# Patient Record
Sex: Male | Born: 1960
Health system: Southern US, Community
[De-identification: ages and names within clinical notes are randomized; demographics above are authoritative.]

## PROBLEM LIST (undated history)

## (undated) DIAGNOSIS — I10 Essential (primary) hypertension: Secondary | ICD-10-CM

## (undated) DIAGNOSIS — C61 Malignant neoplasm of prostate: Secondary | ICD-10-CM

## (undated) DIAGNOSIS — K449 Diaphragmatic hernia without obstruction or gangrene: Secondary | ICD-10-CM

## (undated) DIAGNOSIS — Q393 Congenital stenosis and stricture of esophagus: Secondary | ICD-10-CM

## (undated) DIAGNOSIS — K222 Esophageal obstruction: Secondary | ICD-10-CM

## (undated) HISTORY — PX: LEG AMPUTATION ABOVE KNEE: SHX117

## (undated) HISTORY — PX: PROSTATECTOMY: SHX69

## (undated) HISTORY — PX: HERNIA REPAIR: SHX51

---

## 2006-06-24 ENCOUNTER — Encounter: Admission: RE | Admit: 2006-06-24 | Discharge: 2006-06-24 | Payer: Self-pay | Admitting: Orthopedic Surgery

## 2006-07-13 ENCOUNTER — Encounter: Admission: RE | Admit: 2006-07-13 | Discharge: 2006-07-13 | Payer: Self-pay | Admitting: Orthopedic Surgery

## 2006-08-04 ENCOUNTER — Ambulatory Visit (HOSPITAL_COMMUNITY): Admission: RE | Admit: 2006-08-04 | Discharge: 2006-08-04 | Payer: Self-pay | Admitting: Orthopedic Surgery

## 2006-08-23 ENCOUNTER — Encounter: Admission: RE | Admit: 2006-08-23 | Discharge: 2006-09-30 | Payer: Self-pay | Admitting: Orthopedic Surgery

## 2006-12-07 ENCOUNTER — Encounter: Admission: RE | Admit: 2006-12-07 | Discharge: 2006-12-07 | Payer: Self-pay | Admitting: Family Medicine

## 2006-12-09 ENCOUNTER — Ambulatory Visit (HOSPITAL_COMMUNITY): Admission: RE | Admit: 2006-12-09 | Discharge: 2006-12-09 | Payer: Self-pay | Admitting: Orthopedic Surgery

## 2007-01-17 ENCOUNTER — Encounter: Admission: RE | Admit: 2007-01-17 | Discharge: 2007-01-17 | Payer: Self-pay | Admitting: Orthopedic Surgery

## 2009-04-04 ENCOUNTER — Encounter (INDEPENDENT_AMBULATORY_CARE_PROVIDER_SITE_OTHER): Payer: Self-pay | Admitting: Urology

## 2009-04-04 ENCOUNTER — Inpatient Hospital Stay (HOSPITAL_COMMUNITY): Admission: RE | Admit: 2009-04-04 | Discharge: 2009-04-06 | Payer: Self-pay | Admitting: Urology

## 2011-01-11 LAB — CBC
HCT: 42.4 % (ref 39.0–52.0)
Hemoglobin: 14.1 g/dL (ref 13.0–17.0)
RDW: 14.9 % (ref 11.5–15.5)
WBC: 4.3 10*3/uL (ref 4.0–10.5)

## 2011-01-11 LAB — HEMOGLOBIN AND HEMATOCRIT, BLOOD
HCT: 31.2 % — ABNORMAL LOW (ref 39.0–52.0)
HCT: 33.7 % — ABNORMAL LOW (ref 39.0–52.0)
HCT: 36.5 % — ABNORMAL LOW (ref 39.0–52.0)
Hemoglobin: 10.4 g/dL — ABNORMAL LOW (ref 13.0–17.0)

## 2011-01-12 LAB — COMPREHENSIVE METABOLIC PANEL
ALT: 43 U/L (ref 0–53)
AST: 35 U/L (ref 0–37)
CO2: 26 mEq/L (ref 19–32)
Chloride: 106 mEq/L (ref 96–112)
GFR calc Af Amer: >60 mL/min (ref >60)
GFR calc non Af Amer: >60 mL/min (ref >60)
Glucose, Bld: 99 mg/dL (ref 70–99)
Sodium: 140 mEq/L (ref 135–145)
Total Bilirubin: 1 mg/dL (ref 0.3–1.2)

## 2011-01-12 LAB — TYPE AND SCREEN: ABO/RH(D): A POS

## 2011-01-12 LAB — HEMOGLOBIN AND HEMATOCRIT, BLOOD: Hemoglobin: 13.9 g/dL (ref 13.0–17.0)
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
## 2011-02-20 NOTE — Discharge Summary (Signed)
NAMETEAGUE, GOYNES               ACCOUNT NO.:  [id number]   MEDICAL RECORD NO.:  [id number]          PATIENT TYPE:  INP   LOCATION:  1438                         FACILITY:  [facility]   PHYSICIAN:  Valetta Fuller, M.D.  DATE OF BIRTH:  25-Jul-1961   DATE OF ADMISSION:  04/04/2009  DATE OF DISCHARGE:  04/06/2009                               DISCHARGE SUMMARY   ADMISSION DIAGNOSIS:  Clinical stage T1c adenocarcinoma of the prostate.   DISCHARGE DIAGNOSIS:  Clinical stage T1c adenocarcinoma of the prostate.   PROCEDURES:  Robotic assisted laparoscopic radical retropubic  prostatectomy.   HISTORY AND PHYSICAL:  For full details please see admission history and  physical.  Briefly, Mr Swatzell is a 50 year old gentleman who was found  to have clinically localized adenocarcinoma of the prostate.  After  careful consideration regarding management options for treatment he  elected to proceed with surgical therapy and a robotic assisted  laparoscopic radical prostatectomy.   HOSPITAL COURSE:  On April 04, 2009 he was taken to the operating room  where he underwent the above named procedure and which he tolerated well  without complications.  Postoperatively he was able to be transferred to  a regular hospital room following recovery from anesthesia.  He was able  to begin ambulation that evening.  He remained hemodynamically stable.  His postoperative hemoglobin was 12.1.  On the morning of postoperative  day #1 his hemoglobin had slightly fallen to 10.4.  Therefore his  hemoglobin was rechecked later that afternoon and found to be stable at  11.2, suggesting the variance was dilutional.  He maintained excellent  urine output with minimal output from his pelvic drain, therefore the  pelvic drain was removed.  He was placed on a clear liquid diet and  continued to ambulate.  He was re-evaluated on the afternoon of  postoperative day #1.  His urine output and blood pressure and pulse all  remained stable.  He was able to tolerate his clear liquid diet without  nausea or vomiting.  He was having somewhat moderate to severe abdominal  distention and pain, therefore it was decided that he would stay one  more night for observation and pain control.  He was re-evaluated on the  morning of postoperative day #2 and pain had somewhat relieved.  Therefore, he was felt to be stable for discharge as he had met all  discharge criteria.   DISPOSITION:  Home.   DISCHARGE MEDICATIONS:  He was instructed to resume his regular home  medications.  In addition he was provided a prescription for Vicodin to  use for pain, told to use Colace as a stool softener.  He was also  provided a prescription for Cipro to begin 2 days prior to followup  appointment for removal of Foley catheter.   DISCHARGE INSTRUCTIONS:  He was instructed to be ambulatory, but  specifically told to refrain from any heavy lifting, strenuous activity  or driving.  He was instructed on routine Foley catheter care and told  to gradually advance his diet over the course of the next few days.  FOLLOWUP:  He will follow up in 1 week for removal of Foley catheter and  skin staples.      Delia Chimes, NP      Valetta Fuller, M.D.  Electronically Signed    MA/MEDQ  D:  04/09/2009  T:  04/09/2009  Job:  [id number]

## 2011-02-20 NOTE — Op Note (Signed)
Evan Chambers, Evan Chambers               ACCOUNT NO.:  [id number]   MEDICAL RECORD NO.:  [id number]          PATIENT TYPE:  AMB   LOCATION:  SDS                          FACILITY:  [facility]   PHYSICIAN:  Myrtie Neither, MD      DATE OF BIRTH:  December 30, 1960   DATE OF PROCEDURE:  08/04/2006  DATE OF DISCHARGE:                                 OPERATIVE REPORT   PREOPERATIVE DIAGNOSIS:  Impingement syndrome, left shoulder.   POSTOPERATIVE DIAGNOSIS:  Impingement syndrome, left shoulder.   ANESTHESIA:  General.   PROCEDURE:  Arthroscopic acromioplasty and decompression and synovectomy,  left shoulder.   The patient taken to operating room.  After given adequate preop medications  and scalenus block, left shoulder prepped with DuraPrep and draped in  sterile manner.  A 1/2 inch puncture wound made posteriorly.  Swisher rod  was placed posterior to anterior, anterior inflow water, incision was then  made.  A separate lateral incision made for the shaver.  Inspection of the  joint revealed tremendous hypertrophic overgrowth of the subacromial bursal  sac and chondromalacia changes of the subacromial surface with complete loss  of subacromial space.  With synovial shaver, complete synovectomy was done  followed by acromioplasty with use of the arthroscopic shaver.  After  adequate decompression coracohumeral ligament was also released.  Rotator  cuff itself showed some __________ but was not torn.  After adequate  decompression and synovectomy, wound closure was then done with four nylon.  Compressive dressing was applied.  The patient tolerated procedure quite  well, went to recovery room in stable and satisfactory position.  The  patient being discharged home to return to office in one week.  The patient  being discharged on Percocet 5 mg one q. 4 p.r.n. for pain, ice packs, use  of immobilizing sling and to return to the office in one week.  The patient  discharged in stable and satisfactory  condition.      Myrtie Neither, MD  Electronically Signed     AC/MEDQ  D:  08/04/2006  T:  08/04/2006  Job:  [id number]

## 2012-01-08 ENCOUNTER — Emergency Department (HOSPITAL_BASED_OUTPATIENT_CLINIC_OR_DEPARTMENT_OTHER)
Admission: EM | Admit: 2012-01-08 | Discharge: 2012-01-08 | Disposition: A | Discharge status: Home / Self Care | Payer: Worker's Compensation | Attending: Emergency Medicine | Admitting: Emergency Medicine

## 2012-01-08 ENCOUNTER — Encounter (HOSPITAL_BASED_OUTPATIENT_CLINIC_OR_DEPARTMENT_OTHER): Payer: Self-pay | Admitting: *Deleted

## 2012-01-08 DIAGNOSIS — S88119A Complete traumatic amputation at level between knee and ankle, unspecified lower leg, initial encounter: Secondary | ICD-10-CM | POA: Insufficient documentation

## 2012-01-08 DIAGNOSIS — S0990XA Unspecified injury of head, initial encounter: Secondary | ICD-10-CM | POA: Insufficient documentation

## 2012-01-08 DIAGNOSIS — W208XXA Other cause of strike by thrown, projected or falling object, initial encounter: Secondary | ICD-10-CM | POA: Insufficient documentation

## 2012-01-08 DIAGNOSIS — Y990 Civilian activity done for income or pay: Secondary | ICD-10-CM | POA: Insufficient documentation

## 2012-01-08 DIAGNOSIS — I10 Essential (primary) hypertension: Secondary | ICD-10-CM | POA: Insufficient documentation

## 2012-01-08 HISTORY — DX: Essential (primary) hypertension: I10

## 2012-01-08 MED ORDER — MECLIZINE HCL 25 MG PO TABS
25.0000 mg | ORAL_TABLET | Freq: Once | ORAL | Status: AC
  Administered 2012-01-08: 25 mg via ORAL
  Filled 2012-01-08: qty 1
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
## 2012-01-08 NOTE — Discharge Instructions (Signed)
Head Injury, Adult  You have had a head injury that does not appear serious at this time. A concussion is a state of changed mental ability, usually from a blow to the head. You should take clear liquids for the rest of the day and then resume your regular diet. You should not take sedatives or alcoholic beverages for as long as directed by your caregiver after discharge. After injuries such as yours, most problems occur within the first 24 hours.  SYMPTOMS  These minor symptoms may be experienced after discharge:  · Memory difficulties.  · Dizziness.  · Headaches.  · Double vision.  · Hearing difficulties.  · Depression.  · Tiredness.  · Weakness.  · Difficulty with concentration.  If you experience any of these problems, you should not be alarmed. A concussion requires a few days for recovery. Many patients with head injuries frequently experience such symptoms. Usually, these problems disappear without medical care. If symptoms last for more than one day, notify your caregiver. See your caregiver sooner if symptoms are becoming worse rather than better.  HOME CARE INSTRUCTIONS   · During the next 24 hours you must stay with someone who can watch you for the warning signs listed below.  Although it is unlikely that serious side effects will occur, you should be aware of signs and symptoms which may necessitate your return to this location. Side effects may occur up to 7 - 10 days following the injury. It is important for you to carefully monitor your condition and contact your caregiver or seek immediate medical attention if there is a change in your condition.  SEEK IMMEDIATE MEDICAL CARE IF:   · There is confusion or drowsiness.  · You can not awaken the injured person.  · There is nausea (feeling sick to your stomach) or continued, forceful vomiting.  · You notice dizziness or unsteadiness which is getting worse, or inability to walk.  · You have convulsions or unconsciousness.  · You experience severe,  persistent headaches not relieved by over-the-counter or prescription medicines for pain. (Do not take aspirin as this impairs clotting abilities). Take other pain medications only as directed.  · You can not use arms or legs normally.  · There is clear or bloody discharge from the nose or ears.  MAKE SURE YOU:   · Understand these instructions.  · Will watch your condition.  · Will get help right away if you are not doing well or get worse.  Document Released: 09/21/2005 Document Revised: 09/10/2011 Document Reviewed: 08/09/2009  [employer]® Patient Information ©2012 [employer].

## 2012-01-08 NOTE — ED Notes (Signed)
UDS for workers comp completed

## 2014-05-03 ENCOUNTER — Ambulatory Visit
Admission: RE | Admit: 2014-05-03 | Discharge: 2014-05-03 | Disposition: A | Discharge status: Home / Self Care | Payer: BC Managed Care – PPO | Source: Ambulatory Visit | Attending: Orthopedic Surgery | Admitting: Orthopedic Surgery

## 2014-05-03 ENCOUNTER — Other Ambulatory Visit: Payer: Self-pay | Admitting: Orthopedic Surgery

## 2014-05-03 DIAGNOSIS — M25512 Pain in left shoulder: Secondary | ICD-10-CM

## 2014-05-03 DIAGNOSIS — R609 Edema, unspecified: Secondary | ICD-10-CM

## 2014-05-03 DIAGNOSIS — M25561 Pain in right knee: Secondary | ICD-10-CM

## 2014-05-03 DIAGNOSIS — M5412 Radiculopathy, cervical region: Secondary | ICD-10-CM

## 2014-05-17 ENCOUNTER — Other Ambulatory Visit: Payer: Self-pay | Admitting: Orthopedic Surgery

## 2014-05-17 DIAGNOSIS — M542 Cervicalgia: Secondary | ICD-10-CM

## 2014-05-24 ENCOUNTER — Ambulatory Visit
Admission: RE | Admit: 2014-05-24 | Discharge: 2014-05-24 | Disposition: A | Discharge status: Home / Self Care | Payer: BC Managed Care – PPO | Source: Ambulatory Visit | Attending: Orthopedic Surgery | Admitting: Orthopedic Surgery

## 2014-05-24 DIAGNOSIS — M542 Cervicalgia: Secondary | ICD-10-CM

## 2016-11-14 ENCOUNTER — Encounter (HOSPITAL_COMMUNITY): Payer: Self-pay | Admitting: *Deleted

## 2016-11-14 ENCOUNTER — Emergency Department (HOSPITAL_COMMUNITY): Payer: Medicare Other

## 2016-11-14 ENCOUNTER — Observation Stay (HOSPITAL_COMMUNITY)
Admission: EM | Admit: 2016-11-14 | Discharge: 2016-11-15 | Disposition: A | Discharge status: Home / Self Care | Payer: Medicare Other | Attending: Internal Medicine | Admitting: Internal Medicine

## 2016-11-14 DIAGNOSIS — I10 Essential (primary) hypertension: Secondary | ICD-10-CM | POA: Diagnosis not present

## 2016-11-14 DIAGNOSIS — R780 Finding of alcohol in blood: Secondary | ICD-10-CM

## 2016-11-14 DIAGNOSIS — Y906 Blood alcohol level of 120-199 mg/100 ml: Secondary | ICD-10-CM

## 2016-11-14 DIAGNOSIS — E876 Hypokalemia: Secondary | ICD-10-CM

## 2016-11-14 DIAGNOSIS — R079 Chest pain, unspecified: Secondary | ICD-10-CM | POA: Diagnosis not present

## 2016-11-14 DIAGNOSIS — R0602 Shortness of breath: Secondary | ICD-10-CM | POA: Diagnosis not present

## 2016-11-14 DIAGNOSIS — R739 Hyperglycemia, unspecified: Secondary | ICD-10-CM | POA: Diagnosis not present

## 2016-11-14 DIAGNOSIS — Z8546 Personal history of malignant neoplasm of prostate: Secondary | ICD-10-CM

## 2016-11-14 LAB — CBC
HEMATOCRIT: 37.9 % — AB (ref 39.0–52.0)
Hemoglobin: 13 g/dL (ref 13.0–17.0)
MCH: 30.1 pg (ref 26.0–34.0)
MCHC: 34.3 g/dL (ref 30.0–36.0)
MCV: 87.7 fL (ref 78.0–100.0)
PLATELETS: 154 10*3/uL (ref 150–400)
RBC: 4.32 MIL/uL (ref 4.22–5.81)
RDW: 15.4 % (ref 11.5–15.5)
WBC: 5.6 10*3/uL (ref 4.0–10.5)

## 2016-11-14 LAB — D-DIMER, QUANTITATIVE: D-Dimer, Quant: <0.27 ug/mL-FEU (ref 0.00–0.50)

## 2016-11-14 LAB — BASIC METABOLIC PANEL
Anion gap: 18 — ABNORMAL HIGH (ref 5–15)
BUN: 5 mg/dL — AB (ref 6–20)
CHLORIDE: 99 mmol/L — AB (ref 101–111)
CO2: 20 mmol/L — AB (ref 22–32)
CREATININE: 1.05 mg/dL (ref 0.61–1.24)
Calcium: 9.2 mg/dL (ref 8.9–10.3)
GFR calc Af Amer: >60 mL/min (ref >60)
GFR calc non Af Amer: >60 mL/min (ref >60)
Glucose, Bld: 160 mg/dL — ABNORMAL HIGH (ref 65–99)
POTASSIUM: 3 mmol/L — AB (ref 3.5–5.1)
SODIUM: 137 mmol/L (ref 135–145)

## 2016-11-14 LAB — URINALYSIS, ROUTINE W REFLEX MICROSCOPIC
BILIRUBIN URINE: NEGATIVE
Glucose, UA: NEGATIVE mg/dL
HGB URINE DIPSTICK: NEGATIVE
KETONES UR: NEGATIVE mg/dL
Leukocytes, UA: NEGATIVE
NITRITE: NEGATIVE
PROTEIN: NEGATIVE mg/dL
SPECIFIC GRAVITY, URINE: 1.01 (ref 1.005–1.030)
pH: 6 (ref 5.0–8.0)

## 2016-11-14 LAB — LIPID PANEL
CHOL/HDL RATIO: 3 ratio
Cholesterol: 236 mg/dL — ABNORMAL HIGH (ref 0–200)
HDL: 80 mg/dL (ref >40)
LDL Cholesterol: 141 mg/dL — ABNORMAL HIGH (ref 0–99)
Triglycerides: 74 mg/dL (ref <150)
VLDL: 15 mg/dL (ref 0–40)

## 2016-11-14 LAB — TROPONIN I
Troponin I: <0.03 ng/mL (ref <0.03)
Troponin I: <0.03 ng/mL (ref <0.03)
Troponin I: <0.03 ng/mL (ref <0.03)

## 2016-11-14 LAB — ETHANOL: ALCOHOL ETHYL (B): 138 mg/dL — AB (ref <5)

## 2016-11-14 MED ORDER — LORAZEPAM 1 MG PO TABS: 1.0000 mg | ORAL_TABLET | Freq: Four times a day (QID) | ORAL | Status: DC | PRN

## 2016-11-14 MED ORDER — MORPHINE SULFATE (PF) 4 MG/ML IV SOLN: 1.0000 mg | INTRAVENOUS | Status: DC | PRN

## 2016-11-14 MED ORDER — ASPIRIN 81 MG PO CHEW
324.0000 mg | CHEWABLE_TABLET | Freq: Once | ORAL | Status: AC
  Administered 2016-11-14: 324 mg via ORAL
  Filled 2016-11-14: qty 4

## 2016-11-14 MED ORDER — NITROGLYCERIN 0.4 MG SL SUBL
0.4000 mg | SUBLINGUAL_TABLET | SUBLINGUAL | Status: DC | PRN
  Administered 2016-11-14: 0.4 mg via SUBLINGUAL
  Filled 2016-11-14: qty 1

## 2016-11-14 MED ORDER — ADULT MULTIVITAMIN W/MINERALS CH
1.0000 | ORAL_TABLET | Freq: Every day | ORAL | Status: DC
  Administered 2016-11-14 – 2016-11-15 (×2): 1 via ORAL
  Filled 2016-11-14 (×2): qty 1

## 2016-11-14 MED ORDER — VITAMIN B-1 100 MG PO TABS
100.0000 mg | ORAL_TABLET | Freq: Every day | ORAL | Status: DC
  Administered 2016-11-14 – 2016-11-15 (×2): 100 mg via ORAL
  Filled 2016-11-14 (×2): qty 1

## 2016-11-14 MED ORDER — SODIUM CHLORIDE 0.9 % IV BOLUS (SEPSIS)
1000.0000 mL | Freq: Once | INTRAVENOUS | Status: AC
  Administered 2016-11-14: 1000 mL via INTRAVENOUS

## 2016-11-14 MED ORDER — ASPIRIN EC 325 MG PO TBEC: 325.0000 mg | DELAYED_RELEASE_TABLET | Freq: Every day | ORAL | Status: DC

## 2016-11-14 MED ORDER — INFLUENZA VAC SPLIT QUAD 0.5 ML IM SUSY
0.5000 mL | PREFILLED_SYRINGE | INTRAMUSCULAR | Status: AC
  Administered 2016-11-15: 0.5 mL via INTRAMUSCULAR
  Filled 2016-11-14: qty 0.5

## 2016-11-14 MED ORDER — POTASSIUM CHLORIDE CRYS ER 20 MEQ PO TBCR
40.0000 meq | EXTENDED_RELEASE_TABLET | Freq: Once | ORAL | Status: AC
  Administered 2016-11-14: 40 meq via ORAL
  Filled 2016-11-14: qty 2

## 2016-11-14 MED ORDER — THIAMINE HCL 100 MG/ML IJ SOLN: 100.0000 mg | Freq: Every day | INTRAMUSCULAR | Status: DC

## 2016-11-14 MED ORDER — ASPIRIN 81 MG PO CHEW
324.0000 mg | CHEWABLE_TABLET | Freq: Every day | ORAL | Status: DC
  Administered 2016-11-14 – 2016-11-15 (×2): 324 mg via ORAL
  Filled 2016-11-14 (×3): qty 4

## 2016-11-14 MED ORDER — ONDANSETRON HCL 4 MG/2ML IJ SOLN: 4.0000 mg | Freq: Four times a day (QID) | INTRAMUSCULAR | Status: DC | PRN

## 2016-11-14 MED ORDER — ENOXAPARIN SODIUM 40 MG/0.4ML ~~LOC~~ SOLN
40.0000 mg | SUBCUTANEOUS | Status: DC
  Administered 2016-11-14: 40 mg via SUBCUTANEOUS
  Filled 2016-11-14: qty 0.4

## 2016-11-14 MED ORDER — GI COCKTAIL ~~LOC~~: 30.0000 mL | Freq: Four times a day (QID) | ORAL | Status: DC | PRN

## 2016-11-14 MED ORDER — FAMOTIDINE 20 MG PO TABS
40.0000 mg | ORAL_TABLET | Freq: Once | ORAL | Status: AC
  Administered 2016-11-14: 40 mg via ORAL
  Filled 2016-11-14: qty 2

## 2016-11-14 MED ORDER — FOLIC ACID 1 MG PO TABS
1.0000 mg | ORAL_TABLET | Freq: Every day | ORAL | Status: DC
  Administered 2016-11-14 – 2016-11-15 (×2): 1 mg via ORAL
  Filled 2016-11-14 (×2): qty 1

## 2016-11-14 MED ORDER — ACETAMINOPHEN 325 MG PO TABS: 650.0000 mg | ORAL_TABLET | ORAL | Status: DC | PRN

## 2016-11-14 MED ORDER — LORAZEPAM 2 MG/ML IJ SOLN: 1.0000 mg | Freq: Four times a day (QID) | INTRAMUSCULAR | Status: DC | PRN

## 2016-11-14 MED ORDER — PROMETHAZINE HCL 25 MG/ML IJ SOLN: 12.5000 mg | Freq: Four times a day (QID) | INTRAMUSCULAR | Status: DC | PRN

## 2016-11-14 NOTE — H&P (Signed)
History and Physical    DAMASCUS BLOT [id number] DOB: [birth year] DOA: 11/14/2016   PCP: Elyn Peers, MD   Patient coming from:  Home    Chief Complaint: Chest pain   HPI: Evan Chambers is a 56 y.o. male with medical history significant for HTN presenting to the ED with intermittent, lower sternal chest pain, beginning its onset on 11/13/2016 in the afternoon. He describes this episodes as moderate severe, lasting about 2 minutes, nonradiating. This is unchanged with eating or with movement. He denies any nausea vomiting, or diaphoresis. He denies any left jaw or left arm pain. He denies any pain with deep inspiration. He denies any dizziness or falls. No syncope or presyncope. He has some acute dyspnea with this chest pain, but without cough. He denies any fever or chills or sick contacts. His appetite is normal, but he admits to eating salt rich foods. He denies any right leg swelling or calf pain ( LAKA) . He denies any headaches or vision changes. No seizures. No confusion is reported. He was never seen by a cardiologist, or had a cardiac catheterization. He denies any recent long distance trips, new stressors, or new medicines. He is not on hormonal therapy or herbal supplements. He does not smoke. He does admit to being a social drinker. His last episode was yesterday around 4:56 PM, drinking a few beers. He denies any recreational drug use.   ED Course:  BP 135/95   Pulse 99   Temp 99.1 F (37.3 C) (Oral)   Resp 17   Ht 5\' 6"  (1.676 m)   Wt 72.6 kg (160 lb)   SpO2 98%   BMI 25.82 kg/m     sodium 137 potassium 3.0 creatinine 1.05  troponin less than 0.03  white count 5.6 hemoglobin 13 platelets 150  D dimer less than 0.27  glucose 160  alcohol 138  chest x-ray NAD EKG sinus rhythm  QTc 589, likely artifact  Received 2 NTG and 1 ASA with pain relief. He is pain free at this time   Review of Systems: As per HPI otherwise 10 point review of systems negative.   Past  Medical History:  Diagnosis Date  . Hypertension     Past Surgical History:  Procedure Laterality Date  . HERNIA REPAIR    . LEG AMPUTATION ABOVE KNEE    . PROSTATECTOMY      Social History Social History   Social History  . Marital status: Single    Spouse name: N/A  . Number of children: N/A  . Years of education: N/A   Occupational History  . unemployed    Social History Main Topics  . Smoking status: Never Smoker  . Smokeless tobacco: Never Used  . Alcohol use Yes     Comment: drinks socially   . Drug use: No  . Sexual activity: Yes   Other Topics Concern  . Not on file   Social History Narrative  . No narrative on file     No Known Allergies  Family History  Problem Relation Age of Onset  . Hypertension Mother   . Hypertension Father       Prior to Admission medications   Not on File    Physical Exam:  Vitals:   11/14/16 0410 11/14/16 0430 11/14/16 0515 11/14/16 0545  BP:  145/99 148/87 135/95  Pulse:  110 95 99  Resp:  11 13 17   Temp: 99.1 F (37.3 C)     TempSrc:  Oral     SpO2:  97% 98% 98%  Weight:      Height:       Constitutional: NAD, calm, comfortable   Eyes: PERRL, lids and conjunctivae normal ENMT: Mucous membranes are moist, without exudate or lesions  Neck: normal, supple, no masses, no thyromegaly Respiratory: clear to auscultation bilaterally, no wheezing, no crackles. Normal respiratory effort  Cardiovascular: Regular rate and rhythm, no murmurs / rubs / gallops. No extremity edema on the R . 2+ pedal pulses. No carotid bruits.  Abdomen: Soft, non tender, No hepatosplenomegaly. Bowel sounds positive.  Musculoskeletal: no clubbing / cyanosis. Moves all extremities. L AKA  Skin: no jaundice, No lesions.  Neurologic: Sensation intact  Strength normal  Psychiatric:   Alert and oriented x 3. Normal mood.     Labs on Admission: I have personally reviewed following labs and imaging studies  CBC:  Recent Labs Lab  11/14/16 0326  WBC 5.6  HGB 13.0  HCT 37.9*  MCV 87.7  PLT 123456    Basic Metabolic Panel:  Recent Labs Lab 11/14/16 0326  NA 137  K 3.0*  CL 99*  CO2 20*  GLUCOSE 160*  BUN 5*  CREATININE 1.05  CALCIUM 9.2    GFR: Estimated Creatinine Clearance: 71.7 mL/min (by C-G formula based on SCr of 1.05 mg/dL).  Liver Function Tests: No results for input(s): AST, ALT, ALKPHOS, BILITOT, PROT, ALBUMIN in the last 168 hours. No results for input(s): LIPASE, AMYLASE in the last 168 hours. No results for input(s): AMMONIA in the last 168 hours.  Coagulation Profile: No results for input(s): INR, PROTIME in the last 168 hours.  Cardiac Enzymes:  Recent Labs Lab 11/14/16 0326  TROPONINI <0.03    BNP (last 3 results) No results for input(s): PROBNP in the last 8760 hours.  HbA1C: No results for input(s): HGBA1C in the last 72 hours.  CBG: No results for input(s): GLUCAP in the last 168 hours.  Lipid Profile: No results for input(s): CHOL, HDL, LDLCALC, TRIG, CHOLHDL, LDLDIRECT in the last 72 hours.  Thyroid Function Tests: No results for input(s): TSH, T4TOTAL, FREET4, T3FREE, THYROIDAB in the last 72 hours.  Anemia Panel: No results for input(s): VITAMINB12, FOLATE, FERRITIN, TIBC, IRON, RETICCTPCT in the last 72 hours.  Urine analysis: No results found for: COLORURINE, APPEARANCEUR, LABSPEC, PHURINE, GLUCOSEU, HGBUR, BILIRUBINUR, KETONESUR, PROTEINUR, UROBILINOGEN, NITRITE, LEUKOCYTESUR  Sepsis Labs: @LABRCNTIP (procalcitonin:4,lacticidven:4) )No results found for this or any previous visit (from the past 240 hour(s)).   Radiological Exams on Admission: Dg Chest 2 View  Result Date: 11/14/2016 CLINICAL DATA:  Shortness of breath since this afternoon. History of hypertension. Chest pain for 1 hour. Nonsmoker. EXAM: CHEST  2 VIEW COMPARISON:  04/02/2009 FINDINGS: The heart size and mediastinal contours are within normal limits. Both lungs are clear. The visualized  skeletal structures are unremarkable. IMPRESSION: No active cardiopulmonary disease. Electronically Signed   By: Lucienne Capers M.D.   On: 11/14/2016 04:06    EKG: Independently reviewed.  Assessment/Plan Active Problems:   Chest pain   Elevated ETOH level   Hypokalemia   Elevated blood sugar level   Personal history of prostate cancer    Chest pain syndrome  HEART score 3. Troponin neg , EKG SR  K being corrected, receiving 40 meq at the ED po. CP nrelieved by nitroglycerin, aspirin. CXR unrevealing.   Risk factors include HTN, age. No family history of cardiac disease. He is chest pain free at this time   Admit  to Telemetry/ Observation Chest pain order set Cycle troponins EKG in am continue ASA, O2 and NTG as needed GI cocktail Check Lipid panel  Hb A1C Patient may need further evaluation as outpatien with Cards.   Prolonged QTc: likely artifact  No arrhythmias no telemetry.  Serial EKG    Alcohol intoxication, last level at 138 after social drinking. Denies h/o ETOH abuse     -  CIWA with Ativan per protocol -  Thiamine, folate, and MVI   Hypokalemia, may be due to increased diuresis after ETOH consumption,  EKG SR,  Initial K 3, received replenishment at the ED with 40 meq   Oral replenishment as needed Repeat CMET in am   Elevated blood sugar level is 160, no h/o DM  No results found for: HGBA1C Hgb A1C.  History of prostate Ca  Gleason 3 (95%) + 4 (5%) = 7, pT2c, pNX, pMX , dx 2010  Not on therapy   DVT prophylaxis: Lovenox   Code Status:   Full     Family Communication:  Discussed with patient Disposition Plan: Expect patient to be discharged to home after condition improves Consults called:    None Admission status:Tele  Obs   Lailani Tool E, PA-C [facility]   11/14/2016, 7:54 AM

## 2016-11-14 NOTE — ED Provider Notes (Signed)
[facility] DEPT Provider Note   CSN: [id number] Arrival date & time: 11/14/16  [id number] By signing my name below, I, Evan Chambers, attest that this documentation has been prepared under the direction and in the presence of Evan Gambler, MD . Electronically Signed: Dyke Chambers, Scribe. 11/14/2016. 3:57 AM.  History   Chief Complaint Chief Complaint  Patient presents with  . Chest Pain    HPI Evan Chambers is a 56 y.o. male with a hx of HTN who presents to the Emergency Department complaining of gradually worsening, intermittent, central chest pain onset yesterday afternoon. Pt describes his pain as 7/10, pinching chest pain that does not radiate and is unchanged by eating or with movement. He notes associated mild SOB. He denies any tobacco or drug use, but endorses occasional alcohol use "on the weekends". Per pt, he had a few shots at 4-5 pm yesterday. No personal or family cardiac history. No PMHx of blood clots. No recent illness. Pt denies any leg swelling, nausea, vomiting, or any other associated symptoms.   The history is provided by the patient. No language interpreter was used.    Past Medical History:  Diagnosis Date  . Hypertension     Patient Active Problem List   Diagnosis Date Noted  . Chest pain 11/14/2016    Past Surgical History:  Procedure Laterality Date  . HERNIA REPAIR    . LEG AMPUTATION ABOVE KNEE    . PROSTATECTOMY      Home Medications    Prior to Admission medications   Not on File   Family History No family history on file.  Social History Social History  Substance Use Topics  . Smoking status: Never Smoker  . Smokeless tobacco: Never Used  . Alcohol use Yes     Allergies   Patient has no known allergies.   Review of Systems Review of Systems  Cardiovascular: Positive for chest pain. Negative for leg swelling.  Gastrointestinal: Negative for nausea and vomiting.  All other systems reviewed and are negative.  Physical  Exam Updated Vital Signs BP 135/95   Pulse 99   Temp 99.1 F (37.3 C) (Oral)   Resp 17   Ht 5\' 6"  (1.676 m)   Wt 160 lb (72.6 kg)   SpO2 98%   BMI 25.82 kg/m   Physical Exam  Constitutional: He is oriented to person, place, and time. He appears well-developed and well-nourished.  HENT:  Head: Normocephalic and atraumatic.  Right Ear: External ear normal.  Left Ear: External ear normal.  Nose: Nose normal.  Eyes: Right eye exhibits no discharge. Left eye exhibits no discharge.  Neck: Neck supple.  Cardiovascular: Regular rhythm and normal heart sounds.  Tachycardia present.   Pulses:      Radial pulses are 2+ on the right side, and 2+ on the left side.  Pulmonary/Chest: Effort normal and breath sounds normal.  No chest tenderness.  Abdominal: Soft. There is no tenderness.  Musculoskeletal: He exhibits no edema.  Neurological: He is alert and oriented to person, place, and time.  Skin: Skin is warm and dry.  Nursing note and vitals reviewed.  ED Treatments / Results  DIAGNOSTIC STUDIES:  Oxygen Saturation is 98% on RA, normal by my interpretation.    COORDINATION OF CARE:  3:54 AM Discussed treatment plan with pt at bedside and pt agreed to plan.   Labs (all labs ordered are listed, but only abnormal results are displayed) Labs Reviewed  BASIC METABOLIC PANEL - Abnormal; Notable  for the following:       Result Value   Potassium 3.0 (*)    Chloride 99 (*)    CO2 20 (*)    Glucose, Bld 160 (*)    BUN 5 (*)    Anion gap 18 (*)    All other components within normal limits  CBC - Abnormal; Notable for the following:    HCT 37.9 (*)    All other components within normal limits  ETHANOL - Abnormal; Notable for the following:    Alcohol, Ethyl (B) 138 (*)    All other components within normal limits  TROPONIN I  D-DIMER, QUANTITATIVE (NOT AT [facility])    EKG  EKG Interpretation  Date/Time:  Saturday November 14 2016 03:19:41 EST Ventricular Rate:  134 PR  Interval:  148 QRS Duration: 70 QT Interval:  292 QTC Calculation: 436 R Axis:   44 Text Interpretation:  Sinus tachycardia Septal infarct , age undetermined Abnormal ECG ST/T changes, probably rate related changes since 2007 Confirmed by Traci Plemons MD, Chioke Noxon [phone]) on 11/14/2016 3:30:44 AM       EKG Interpretation  Date/Time:  Saturday November 14 2016 05:54:40 EST Ventricular Rate:  99 PR Interval:  148 QRS Duration: 68 QT Interval:  442 QTC Calculation: 568 R Axis:   39 Text Interpretation:  Sinus rhythm Probable anteroseptal infarct, old Prolonged QT interval nonspecific T wave flattening tachycardia resolved from earlier in the night Confirmed by Evan Maciolek MD, Evan Chambers [phone]) on 11/14/2016 6:16:37 AM        Radiology Dg Chest 2 View  Result Date: 11/14/2016 CLINICAL DATA:  Shortness of breath since this afternoon. History of hypertension. Chest pain for 1 hour. Nonsmoker. EXAM: CHEST  2 VIEW COMPARISON:  04/02/2009 FINDINGS: The heart size and mediastinal contours are within normal limits. Both lungs are clear. The visualized skeletal structures are unremarkable. IMPRESSION: No active cardiopulmonary disease. Electronically Signed   By: Evan Chambers M.D.   On: 11/14/2016 04:06    Procedures Procedures (including critical care time)  Medications Ordered in ED Medications  nitroGLYCERIN (NITROSTAT) SL tablet 0.4 mg (0.4 mg Sublingual Given 11/14/16 0411)  sodium chloride 0.9 % bolus 1,000 mL (1,000 mLs Intravenous New Bag/Given 11/14/16 0430)  sodium chloride 0.9 % bolus 1,000 mL (0 mLs Intravenous Stopped 11/14/16 0545)  aspirin chewable tablet 324 mg (324 mg Oral Given 11/14/16 0411)  potassium chloride SA (K-DUR,KLOR-CON) CR tablet 40 mEq (40 mEq Oral Given 11/14/16 0559)     Initial Impression / Assessment and Plan / ED Course  I have reviewed the triage vital signs and the nursing notes.  Pertinent labs & imaging results that were available during my care of the patient  were reviewed by me and considered in my medical decision making (see chart for details).     Patient's chest pain is atypical. Resolved after aspirin and one nitroglycerin. After IV fluids his tachycardia has now improved and his heart rate is in the 90s. ECG repeated and shows nonspecific T-wave flattening that is new from prior. Given the tachycardia d-dimer was sent but is negative. I think PE is less likely with no known risk factors. However he does have hypertension, nonspecific T-wave changes, and his age and hypertension. His HEART score is a 4. Will admit to the hospitalist for ACS rule out. I think the most likely cause of his mild anion gap and bicarbonate of 20 is from alcohol use and some dehydration. Given oral potassium supplementation.  Final Clinical Impressions(s) /  ED Diagnoses   Final diagnoses:  Nonspecific chest pain    New Prescriptions New Prescriptions   No medications on file   I personally performed the services described in this documentation, which was scribed in my presence. The recorded information has been reviewed and is accurate.    Evan Gambler, MD 11/14/16 [phone]

## 2016-11-14 NOTE — ED Triage Notes (Signed)
The pt is c/o chest pain for one hour with sob  He has been drinkikng alcohol today very red in the face and hes nervous and juttery  He drove himself over here

## 2016-11-14 NOTE — ED Notes (Signed)
Patient transported to X-ray 

## 2016-11-15 ENCOUNTER — Other Ambulatory Visit: Payer: Self-pay | Admitting: Physician Assistant

## 2016-11-15 DIAGNOSIS — I10 Essential (primary) hypertension: Secondary | ICD-10-CM | POA: Diagnosis not present

## 2016-11-15 DIAGNOSIS — Y906 Blood alcohol level of 120-199 mg/100 ml: Secondary | ICD-10-CM | POA: Diagnosis not present

## 2016-11-15 DIAGNOSIS — R079 Chest pain, unspecified: Secondary | ICD-10-CM | POA: Diagnosis not present

## 2016-11-15 LAB — COMPREHENSIVE METABOLIC PANEL
ALBUMIN: 4.1 g/dL (ref 3.5–5.0)
ALT: 20 U/L (ref 17–63)
AST: 42 U/L — AB (ref 15–41)
Alkaline Phosphatase: 51 U/L (ref 38–126)
Anion gap: 9 (ref 5–15)
BUN: 5 mg/dL — AB (ref 6–20)
CHLORIDE: 102 mmol/L (ref 101–111)
CO2: 27 mmol/L (ref 22–32)
Calcium: 9.5 mg/dL (ref 8.9–10.3)
Creatinine, Ser: 1.05 mg/dL (ref 0.61–1.24)
GFR calc Af Amer: >60 mL/min (ref >60)
GFR calc non Af Amer: >60 mL/min (ref >60)
GLUCOSE: 100 mg/dL — AB (ref 65–99)
POTASSIUM: 3.7 mmol/L (ref 3.5–5.1)
Sodium: 138 mmol/L (ref 135–145)
Total Bilirubin: 2 mg/dL — ABNORMAL HIGH (ref 0.3–1.2)
Total Protein: 7.3 g/dL (ref 6.5–8.1)

## 2016-11-15 LAB — CBC
HCT: 36.7 % — ABNORMAL LOW (ref 39.0–52.0)
Hemoglobin: 12.2 g/dL — ABNORMAL LOW (ref 13.0–17.0)
MCH: 29.3 pg (ref 26.0–34.0)
MCHC: 33.2 g/dL (ref 30.0–36.0)
MCV: 88 fL (ref 78.0–100.0)
PLATELETS: 144 10*3/uL — AB (ref 150–400)
RBC: 4.17 MIL/uL — ABNORMAL LOW (ref 4.22–5.81)
RDW: 15.3 % (ref 11.5–15.5)
WBC: 5.2 10*3/uL (ref 4.0–10.5)

## 2016-11-15 MED ORDER — POTASSIUM CHLORIDE CRYS ER 20 MEQ PO TBCR
40.0000 meq | EXTENDED_RELEASE_TABLET | Freq: Once | ORAL | Status: AC
  Administered 2016-11-15: 40 meq via ORAL
  Filled 2016-11-15: qty 2

## 2016-11-15 MED ORDER — CARVEDILOL 3.125 MG PO TABS: 3.1250 mg | ORAL_TABLET | Freq: Two times a day (BID) | ORAL | 1 refills | Status: DC

## 2016-11-15 MED ORDER — HYDROCHLOROTHIAZIDE 25 MG PO TABS: 25.0000 mg | ORAL_TABLET | Freq: Every day | ORAL | 1 refills | Status: DC

## 2016-11-15 MED ORDER — POTASSIUM CHLORIDE CRYS ER 10 MEQ PO TBCR: 10.0000 meq | EXTENDED_RELEASE_TABLET | Freq: Every day | ORAL | Status: DC

## 2016-11-15 MED ORDER — POTASSIUM CHLORIDE CRYS ER 10 MEQ PO TBCR: 10.0000 meq | EXTENDED_RELEASE_TABLET | Freq: Every day | ORAL | 0 refills | Status: DC

## 2016-11-15 MED ORDER — CARVEDILOL 3.125 MG PO TABS
3.1250 mg | ORAL_TABLET | Freq: Two times a day (BID) | ORAL | Status: DC
  Administered 2016-11-15: 3.125 mg via ORAL
  Filled 2016-11-15: qty 1

## 2016-11-15 MED ORDER — HYDROCHLOROTHIAZIDE 25 MG PO TABS
25.0000 mg | ORAL_TABLET | Freq: Every day | ORAL | Status: DC
  Administered 2016-11-15: 25 mg via ORAL
  Filled 2016-11-15: qty 1

## 2016-11-15 MED ORDER — POTASSIUM CHLORIDE CRYS ER 20 MEQ PO TBCR: 40.0000 meq | EXTENDED_RELEASE_TABLET | Freq: Once | ORAL | Status: DC
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
## 2016-11-15 NOTE — Discharge Instructions (Addendum)
Chest Wall Pain Chest wall pain is pain in or around the bones and muscles of your chest. Sometimes, an injury causes this pain. Sometimes, the cause may not be known. This pain may take several weeks or longer to get better. Follow these instructions at home: Pay attention to any changes in your symptoms. Take these actions to help with your pain:  Rest as told by your health care provider.  Avoid activities that cause pain. These include any activities that use your chest muscles or your abdominal and side muscles to lift heavy items.  If directed, apply ice to the painful area:  Put ice in a plastic bag.  Place a towel between your skin and the bag.  Leave the ice on for 20 minutes, 2-3 times per day.  Take over-the-counter and prescription medicines only as told by your health care provider.  Do not use tobacco products, including cigarettes, chewing tobacco, and e-cigarettes. If you need help quitting, ask your health care provider.  Keep all follow-up visits as told by your health care provider. This is important. Contact a health care provider if:  You have a fever.  Your chest pain becomes worse.  You have new symptoms. Get help right away if:  You have nausea or vomiting.  You feel sweaty or light-headed.  You have a cough with phlegm (sputum) or you cough up blood.  You develop shortness of breath. This information is not intended to replace advice given to you by your health care provider. Make sure you discuss any questions you have with your health care provider. Document Released: 09/21/2005 Document Revised: 01/30/2016 Document Reviewed: 12/17/2014 Elsevier Interactive Patient Education  2017 [place]. Follow with Evan Peers, MD in 5-7 days  Please get a complete blood count and chemistry panel checked by your Primary MD at your next visit, and again as instructed by your Primary MD. Please get your medications reviewed and adjusted by your  Primary MD.  Please request your Primary MD to go over all Hospital Tests and Procedure/Radiological results at the follow up, please get all Hospital records sent to your Prim MD by signing hospital release before you go home.  If you had Pneumonia of Lung problems at the Hospital: Please get a 2 view Chest X ray done in 6-8 weeks after hospital discharge or sooner if instructed by your Primary MD.  If you have Congestive Heart Failure: Please call your Cardiologist or Primary MD anytime you have any of the following symptoms:  1) 3 pound weight gain in 24 hours or 5 pounds in 1 week  2) shortness of breath, with or without a dry hacking cough  3) swelling in the hands, feet or stomach  4) if you have to sleep on extra pillows at night in order to breathe  Follow cardiac low salt diet and 1.5 lit/day fluid restriction.  If you have diabetes Accuchecks 4 times/day, Once in AM empty stomach and then before each meal. Log in all results and show them to your primary doctor at your next visit. If any glucose reading is under 80 or above 300 call your primary MD immediately.  If you have Seizure/Convulsions/Epilepsy: Please do not drive, operate heavy machinery, participate in activities at heights or participate in high speed sports until you have seen by Primary MD or a Neurologist and advised to do so again.  If you had Gastrointestinal Bleeding: Please ask your Primary MD to check a complete blood count within one  week of discharge or at your next visit. Your endoscopic/colonoscopic biopsies that are pending at the time of discharge, will also need to followed by your Primary MD.  Get Medicines reviewed and adjusted. Please take all your medications with you for your next visit with your Primary MD  Please request your Primary MD to go over all hospital tests and procedure/radiological results at the follow up, please ask your Primary MD to get all Hospital records sent to his/her  office.  If you experience worsening of your admission symptoms, develop shortness of breath, life threatening emergency, suicidal or homicidal thoughts you must seek medical attention immediately by calling 911 or calling your MD immediately  if symptoms less severe.  You must read complete instructions/literature along with all the possible adverse reactions/side effects for all the Medicines you take and that have been prescribed to you. Take any new Medicines after you have completely understood and accpet all the possible adverse reactions/side effects.   Do not drive or operate heavy machinery when taking Pain medications.   Do not take more than prescribed Pain, Sleep and Anxiety Medications  Special Instructions: If you have smoked or chewed Tobacco  in the last 2 yrs please stop smoking, stop any regular Alcohol  and or any Recreational drug use.  Wear Seat belts while driving.  Please note You were cared for by a hospitalist during your hospital stay. If you have any questions about your discharge medications or the care you received while you were in the hospital after you are discharged, you can call the unit and asked to speak with the hospitalist on call if the hospitalist that took care of you is not available. Once you are discharged, your primary care physician will handle any further medical issues. Please note that NO REFILLS for any discharge medications will be authorized once you are discharged, as it is imperative that you return to your primary care physician (or establish a relationship with a primary care physician if you do not have one) for your aftercare needs so that they can reassess your need for medications and monitor your lab values.  You can reach the hospitalist office at phone [phone] or fax [phone]   If you do not have a primary care physician, you can call [phone] for a physician referral.  Activity: As tolerated with Full fall precautions use  walker/cane & assistance as needed  Diet: regular  Disposition Home

## 2016-11-15 NOTE — Discharge Summary (Signed)
Physician Discharge Summary  [name] [id number] DOB: 06/18/1961 DOA: 11/14/2016  PCP: Evan Peers, MD  Admit date: 11/14/2016 Discharge date: 11/15/2016  Admitted From: home Disposition:  home  Recommendations for Outpatient Follow-up:  1. Follow up with cardiology as an outpatient to be evaluated for stress testing  Home Health: none Equipment/Devices: none  Discharge Condition: stable CODE STATUS: Full  Diet recommendation: heart healthy  HPI: Evan Chambers is a 56 y.o. male with medical history significant for HTN presenting to the ED with intermittent, lower sternal chest pain, beginning its onset on 11/13/2016 in the afternoon. He describes this episodes as moderate severe, lasting about 2 minutes, nonradiating. This is unchanged with eating or with movement. He denies any nausea vomiting, or diaphoresis. He denies any left jaw or left arm pain. He denies any pain with deep inspiration. He denies any dizziness or falls. No syncope or presyncope. He has some acute dyspnea with this chest pain, but without cough. He denies any fever or chills or sick contacts. His appetite is normal, but he admits to eating salt rich foods. He denies any right leg swelling or calf pain ( [place]) . He denies any headaches or vision changes. No seizures. No confusion is reported. He was never seen by a cardiologist, or had a cardiac catheterization. He denies any recent long distance trips, new stressors, or new medicines. He is not on hormonal therapy or herbal supplements. He does not smoke. He does admit to being a social drinker. His last episode was yesterday around 4:56 PM, drinking a few beers. He denies any recreational drug use.  Hospital Course: Discharge Diagnoses:  Active Problems:   Chest pain   Elevated ETOH level   Hypokalemia   Elevated blood sugar level   Personal history of prostate cancer  Chest pain - patient with atypical chest pain, low risk, cardiology consulted  and recommended outpatient follow-up. Risk factors include high blood pressure and age, no family history of cardiac disease. He was chest pain-free shortly after admission and remained chest pain-free during this hospitalization. His cardiac markers have remained negative and EKG was nonischemic. He will be seen as an outpatient in cardiology clinic for a Myoview. Hypertension - he does not take medications regularly, he is supposed to take something but he does not exactly what. He was started on Coreg and hydrochlorothiazide, and was given prescriptions on discharge. Alcohol use - counseled regarding cessation  Discharge Instructions   Allergies as of 11/15/2016   No Known Allergies     Medication List    TAKE these medications   carvedilol 3.125 MG tablet Commonly known as:  COREG Take 1 tablet (3.125 mg total) by mouth 2 (two) times daily with a meal.   hydrochlorothiazide 25 MG tablet Commonly known as:  HYDRODIURIL Take 1 tablet (25 mg total) by mouth daily.   potassium chloride 10 MEQ tablet Commonly known as:  K-DUR,KLOR-CON Take 1 tablet (10 mEq total) by mouth daily. Start taking on:  11/16/2016      Follow-up Information    Evan Peers, MD. Schedule an appointment as soon as possible for a visit in 2 week(s).   Specialty:  Family Medicine Contact information: [address] [place] [address] [phone]        [facility] Follow up.   Why:  The office will call to arrange a stress test.  Contact information: [place]  [phone]         No Known Allergies  Consultations:  Cardiology  Procedures/Studies:  Dg Chest 2 View  Result Date: 11/14/2016 CLINICAL DATA:  Shortness of breath since this afternoon. History of hypertension. Chest pain for 1 hour. Nonsmoker. EXAM: CHEST  2 VIEW COMPARISON:  04/02/2009 FINDINGS: The heart size and  mediastinal contours are within normal limits. Both lungs are clear. The visualized skeletal structures are unremarkable. IMPRESSION: No active cardiopulmonary disease. Electronically Signed   By: Lucienne Capers M.D.   On: 11/14/2016 04:06     Subjective: - no chest pain, shortness of breath, no abdominal pain, nausea or vomiting.   Discharge Exam: Vitals:   11/15/16 0514 11/15/16 0841  BP: (!) 149/110 (!) 164/104  Pulse: 82 82  Resp: 18 15  Temp: 99.2 F (37.3 C)    Vitals:   11/14/16 2047 11/15/16 0030 11/15/16 0514 11/15/16 0841  BP:  (!) 150/92 (!) 149/110 (!) 164/104  Pulse: 83 84 82 82  Resp: 18 18 18 15   Temp: 98.7 F (37.1 C) 98.5 F (36.9 C) 99.2 F (37.3 C)   TempSrc: Oral Oral    SpO2: 100% 98% 100%   Weight:   66.2 kg (145 lb 14.4 oz)   Height:        General: Pt is alert, awake, not in acute distress Cardiovascular: RRR, S1/S2 +, no rubs, no gallops Respiratory: CTA bilaterally, no wheezing, no rhonchi Abdominal: Soft, NT, ND, bowel sounds + Extremities: no edema, no cyanosis    The results of significant diagnostics from this hospitalization (including imaging, microbiology, ancillary and laboratory) are listed below for reference.     Microbiology: No results found for this or any previous visit (from the past 240 hour(s)).   Labs: BNP (last 3 results) No results for input(s): BNP in the last 8760 hours. Basic Metabolic Panel:  Recent Labs Lab 11/14/16 0326 11/15/16 0816  NA 137 138  K 3.0* 3.7  CL 99* 102  CO2 20* 27  GLUCOSE 160* 100*  BUN 5* 5*  CREATININE 1.05 1.05  CALCIUM 9.2 9.5   Liver Function Tests:  Recent Labs Lab 11/15/16 0816  AST 42*  ALT 20  ALKPHOS 51  BILITOT 2.0*  PROT 7.3  ALBUMIN 4.1   No results for input(s): LIPASE, AMYLASE in the last 168 hours. No results for input(s): AMMONIA in the last 168 hours. CBC:  Recent Labs Lab 11/14/16 0326 11/15/16 0816  WBC 5.6 5.2  HGB 13.0 12.2*  HCT 37.9*  36.7*  MCV 87.7 88.0  PLT 154 144*   Cardiac Enzymes:  Recent Labs Lab 11/14/16 0326 11/14/16 0803 11/14/16 1001 11/14/16 1500  TROPONINI <0.03 <0.03 <0.03 <0.03   BNP: Invalid input(s): POCBNP CBG: No results for input(s): GLUCAP in the last 168 hours. D-Dimer  Recent Labs  11/14/16 0350  DDIMER <0.27   Hgb A1c No results for input(s): HGBA1C in the last 72 hours. Lipid Profile  Recent Labs  11/14/16 0803  CHOL 236*  HDL 80  LDLCALC 141*  TRIG 74  CHOLHDL 3.0   Thyroid function studies No results for input(s): TSH, T4TOTAL, T3FREE, THYROIDAB in the last 72 hours.  Invalid input(s): FREET3 Anemia work up No results for input(s): VITAMINB12, FOLATE, FERRITIN, TIBC, IRON, RETICCTPCT in the last 72 hours. Urinalysis    Component Value Date/Time   COLORURINE YELLOW 11/14/2016 0752   APPEARANCEUR CLEAR 11/14/2016 0752   LABSPEC 1.010 11/14/2016 0752   PHURINE 6.0  11/14/2016 [place] 11/14/2016 0752   HGBUR NEGATIVE 11/14/2016 0752   BILIRUBINUR NEGATIVE 11/14/2016 0752   KETONESUR NEGATIVE 11/14/2016 0752   PROTEINUR NEGATIVE 11/14/2016 0752   NITRITE NEGATIVE 11/14/2016 0752   LEUKOCYTESUR NEGATIVE 11/14/2016 0752   Sepsis Labs Invalid input(s): PROCALCITONIN,  WBC,  LACTICIDVEN Microbiology No results found for this or any previous visit (from the past 240 hour(s)).  Time coordinating discharge: 25 minutes  SIGNED:  Marzetta Board, MD  [facility] 11/15/2016, 11:17 AM Pager [phone]  If 7PM-7AM, please contact night-coverage [email or web address] Password [email or web address]

## 2016-11-17 LAB — HEMOGLOBIN A1C
Hgb A1c MFr Bld: 5.5 % (ref 4.8–5.6)
MEAN PLASMA GLUCOSE: 111 mg/dL

## 2016-11-23 ENCOUNTER — Telehealth (HOSPITAL_COMMUNITY): Payer: Self-pay | Admitting: *Deleted

## 2016-11-23 NOTE — Telephone Encounter (Signed)
Patient given detailed instructions per Myocardial Perfusion Study Information Sheet for the test on 11/24/16 at 0730. Patient notified to arrive 15 minutes early and that it is imperative to arrive on time for appointment to keep from having the test rescheduled.  If you need to cancel or reschedule your appointment, please call the office within 24 hours of your appointment. Failure to do so may result in a cancellation of your appointment, and a $50 no show fee. Patient verbalized understanding.Inis Borneman, Ranae Palms

## 2016-11-24 ENCOUNTER — Ambulatory Visit (HOSPITAL_COMMUNITY): Payer: Medicare Other | Attending: Cardiovascular Disease

## 2016-11-24 DIAGNOSIS — R079 Chest pain, unspecified: Secondary | ICD-10-CM

## 2016-11-24 LAB — MYOCARDIAL PERFUSION IMAGING
CHL CUP NUCLEAR SRS: 4
CHL CUP NUCLEAR SSS: 5
CSEPPHR: 107 {beats}/min
LV dias vol: 94 mL (ref 62–150)
LV sys vol: 41 mL
RATE: 0.28
Rest HR: 83 {beats}/min
SDS: 1
TID: 0.96

## 2016-11-24 MED ORDER — TECHNETIUM TC 99M TETROFOSMIN IV KIT
10.3000 | PACK | Freq: Once | INTRAVENOUS | Status: AC | PRN
  Administered 2016-11-24: 10.3 via INTRAVENOUS
  Filled 2016-11-24: qty 11

## 2016-11-24 MED ORDER — TECHNETIUM TC 99M TETROFOSMIN IV KIT
31.3000 | PACK | Freq: Once | INTRAVENOUS | Status: AC | PRN
  Administered 2016-11-24: 31.3 via INTRAVENOUS
  Filled 2016-11-24: qty 32

## 2016-11-24 MED ORDER — REGADENOSON 0.4 MG/5ML IV SOLN
0.4000 mg | Freq: Once | INTRAVENOUS | Status: AC
  Administered 2016-11-24: 0.4 mg via INTRAVENOUS

## 2017-05-25 DIAGNOSIS — I10 Essential (primary) hypertension: Secondary | ICD-10-CM | POA: Diagnosis not present

## 2017-05-25 DIAGNOSIS — R7309 Other abnormal glucose: Secondary | ICD-10-CM | POA: Diagnosis not present

## 2017-06-15 DIAGNOSIS — I10 Essential (primary) hypertension: Secondary | ICD-10-CM | POA: Diagnosis not present

## 2017-06-15 DIAGNOSIS — E782 Mixed hyperlipidemia: Secondary | ICD-10-CM | POA: Diagnosis not present

## 2017-06-15 DIAGNOSIS — R7309 Other abnormal glucose: Secondary | ICD-10-CM | POA: Diagnosis not present

## 2017-07-14 ENCOUNTER — Other Ambulatory Visit (HOSPITAL_COMMUNITY): Payer: Self-pay | Admitting: Family Medicine

## 2017-07-14 DIAGNOSIS — R1310 Dysphagia, unspecified: Secondary | ICD-10-CM

## 2017-07-14 DIAGNOSIS — K210 Gastro-esophageal reflux disease with esophagitis: Secondary | ICD-10-CM | POA: Diagnosis not present

## 2017-07-14 DIAGNOSIS — I9589 Other hypotension: Secondary | ICD-10-CM | POA: Diagnosis not present

## 2017-07-14 DIAGNOSIS — R634 Abnormal weight loss: Secondary | ICD-10-CM | POA: Diagnosis not present

## 2017-07-16 ENCOUNTER — Ambulatory Visit (HOSPITAL_COMMUNITY)
Admission: RE | Admit: 2017-07-16 | Discharge: 2017-07-16 | Disposition: A | Discharge status: Home / Self Care | Payer: Medicare Other | Source: Ambulatory Visit | Attending: Family Medicine | Admitting: Family Medicine

## 2017-07-16 DIAGNOSIS — E871 Hypo-osmolality and hyponatremia: Secondary | ICD-10-CM | POA: Diagnosis not present

## 2017-07-16 DIAGNOSIS — K449 Diaphragmatic hernia without obstruction or gangrene: Secondary | ICD-10-CM

## 2017-07-16 DIAGNOSIS — R9431 Abnormal electrocardiogram [ECG] [EKG]: Secondary | ICD-10-CM | POA: Diagnosis not present

## 2017-07-16 DIAGNOSIS — R748 Abnormal levels of other serum enzymes: Secondary | ICD-10-CM | POA: Diagnosis not present

## 2017-07-16 DIAGNOSIS — E860 Dehydration: Secondary | ICD-10-CM | POA: Diagnosis not present

## 2017-07-16 DIAGNOSIS — R1310 Dysphagia, unspecified: Secondary | ICD-10-CM

## 2017-07-16 DIAGNOSIS — E876 Hypokalemia: Secondary | ICD-10-CM | POA: Diagnosis not present

## 2017-07-16 DIAGNOSIS — K222 Esophageal obstruction: Secondary | ICD-10-CM | POA: Diagnosis not present

## 2017-07-16 DIAGNOSIS — N179 Acute kidney failure, unspecified: Secondary | ICD-10-CM | POA: Diagnosis not present

## 2017-07-17 ENCOUNTER — Encounter (HOSPITAL_COMMUNITY): Payer: Self-pay | Admitting: *Deleted

## 2017-07-17 ENCOUNTER — Inpatient Hospital Stay (HOSPITAL_COMMUNITY)
Admission: EM | Admit: 2017-07-17 | Discharge: 2017-07-19 | DRG: 683 | Disposition: A | Discharge status: Home / Self Care | Payer: Medicare Other | Attending: Nephrology | Admitting: Nephrology

## 2017-07-17 ENCOUNTER — Observation Stay (HOSPITAL_COMMUNITY): Payer: Medicare Other

## 2017-07-17 DIAGNOSIS — R9431 Abnormal electrocardiogram [ECG] [EKG]: Secondary | ICD-10-CM | POA: Diagnosis not present

## 2017-07-17 DIAGNOSIS — Z560 Unemployment, unspecified: Secondary | ICD-10-CM

## 2017-07-17 DIAGNOSIS — Q393 Congenital stenosis and stricture of esophagus: Secondary | ICD-10-CM

## 2017-07-17 DIAGNOSIS — Z8546 Personal history of malignant neoplasm of prostate: Secondary | ICD-10-CM

## 2017-07-17 DIAGNOSIS — N3281 Overactive bladder: Secondary | ICD-10-CM | POA: Diagnosis not present

## 2017-07-17 DIAGNOSIS — E871 Hypo-osmolality and hyponatremia: Secondary | ICD-10-CM | POA: Diagnosis not present

## 2017-07-17 DIAGNOSIS — E860 Dehydration: Secondary | ICD-10-CM | POA: Diagnosis present

## 2017-07-17 DIAGNOSIS — E6609 Other obesity due to excess calories: Secondary | ICD-10-CM | POA: Diagnosis not present

## 2017-07-17 DIAGNOSIS — K222 Esophageal obstruction: Secondary | ICD-10-CM | POA: Diagnosis present

## 2017-07-17 DIAGNOSIS — R1310 Dysphagia, unspecified: Secondary | ICD-10-CM

## 2017-07-17 DIAGNOSIS — N281 Cyst of kidney, acquired: Secondary | ICD-10-CM | POA: Diagnosis not present

## 2017-07-17 DIAGNOSIS — Z8249 Family history of ischemic heart disease and other diseases of the circulatory system: Secondary | ICD-10-CM

## 2017-07-17 DIAGNOSIS — N179 Acute kidney failure, unspecified: Secondary | ICD-10-CM | POA: Diagnosis not present

## 2017-07-17 DIAGNOSIS — F064 Anxiety disorder due to known physiological condition: Secondary | ICD-10-CM | POA: Diagnosis not present

## 2017-07-17 DIAGNOSIS — K449 Diaphragmatic hernia without obstruction or gangrene: Secondary | ICD-10-CM

## 2017-07-17 DIAGNOSIS — R748 Abnormal levels of other serum enzymes: Secondary | ICD-10-CM | POA: Diagnosis not present

## 2017-07-17 DIAGNOSIS — I10 Essential (primary) hypertension: Secondary | ICD-10-CM | POA: Diagnosis not present

## 2017-07-17 DIAGNOSIS — E876 Hypokalemia: Secondary | ICD-10-CM | POA: Diagnosis present

## 2017-07-17 DIAGNOSIS — Z79899 Other long term (current) drug therapy: Secondary | ICD-10-CM

## 2017-07-17 DIAGNOSIS — Z7289 Other problems related to lifestyle: Secondary | ICD-10-CM

## 2017-07-17 DIAGNOSIS — Z89619 Acquired absence of unspecified leg above knee: Secondary | ICD-10-CM

## 2017-07-17 HISTORY — DX: Diaphragmatic hernia without obstruction or gangrene: K44.9

## 2017-07-17 HISTORY — DX: Congenital stenosis and stricture of esophagus: Q39.3

## 2017-07-17 HISTORY — DX: Malignant neoplasm of prostate: C61

## 2017-07-17 HISTORY — DX: Esophageal obstruction: K22.2

## 2017-07-17 LAB — CBC
HEMATOCRIT: 41.2 % (ref 39.0–52.0)
HEMOGLOBIN: 14.7 g/dL (ref 13.0–17.0)
MCH: 29.9 pg (ref 26.0–34.0)
MCHC: 35.7 g/dL (ref 30.0–36.0)
MCV: 83.7 fL (ref 78.0–100.0)
Platelets: 263 10*3/uL (ref 150–400)
RBC: 4.92 MIL/uL (ref 4.22–5.81)
RDW: 13.4 % (ref 11.5–15.5)
WBC: 6.5 10*3/uL (ref 4.0–10.5)

## 2017-07-17 LAB — URINALYSIS, ROUTINE W REFLEX MICROSCOPIC
BILIRUBIN URINE: NEGATIVE
GLUCOSE, UA: NEGATIVE mg/dL
Hgb urine dipstick: NEGATIVE
Ketones, ur: NEGATIVE mg/dL
Leukocytes, UA: NEGATIVE
NITRITE: NEGATIVE
PH: 5 (ref 5.0–8.0)
Protein, ur: NEGATIVE mg/dL
SPECIFIC GRAVITY, URINE: 1.01 (ref 1.005–1.030)

## 2017-07-17 LAB — COMPREHENSIVE METABOLIC PANEL
ALBUMIN: 4.9 g/dL (ref 3.5–5.0)
ALT: 15 U/L — ABNORMAL LOW (ref 17–63)
AST: 23 U/L (ref 15–41)
Alkaline Phosphatase: 66 U/L (ref 38–126)
Anion gap: 15 (ref 5–15)
BUN: 89 mg/dL — ABNORMAL HIGH (ref 6–20)
CHLORIDE: 88 mmol/L — AB (ref 101–111)
CO2: 25 mmol/L (ref 22–32)
Calcium: 10.1 mg/dL (ref 8.9–10.3)
Creatinine, Ser: 3.2 mg/dL — ABNORMAL HIGH (ref 0.61–1.24)
GFR calc Af Amer: 24 mL/min — ABNORMAL LOW (ref >60)
GFR calc non Af Amer: 20 mL/min — ABNORMAL LOW (ref >60)
GLUCOSE: 127 mg/dL — AB (ref 65–99)
POTASSIUM: 3.5 mmol/L (ref 3.5–5.1)
SODIUM: 128 mmol/L — AB (ref 135–145)
TOTAL PROTEIN: 9 g/dL — AB (ref 6.5–8.1)
Total Bilirubin: 2.3 mg/dL — ABNORMAL HIGH (ref 0.3–1.2)

## 2017-07-17 LAB — LIPASE, BLOOD: Lipase: 81 U/L — ABNORMAL HIGH (ref 11–51)

## 2017-07-17 MED ORDER — ENOXAPARIN SODIUM 30 MG/0.3ML ~~LOC~~ SOLN
30.0000 mg | SUBCUTANEOUS | Status: DC
  Administered 2017-07-17: 30 mg via SUBCUTANEOUS
  Filled 2017-07-17 (×2): qty 0.3

## 2017-07-17 MED ORDER — ONDANSETRON HCL 4 MG/2ML IJ SOLN
4.0000 mg | Freq: Four times a day (QID) | INTRAMUSCULAR | Status: DC | PRN
  Administered 2017-07-17: 4 mg via INTRAVENOUS
  Filled 2017-07-17: qty 2

## 2017-07-17 MED ORDER — ACETAMINOPHEN 650 MG RE SUPP: 650.0000 mg | Freq: Four times a day (QID) | RECTAL | Status: DC | PRN

## 2017-07-17 MED ORDER — SODIUM CHLORIDE 0.9 % IV BOLUS (SEPSIS)
1000.0000 mL | Freq: Once | INTRAVENOUS | Status: AC
Start: 2017-07-17 — End: 2017-07-17
  Administered 2017-07-17: 1000 mL via INTRAVENOUS

## 2017-07-17 MED ORDER — SODIUM CHLORIDE 0.9 % IV BOLUS (SEPSIS)
1000.0000 mL | Freq: Once | INTRAVENOUS | Status: AC
  Administered 2017-07-17: 1000 mL via INTRAVENOUS

## 2017-07-17 MED ORDER — ACETAMINOPHEN 325 MG PO TABS
650.0000 mg | ORAL_TABLET | Freq: Four times a day (QID) | ORAL | Status: DC | PRN
  Filled 2017-07-17: qty 2

## 2017-07-17 MED ORDER — GI COCKTAIL ~~LOC~~
30.0000 mL | Freq: Two times a day (BID) | ORAL | Status: DC | PRN
  Filled 2017-07-17: qty 30

## 2017-07-17 MED ORDER — ONDANSETRON HCL 4 MG PO TABS: 4.0000 mg | ORAL_TABLET | Freq: Four times a day (QID) | ORAL | Status: DC | PRN

## 2017-07-17 MED ORDER — SODIUM CHLORIDE 0.9 % IV SOLN
INTRAVENOUS | Status: AC
  Administered 2017-07-17 – 2017-07-18 (×3): via INTRAVENOUS

## 2017-07-17 MED ORDER — PANTOPRAZOLE SODIUM 40 MG IV SOLR
40.0000 mg | INTRAVENOUS | Status: DC
  Administered 2017-07-17 – 2017-07-18 (×2): 40 mg via INTRAVENOUS
  Filled 2017-07-17 (×2): qty 40

## 2017-07-17 NOTE — H&P (Signed)
History and Physical    [name]:[id number] DOB: [birth year] DOA: 07/17/2017  PCP: Lucianne Lei, MD Patient coming from: home  Chief Complaint: difficulty swallowing/ abnormal lab  HPI: Evan Chambers is a 56 y.o. male with medical history significant for hypertension, prostate cancer status post surgery 5 years ago presents to the emergency department from his primary care provider's office due to elevated BUN and creatinine. Initial evaluation reveals hyponatremia acute kidney injury. [facility] are asked to admit  Information is obtained from the patient and the chart. He states for about 1 week he's had difficulty swallowing. He states that food "feels like a big chunk of unchewed meat stuck behind my chest". Associated symptoms include intermittent nausea without vomiting. However he has regurgitated solid foods. As a result he has not taken in food or liquid over the last several days to his normal level. He denies headache dizziness syncope or near-syncope. He denies chest pain palpitation shortness of breath diaphoresis lower extremity edema. He denies abdominal pain dysuria hematuria frequency or urgency. He denies constipation diarrhea melena bright red blood per rectum. He states he had a barium swallow performed yesterday she says was "negative".    ED Course: In the emergency department is afebrile hemodynamically stable and not hypoxic  Review of Systems: As per HPI otherwise all other systems reviewed and are negative.   Ambulatory Status: He lives at home alone he ambulates independently with a steady gait. He is independent with ADLs currently on disability  Past Medical History:  Diagnosis Date  . Hiatal hernia   . Hypertension   . Lower esophageal ring   . Prostate CA [facility])     Past Surgical History:  Procedure Laterality Date  . HERNIA REPAIR    . LEG AMPUTATION ABOVE KNEE    . PROSTATECTOMY      Social History   Social History  . Marital  status: Single    Spouse name: N/A  . Number of children: N/A  . Years of education: N/A   Occupational History  . unemployed    Social History Main Topics  . Smoking status: Never Smoker  . Smokeless tobacco: Never Used  . Alcohol use Yes     Comment: drinks socially   . Drug use: No  . Sexual activity: Yes   Other Topics Concern  . Not on file   Social History Narrative  . No narrative on file    No Known Allergies  Family History  Problem Relation Age of Onset  . Hypertension Mother   . Hypertension Father     Prior to Admission medications   Not on File    Physical Exam: Vitals:   07/17/17 1045 07/17/17 1215 07/17/17 1245  BP: 97/72 106/83 103/77  Pulse: 80 67 68  Resp: 18    Temp: 97.6 F (36.4 C)    TempSrc: Oral    SpO2: 100% 100% 100%     General:  Appears calm and comfortable Sitting up in bed in no acute distress Eyes:  PERRL, EOMI, normal lids, iris ENT:  grossly normal hearing, lips & tongue, his membranes of his mouth are pink slightly dry Neck:  no LAD, masses or thyromegaly Cardiovascular:  RRR, no m/r/g. No LE edema. Pedal pulses present and palpable Respiratory:  CTA bilaterally, no w/r/r. Normal respiratory effort. Abdomen:  soft, ntnd, positive bowel sounds no guarding or rebounding Skin:  no rash or induration seen on limited exam Musculoskeletal:  grossly normal tone  BUE/BLE, good ROM, no bony abnormality Psychiatric:  grossly normal mood and affect, speech fluent and appropriate, AOx3 Neurologic:  CN 2-12 grossly intact, moves all extremities in coordinated fashion, sensation intact speech clear facial symmetry  Labs on Admission: I have personally reviewed following labs and imaging studies  CBC:  Recent Labs Lab 07/17/17 1158  WBC 6.5  HGB 14.7  HCT 41.2  MCV 83.7  PLT 408   Basic Metabolic Panel:  Recent Labs Lab 07/17/17 1158  NA 128*  K 3.5  CL 88*  CO2 25  GLUCOSE 127*  BUN 89*  CREATININE 3.20*  CALCIUM  10.1   GFR: CrCl cannot be calculated (Unknown ideal weight.). Liver Function Tests:  Recent Labs Lab 07/17/17 1158  AST 23  ALT 15*  ALKPHOS 66  BILITOT 2.3*  PROT 9.0*  ALBUMIN 4.9    Recent Labs Lab 07/17/17 1158  LIPASE 81*   No results for input(s): AMMONIA in the last 168 hours. Coagulation Profile: No results for input(s): INR, PROTIME in the last 168 hours. Cardiac Enzymes: No results for input(s): CKTOTAL, CKMB, CKMBINDEX, TROPONINI in the last 168 hours. BNP (last 3 results) No results for input(s): PROBNP in the last 8760 hours. HbA1C: No results for input(s): HGBA1C in the last 72 hours. CBG: No results for input(s): GLUCAP in the last 168 hours. Lipid Profile: No results for input(s): CHOL, HDL, LDLCALC, TRIG, CHOLHDL, LDLDIRECT in the last 72 hours. Thyroid Function Tests: No results for input(s): TSH, T4TOTAL, FREET4, T3FREE, THYROIDAB in the last 72 hours. Anemia Panel: No results for input(s): VITAMINB12, FOLATE, FERRITIN, TIBC, IRON, RETICCTPCT in the last 72 hours. Urine analysis:    Component Value Date/Time   COLORURINE YELLOW 07/17/2017 1108   APPEARANCEUR CLEAR 07/17/2017 1108   LABSPEC 1.010 07/17/2017 1108   PHURINE 5.0 07/17/2017 1108   GLUCOSEU NEGATIVE 07/17/2017 1108   [place] 07/17/2017 1108   [place] 07/17/2017 1108   [place] 07/17/2017 1108   PROTEINUR NEGATIVE 07/17/2017 1108   NITRITE NEGATIVE 07/17/2017 1108   LEUKOCYTESUR NEGATIVE 07/17/2017 1108    Creatinine Clearance: CrCl cannot be calculated (Unknown ideal weight.).  Sepsis Labs: @LABRCNTIP (procalcitonin:4,lacticidven:4) )No results found for this or any previous visit (from the past 240 hour(s)).   Radiological Exams on Admission: Dg Esophagus  Result Date: 07/16/2017 CLINICAL DATA:  Episode of food getting stuck 1 week ago. Subsequent dysphagia. EXAM: ESOPHOGRAM / BARIUM SWALLOW / BARIUM TABLET STUDY TECHNIQUE: Combined double  contrast and single contrast examination performed using effervescent crystals, thick barium liquid, and thin barium liquid. The patient was observed with fluoroscopy swallowing a 13 mm barium sulphate tablet. FLUOROSCOPY TIME:  Fluoroscopy Time:  1 minutes 18 seconds Radiation Exposure Index (if provided by the fluoroscopic device): 5.7 mGy Number of Acquired Spot Images: 0 COMPARISON:  None. FINDINGS: Lateral pharyngeal imaging is normal. There is good epiglottis turnover and no aspiration or laryngeal penetration. No obstructive process or significant stasis. Transiently seen partially circumferential ring like structure at the lower esophagus, above a small hiatal hernia that is seen when recumbent. This was nonobstructive. Mucosa is mildly redundant at this level with no ulceration, fissure, or mass. No dysmotility. IMPRESSION: Small, transient hiatal hernia with lower esophageal muscular ring that is nonobstructive. A 13 mm barium tablet easily traversed the esophagus. Mucosal folds are redundant this level which may be sequela of recent food impaction. No ulceration or fissure. Electronically Signed   By: Monte Fantasia M.D.   On: 07/16/2017 10:42  EKG:   Assessment/Plan Principal Problem:   Acute kidney injury [facility]) Active Problems:   Dehydration   Dysphagia   Hyponatremia   Hiatal hernia   Lower esophageal ring   #1. Acute kidney injury. Secondary to dehydration secondary to decreased oral intake in setting of dysphagia. Creatinine 3.2 on admission. Chart review indicates creatinine within limits of normal 8 months ago. He received 2 L normal saline in the emergency department -Admit to medical floor -Continue vigorous IV fluids -Hold nephrotoxins -Monitor urine output -Follow abdominal ultrasound results -Recheck in the morning -if no improvement consider renal US  #2. Hyponatremia/dehydration. Likely related to decreased oral intake. Innumerable 128 on admission -IV fluids as  noted above -full liquid diet. -supportive therapy I.e anti-emetic -Recheck in the morning -if no improvement will check serum osmolality and urine osmolality  #3.dysphagia. Etiology uncertain. Patient with an outpatient barium swallow yesterday revealing hiatal hernia and nonobstructive lower esophageal ring.  -full liquid diet -PPI -gi cocktail -see #2.  -complete OP workup  4. Hypertension. Blood pressure is on the low end of normal. Chart review indicates in the past he's been on Coreg and hydrochlorothiazide. He reports he's recently been taken off his antihypertensive medications -Monitor closely -IV fluids as noted above  #5. Hiatal hernia. -PPI   DVT prophylaxis: lovenox Code Status: full  Family Communication: none present  Disposition Plan: home  Consults called: none  Admission status: obs    Dyanne Carrel M MD [facility]  If 7PM-7AM, please contact night-coverage [email or web address] Password [email or web address]  07/17/2017, 1:42 PM

## 2017-07-17 NOTE — ED Triage Notes (Signed)
Pt sent here by pcp. Had been to pcp due to difficulty swallowing x 2 weeks. Had barium study that was negative but labs were abnormal, had elevated bun/cr, pcp thinks possible dehydration. Denies denies any pain.

## 2017-07-17 NOTE — ED Notes (Signed)
Patient returned from ultrasound.

## 2017-07-17 NOTE — ED Provider Notes (Signed)
[place] DEPT Provider Note   CSN: [id number] Arrival date & time: 07/17/17  1017     History   Chief Complaint Chief Complaint  Patient presents with  . Abnormal Lab  . Dysphagia    HPI Evan Chambers is a 56 y.o. male.  Patient with history of prostate cancer, occasional EtOH use (weekends) presents from primary care physician's office due to elevated BUN/creatinine. Patient reports having nausea and difficulty swallowing, described as solid food getting stuck in his throat, for approximately a week and half. He has had regurgitation with swallowing solids, not with liquids. He admits to decreased oral intake due to this. He had a barium swallow performed yesterday which was negative. Patient denies any abdominal pain, vomiting, fever, chest pain or shortness of breath. No bowel changes or blood in the stool. The onset of this condition was acute. The course is constant. Aggravating factors: none. Alleviating factors: none.        Past Medical History:  Diagnosis Date  . Hypertension   . Prostate CA [facility])     Patient Active Problem List   Diagnosis Date Noted  . Chest pain 11/14/2016  . Elevated ETOH level 11/14/2016  . Hypokalemia 11/14/2016  . Elevated blood sugar level 11/14/2016  . Personal history of prostate cancer 11/14/2016    Past Surgical History:  Procedure Laterality Date  . HERNIA REPAIR    . LEG AMPUTATION ABOVE KNEE    . PROSTATECTOMY         Home Medications    Prior to Admission medications   Medication Sig Start Date End Date Taking? Authorizing Provider  carvedilol (COREG) 3.125 MG tablet Take 1 tablet (3.125 mg total) by mouth 2 (two) times daily with a meal. 11/15/16   Gherghe, Vella Redhead, MD  hydrochlorothiazide (HYDRODIURIL) 25 MG tablet Take 1 tablet (25 mg total) by mouth daily. 11/15/16   Caren Griffins, MD  potassium chloride (K-DUR,KLOR-CON) 10 MEQ tablet Take 1 tablet (10 mEq total) by mouth daily. 11/16/16   Caren Griffins, MD    Family History Family History  Problem Relation Age of Onset  . Hypertension Mother   . Hypertension Father     Social History Social History  Substance Use Topics  . Smoking status: Never Smoker  . Smokeless tobacco: Never Used  . Alcohol use Yes     Comment: drinks socially      Allergies   Patient has no known allergies.   Review of Systems Review of Systems  Constitutional: Negative for fever.  HENT: Negative for rhinorrhea and sore throat.   Eyes: Negative for redness.  Respiratory: Negative for cough.   Cardiovascular: Negative for chest pain.  Gastrointestinal: Positive for nausea. Negative for abdominal pain, diarrhea and vomiting.  Genitourinary: Negative for dysuria.  Musculoskeletal: Negative for myalgias.  Skin: Negative for rash.  Neurological: Negative for headaches.     Physical Exam Updated Vital Signs BP 97/72 (BP Location: Right Arm)   Pulse 80   Temp 97.6 F (36.4 C) (Oral)   Resp 18   SpO2 100%   Physical Exam  Constitutional: He appears well-developed and well-nourished.  HENT:  Head: Normocephalic and atraumatic.  Slightly dry mucous membranes.  Eyes: Conjunctivae are normal. Right eye exhibits no discharge. Left eye exhibits no discharge.  Neck: Normal range of motion. Neck supple.  Cardiovascular: Normal rate, regular rhythm and normal heart sounds.   Pulmonary/Chest: Effort normal and breath sounds normal. No respiratory distress. He has  no wheezes. He has no rales.  Abdominal: Soft. There is no tenderness. There is no rebound and no guarding.  Neurological: He is alert.  Skin: Skin is warm and dry.  Psychiatric: He has a normal mood and affect.  Nursing note and vitals reviewed.    ED Treatments / Results  Labs (all labs ordered are listed, but only abnormal results are displayed) Labs Reviewed  LIPASE, BLOOD - Abnormal; Notable for the following:       Result Value   Lipase 81 (*)    All other  components within normal limits  COMPREHENSIVE METABOLIC PANEL - Abnormal; Notable for the following:    Sodium 128 (*)    Chloride 88 (*)    Glucose, Bld 127 (*)    BUN 89 (*)    Creatinine, Ser 3.20 (*)    Total Protein 9.0 (*)    ALT 15 (*)    Total Bilirubin 2.3 (*)    GFR calc non Af Amer 20 (*)    GFR calc Af Amer 24 (*)    All other components within normal limits  CBC  URINALYSIS, ROUTINE W REFLEX MICROSCOPIC    EKG  EKG Interpretation None       Radiology Dg Esophagus  Result Date: 07/16/2017 CLINICAL DATA:  Episode of food getting stuck 1 week ago. Subsequent dysphagia. EXAM: ESOPHOGRAM / BARIUM SWALLOW / BARIUM TABLET STUDY TECHNIQUE: Combined double contrast and single contrast examination performed using effervescent crystals, thick barium liquid, and thin barium liquid. The patient was observed with fluoroscopy swallowing a 13 mm barium sulphate tablet. FLUOROSCOPY TIME:  Fluoroscopy Time:  1 minutes 18 seconds Radiation Exposure Index (if provided by the fluoroscopic device): 5.7 mGy Number of Acquired Spot Images: 0 COMPARISON:  None. FINDINGS: Lateral pharyngeal imaging is normal. There is good epiglottis turnover and no aspiration or laryngeal penetration. No obstructive process or significant stasis. Transiently seen partially circumferential ring like structure at the lower esophagus, above a small hiatal hernia that is seen when recumbent. This was nonobstructive. Mucosa is mildly redundant at this level with no ulceration, fissure, or mass. No dysmotility. IMPRESSION: Small, transient hiatal hernia with lower esophageal muscular ring that is nonobstructive. A 13 mm barium tablet easily traversed the esophagus. Mucosal folds are redundant this level which may be sequela of recent food impaction. No ulceration or fissure. Electronically Signed   By: Monte Fantasia M.D.   On: 07/16/2017 10:42    Procedures Procedures (including critical care time)  Medications  Ordered in ED Medications  sodium chloride 0.9 % bolus 1,000 mL (not administered)  sodium chloride 0.9 % bolus 1,000 mL (1,000 mLs Intravenous New Bag/Given 07/17/17 1317)     Initial Impression / Assessment and Plan / ED Course  I have reviewed the triage vital signs and the nursing notes.  Pertinent labs & imaging results that were available during my care of the patient were reviewed by me and considered in my medical decision making (see chart for details).     Patient seen and examined. Work-up initiated.   Vital signs reviewed and are as follows: BP 97/72 (BP Location: Right Arm)   Pulse 80   Temp 97.6 F (36.4 C) (Oral)   Resp 18   SpO2 100%   1:11 PM Patient with AKI. D/w Dr. Rex Kras. [place] ordered, fluids ordered. Discussed with patient. He is agreeable to admission.   1:23 PM Spoke with hospitalist who will see.   ED ECG REPORT  Date: 07/17/2017  Rate: 62  Rhythm: normal sinus rhythm  QRS Axis: normal  Intervals: normal  ST/T Wave abnormalities: normal  Conduction Disutrbances:none  Narrative Interpretation:   Old EKG Reviewed: slower today  I have personally reviewed the EKG tracing and agree with the computerized printout as noted.   Final Clinical Impressions(s) / ED Diagnoses   Final diagnoses:  Acute kidney injury (HCC)  Elevated lipase  Dysphagia, unspecified type   Admit for elevated BUN/creatinine.   New Prescriptions New Prescriptions   No medications on file     Carlisle Cater, Hershal Coria 07/17/17 1323    Carlisle Cater, PA-C 07/17/17 [place], Wenda Overland, MD 07/18/17 1007

## 2017-07-17 NOTE — ED Notes (Signed)
Patient is stable and ready to be transport to the floor at this time.  Report was called to 2W RN.  Belongings taken with the patient to the floor.   

## 2017-07-17 NOTE — ED Notes (Signed)
Patient states not in any pain, just feels nauseated

## 2017-07-18 DIAGNOSIS — K449 Diaphragmatic hernia without obstruction or gangrene: Secondary | ICD-10-CM | POA: Diagnosis present

## 2017-07-18 DIAGNOSIS — Z79899 Other long term (current) drug therapy: Secondary | ICD-10-CM | POA: Diagnosis not present

## 2017-07-18 DIAGNOSIS — Z89619 Acquired absence of unspecified leg above knee: Secondary | ICD-10-CM | POA: Diagnosis not present

## 2017-07-18 DIAGNOSIS — Z7289 Other problems related to lifestyle: Secondary | ICD-10-CM | POA: Diagnosis not present

## 2017-07-18 DIAGNOSIS — R748 Abnormal levels of other serum enzymes: Secondary | ICD-10-CM | POA: Diagnosis present

## 2017-07-18 DIAGNOSIS — R1310 Dysphagia, unspecified: Secondary | ICD-10-CM

## 2017-07-18 DIAGNOSIS — Z8249 Family history of ischemic heart disease and other diseases of the circulatory system: Secondary | ICD-10-CM | POA: Diagnosis not present

## 2017-07-18 DIAGNOSIS — E876 Hypokalemia: Secondary | ICD-10-CM | POA: Diagnosis present

## 2017-07-18 DIAGNOSIS — E860 Dehydration: Secondary | ICD-10-CM

## 2017-07-18 DIAGNOSIS — K222 Esophageal obstruction: Secondary | ICD-10-CM | POA: Diagnosis present

## 2017-07-18 DIAGNOSIS — E871 Hypo-osmolality and hyponatremia: Secondary | ICD-10-CM

## 2017-07-18 DIAGNOSIS — N179 Acute kidney failure, unspecified: Secondary | ICD-10-CM | POA: Diagnosis not present

## 2017-07-18 DIAGNOSIS — I10 Essential (primary) hypertension: Secondary | ICD-10-CM | POA: Diagnosis present

## 2017-07-18 DIAGNOSIS — Z560 Unemployment, unspecified: Secondary | ICD-10-CM | POA: Diagnosis not present

## 2017-07-18 DIAGNOSIS — Z8546 Personal history of malignant neoplasm of prostate: Secondary | ICD-10-CM | POA: Diagnosis not present

## 2017-07-18 LAB — BASIC METABOLIC PANEL
ANION GAP: 9 (ref 5–15)
BUN: 52 mg/dL — ABNORMAL HIGH (ref 6–20)
CHLORIDE: 98 mmol/L — AB (ref 101–111)
CO2: 27 mmol/L (ref 22–32)
Calcium: 8.8 mg/dL — ABNORMAL LOW (ref 8.9–10.3)
Creatinine, Ser: 2.04 mg/dL — ABNORMAL HIGH (ref 0.61–1.24)
GFR calc Af Amer: 41 mL/min — ABNORMAL LOW (ref >60)
GFR calc non Af Amer: 35 mL/min — ABNORMAL LOW (ref >60)
GLUCOSE: 119 mg/dL — AB (ref 65–99)
POTASSIUM: 3.3 mmol/L — AB (ref 3.5–5.1)
Sodium: 134 mmol/L — ABNORMAL LOW (ref 135–145)

## 2017-07-18 LAB — HIV ANTIBODY (ROUTINE TESTING W REFLEX): HIV Screen 4th Generation wRfx: NONREACTIVE

## 2017-07-18 MED ORDER — ENOXAPARIN SODIUM 40 MG/0.4ML ~~LOC~~ SOLN
40.0000 mg | SUBCUTANEOUS | Status: DC
  Administered 2017-07-18: 40 mg via SUBCUTANEOUS
  Filled 2017-07-18: qty 0.4

## 2017-07-18 MED ORDER — POTASSIUM CHLORIDE 10 MEQ/100ML IV SOLN
10.0000 meq | INTRAVENOUS | Status: AC
  Administered 2017-07-18 (×3): 10 meq via INTRAVENOUS
  Filled 2017-07-18 (×2): qty 100

## 2017-07-18 MED ORDER — POTASSIUM CHLORIDE CRYS ER 20 MEQ PO TBCR
40.0000 meq | EXTENDED_RELEASE_TABLET | Freq: Two times a day (BID) | ORAL | Status: DC
  Administered 2017-07-18 (×2): 40 meq via ORAL
  Filled 2017-07-18 (×2): qty 2

## 2017-07-18 NOTE — Progress Notes (Signed)
PROGRESS NOTE    Evan Chambers  QIO:[id number] DOB: 1961-03-10 DOA: 07/17/2017 PCP: Lucianne Lei, MD   Brief Narrative: 56 y.o. male with medical history significant for hypertension, prostate cancer status post surgery 5 years ago presents to the emergency department from his primary care provider's office due to elevated BUN and creatinine. Initial evaluation reveals hyponatremia, acute kidney injury. [facility] are asked to admit.  Patient had barium swallow done on October 12 which was consistent with hiatal hernia with lower esophageal muscular ring that is nonobstructive.  Assessment & Plan:   # Acute kidney injury ([place]): Likely due to dehydration in the setting of decreased oral intake. UA unremarkable. Ultrasound of abdomen not significant for abnormalities. Treated with IV fluid with improvement in serum creatinine level. Plan to continue IV fluid today. Monitor BMP. Avoid nephrotoxins. I discussed this finding with the patient at bedside.  #Hyponatremia due to hypovolemic hyponatremia/dehydration: Serum sodium level improving with IV fluid. Continue to monitor.  #Hypokalemia: Replete potassium chloride. Check magnesium level. Monitor electrolytes. Encourage oral intake.  #History of dysphagia: Recently had barium swallow with small hiatal hernia. Continue Protonix. Recommended to follow-up with GI outpatient.  DVT prophylaxis: Lovenox subcutaneous Code Status: Full code Family Communication: No family at bedside Disposition Plan: Likely discharge home in 1-2 days    Consultants:   None  Procedures: None Antimicrobials: None  Subjective: Seen and examined at bedside. Feeling good. Denied nausea vomiting. Feels hungry. No urinary symptoms. No diarrhea constipation.  Objective: Vitals:   07/17/17 2217 07/17/17 2255 07/18/17 0454 07/18/17 0824  BP: (!) 89/57 (!) 94/59 100/62 96/65  Pulse: 60 70 (!) 56 (!) 57  Resp: 16  16 18   Temp: 98.6 F (37 C)  98.4 F  (36.9 C) 98.4 F (36.9 C)  TempSrc: Oral  Oral Oral  SpO2: 100%  100% 100%  Weight: 68 kg (150 lb)     Height: 5\' 7"  (1.702 m)       Intake/Output Summary (Last 24 hours) at 07/18/17 1425 Last data filed at 07/18/17 0900  Gross per 24 hour  Intake            10822 ml  Output                0 ml  Net            10822 ml   Filed Weights   07/17/17 2100 07/17/17 2217  Weight: 68 kg (150 lb) 68 kg (150 lb)    Examination:  General exam: Appears calm and comfortable  Respiratory system: Clear to auscultation. Respiratory effort normal. No wheezing or crackle Cardiovascular system: S1 & S2 heard, RRR.  No pedal edema. Gastrointestinal system: Abdomen is nondistended, soft and nontender. Normal bowel sounds heard. Central nervous system: Alert and oriented. No focal neurological deficits. Skin: No rashes, lesions or ulcers Psychiatry: Judgement and insight appear normal. Mood & affect appropriate.     Data Reviewed: I have personally reviewed following labs and imaging studies  CBC:  Recent Labs Lab 07/17/17 1158  WBC 6.5  HGB 14.7  HCT 41.2  MCV 83.7  PLT 324   Basic Metabolic Panel:  Recent Labs Lab 07/17/17 1158 07/18/17 0342  NA 128* 134*  K 3.5 3.3*  CL 88* 98*  CO2 25 27  GLUCOSE 127* 119*  BUN 89* 52*  CREATININE 3.20* 2.04*  CALCIUM 10.1 8.8*   GFR: Estimated Creatinine Clearance: 38.3 mL/min (A) (by C-G formula based on SCr of  2.04 mg/dL (H)). Liver Function Tests:  Recent Labs Lab 07/17/17 1158  AST 23  ALT 15*  ALKPHOS 66  BILITOT 2.3*  PROT 9.0*  ALBUMIN 4.9    Recent Labs Lab 07/17/17 1158  LIPASE 81*   No results for input(s): AMMONIA in the last 168 hours. Coagulation Profile: No results for input(s): INR, PROTIME in the last 168 hours. Cardiac Enzymes: No results for input(s): CKTOTAL, CKMB, CKMBINDEX, TROPONINI in the last 168 hours. BNP (last 3 results) No results for input(s): PROBNP in the last 8760 hours. HbA1C: No  results for input(s): HGBA1C in the last 72 hours. CBG: No results for input(s): GLUCAP in the last 168 hours. Lipid Profile: No results for input(s): CHOL, HDL, LDLCALC, TRIG, CHOLHDL, LDLDIRECT in the last 72 hours. Thyroid Function Tests: No results for input(s): TSH, T4TOTAL, FREET4, T3FREE, THYROIDAB in the last 72 hours. Anemia Panel: No results for input(s): VITAMINB12, FOLATE, FERRITIN, TIBC, IRON, RETICCTPCT in the last 72 hours. Sepsis Labs: No results for input(s): PROCALCITON, LATICACIDVEN in the last 168 hours.  No results found for this or any previous visit (from the past 240 hour(s)).       Radiology Studies: US Abdomen Complete  Result Date: 07/17/2017 CLINICAL DATA:  Vomiting, elevated kidney function. EXAM: ABDOMEN ULTRASOUND COMPLETE COMPARISON:  None. FINDINGS: Gallbladder: No gallstones or wall thickening visualized. No sonographic Murphy sign noted by sonographer. Common bile duct: Diameter: 5 mm which is within normal limits. Liver: No focal lesion identified. Within normal limits in parenchymal echogenicity. Portal vein is patent on color Doppler imaging with normal direction of blood flow towards the liver. IVC: No abnormality visualized. Pancreas: Not visualized due to overlying bowel gas. Spleen: Size and appearance within normal limits. Right Kidney: Length: 10.3 cm. Echogenicity within normal limits. No mass or hydronephrosis visualized. Left Kidney: Length: 10.1 cm. 9 mm simple cyst is seen in lower pole. Echogenicity within normal limits. No mass or hydronephrosis visualized. Abdominal aorta: No aneurysm visualized. Other findings: None. IMPRESSION: Pancreas not visualized due to overlying bowel gas. No significant abnormality seen in the abdomen. Electronically Signed   By: Marijo Conception, M.D.   On: 07/17/2017 15:21        Scheduled Meds: . enoxaparin (LOVENOX) injection  40 mg Subcutaneous Q24H  . pantoprazole (PROTONIX) IV  40 mg Intravenous Q24H    . potassium chloride  40 mEq Oral BID   Continuous Infusions: . sodium chloride 100 mL/hr at 07/18/17 1358  . potassium chloride 10 mEq (07/18/17 1356)     LOS: 0 days    Finley Chevez Tanna Furry, MD [facility] Pager [phone]  If 7PM-7AM, please contact night-coverage [email or web address] Password [email or web address] 07/18/2017, 2:26 PM

## 2017-07-19 LAB — BASIC METABOLIC PANEL
ANION GAP: 6 (ref 5–15)
BUN: 24 mg/dL — ABNORMAL HIGH (ref 6–20)
CHLORIDE: 104 mmol/L (ref 101–111)
CO2: 23 mmol/L (ref 22–32)
CREATININE: 1.61 mg/dL — AB (ref 0.61–1.24)
Calcium: 9 mg/dL (ref 8.9–10.3)
GFR calc Af Amer: 54 mL/min — ABNORMAL LOW (ref >60)
GFR calc non Af Amer: 47 mL/min — ABNORMAL LOW (ref >60)
Glucose, Bld: 97 mg/dL (ref 65–99)
Potassium: 4.8 mmol/L (ref 3.5–5.1)
SODIUM: 133 mmol/L — AB (ref 135–145)

## 2017-07-19 LAB — MAGNESIUM: MAGNESIUM: 1.7 mg/dL (ref 1.7–2.4)

## 2017-07-19 MED ORDER — PANTOPRAZOLE SODIUM 40 MG PO TBEC: 40.0000 mg | DELAYED_RELEASE_TABLET | Freq: Every day | ORAL | 0 refills | Status: DC

## 2017-07-19 MED ORDER — PANTOPRAZOLE SODIUM 40 MG PO TBEC
40.0000 mg | DELAYED_RELEASE_TABLET | Freq: Every day | ORAL | Status: DC
  Administered 2017-07-19: 40 mg via ORAL
  Filled 2017-07-19: qty 1

## 2017-07-19 MED ORDER — ONDANSETRON HCL 4 MG PO TABS: 4.0000 mg | ORAL_TABLET | Freq: Four times a day (QID) | ORAL | 0 refills | Status: DC | PRN

## 2017-07-19 NOTE — Discharge Summary (Addendum)
Physician Discharge Summary  Evan Chambers WER:[id number] DOB: Mar 07, 1961 DOA: 07/17/2017  PCP: Lucianne Lei, MD  Admit date: 07/17/2017 Discharge date: 07/19/2017  Admitted From:home Disposition:home  Recommendations for Outpatient Follow-up:  1. Follow up with PCP in 1-2 weeks 2. Please obtain BMP in one week   Home Health:no Equipment/Devices:no Discharge Condition:stable CODE STATUS:full Diet recommendation:heart healthy  Brief/Interim Summary: 56 y.o.malewith medical history significant for hypertension, prostate cancer status post surgery 5 years ago presents to the emergency department from his primary care provider's office due to elevated BUN and creatinine. Initial evaluation reveals hyponatremia, acute kidney injury. [facility] are asked to admit. Patient had barium swallow done on October 12 which was consistent with hiatal hernia with lower esophageal muscular ring that is nonobstructive.  # Acute kidney injury ([place]): Likely due to dehydration in the setting of decreased oral intake. UA unremarkable. Ultrasound of abdomen not significant for abnormalities. Treated with IV fluid with improvement in serum creatinine level. Serum creatinine level trended to 1.6 from 3.2 on admission. Patient is now with good oral intake. Recommended to monitor lab with PCP within a week. He verbalized understanding.  #Hyponatremia due to hypovolemic hyponatremia/dehydration: improving. Lab monitor.  #Hypokalemia: improved.  #History of dysphagia: Recently had barium swallow with small hiatal hernia. Continue Protonix. Recommended to follow-up with GI outpatient.   Patient with borderline low blood pressure Developed red without any medication. Recommended to monitor blood pressure and heart rate closely and follow up with PCP.  Discharge Diagnoses:  Principal Problem:   Acute kidney injury ([place]) Active Problems:   Dehydration   Dysphagia   Hyponatremia   Hiatal  hernia   Lower esophageal ring   AKI (acute kidney injury) [facility])    Discharge Instructions  Discharge Instructions    Call MD for:  difficulty breathing, headache or visual disturbances    Complete by:  As directed    Call MD for:  extreme fatigue    Complete by:  As directed    Call MD for:  hives    Complete by:  As directed    Call MD for:  persistant dizziness or light-headedness    Complete by:  As directed    Call MD for:  persistant nausea and vomiting    Complete by:  As directed    Call MD for:  severe uncontrolled pain    Complete by:  As directed    Call MD for:  temperature >100.4    Complete by:  As directed    Diet - low sodium heart healthy    Complete by:  As directed    Discharge instructions    Complete by:  As directed    Please check lab (BMP) to monitor your kidney function with your PCP within a week.   Increase activity slowly    Complete by:  As directed      Allergies as of 07/19/2017   No Known Allergies     Medication List    STOP taking these medications   carvedilol 3.125 MG tablet Commonly known as:  COREG   hydrochlorothiazide 25 MG tablet Commonly known as:  HYDRODIURIL   potassium chloride 10 MEQ tablet Commonly known as:  K-DUR,KLOR-CON     TAKE these medications   ondansetron 4 MG tablet Commonly known as:  ZOFRAN Take 1 tablet (4 mg total) by mouth every 6 (six) hours as needed for nausea.   pantoprazole 40 MG tablet Commonly known as:  PROTONIX Take 1 tablet (  40 mg total) by mouth daily.      Follow-up Information    Lucianne Lei, MD. Schedule an appointment as soon as possible for a visit in 1 week(s).   Specialty:  Family Medicine Contact information: [address] [place] [address] [phone]          No Known Allergies  Consultations: none  Procedures/Studies:  none  Subjective: Seen and examined at bedside. Reported doing good. Denied headache or mild dizziness, nausea, vomiting,  chest pain, shortness of breath or abdominal pain. No dysuria, urgency or frequency.  Discharge Exam: Vitals:   07/18/17 1956 07/19/17 0456  BP: 98/67 105/65  Pulse: 62 75  Resp: 14 12  Temp:  98.3 F (36.8 C)  SpO2: 100% 96%   Vitals:   07/18/17 0824 07/18/17 1516 07/18/17 1956 07/19/17 0456  BP: 96/65 95/65 98/67  105/65  Pulse: (!) 57 64 62 75  Resp: 18 17 14 12   Temp: 98.4 F (36.9 C) 98.6 F (37 C)  98.3 F (36.8 C)  TempSrc: Oral Oral    SpO2: 100%  100% 96%  Weight:      Height:        General: Pt is alert, awake, not in acute distress Cardiovascular: RRR, S1/S2 +, no rubs, no gallops Respiratory: CTA bilaterally, no wheezing, no rhonchi Abdominal: Soft, NT, ND, bowel sounds + Extremities: no edema, no cyanosis    The results of significant diagnostics from this hospitalization (including imaging, microbiology, ancillary and laboratory) are listed below for reference.     Microbiology: No results found for this or any previous visit (from the past 240 hour(s)).   Labs: BNP (last 3 results) No results for input(s): BNP in the last 8760 hours. Basic Metabolic Panel:  Recent Labs Lab 07/17/17 1158 07/18/17 0342 07/19/17 0251  NA 128* 134* 133*  K 3.5 3.3* 4.8  CL 88* 98* 104  CO2 25 27 23   GLUCOSE 127* 119* 97  BUN 89* 52* 24*  CREATININE 3.20* 2.04* 1.61*  CALCIUM 10.1 8.8* 9.0  MG  --   --  1.7   Liver Function Tests:  Recent Labs Lab 07/17/17 1158  AST 23  ALT 15*  ALKPHOS 66  BILITOT 2.3*  PROT 9.0*  ALBUMIN 4.9    Recent Labs Lab 07/17/17 1158  LIPASE 81*   No results for input(s): AMMONIA in the last 168 hours. CBC:  Recent Labs Lab 07/17/17 1158  WBC 6.5  HGB 14.7  HCT 41.2  MCV 83.7  PLT 263   Cardiac Enzymes: No results for input(s): CKTOTAL, CKMB, CKMBINDEX, TROPONINI in the last 168 hours. BNP: Invalid input(s): POCBNP CBG: No results for input(s): GLUCAP in the last 168 hours. D-Dimer No results for  input(s): DDIMER in the last 72 hours. Hgb A1c No results for input(s): HGBA1C in the last 72 hours. Lipid Profile No results for input(s): CHOL, HDL, LDLCALC, TRIG, CHOLHDL, LDLDIRECT in the last 72 hours. Thyroid function studies No results for input(s): TSH, T4TOTAL, T3FREE, THYROIDAB in the last 72 hours.  Invalid input(s): FREET3 Anemia work up No results for input(s): VITAMINB12, FOLATE, FERRITIN, TIBC, IRON, RETICCTPCT in the last 72 hours. Urinalysis    Component Value Date/Time   COLORURINE YELLOW 07/17/2017 1108   APPEARANCEUR CLEAR 07/17/2017 1108   LABSPEC 1.010 07/17/2017 1108   PHURINE 5.0 07/17/2017 1108   [place] 07/17/2017 1108   [place] 07/17/2017 [place] 07/17/2017 1108   KETONESUR  NEGATIVE 07/17/2017 1108   PROTEINUR NEGATIVE 07/17/2017 1108   NITRITE NEGATIVE 07/17/2017 1108   LEUKOCYTESUR NEGATIVE 07/17/2017 1108   Sepsis Labs Invalid input(s): PROCALCITONIN,  WBC,  LACTICIDVEN Microbiology No results found for this or any previous visit (from the past 240 hour(s)).   Time coordinating discharge: 28 minutes  SIGNED:   Rosita Fire, MD  [facility] 07/19/2017, 11:00 AM  If 7PM-7AM, please contact night-coverage [email or web address] Password [email or web address]

## 2017-07-19 NOTE — Plan of Care (Signed)
Problem: Tissue Perfusion: Goal: Risk factors for ineffective tissue perfusion will decrease Outcome: Adequate for Discharge Patient aware of need of exercise for overall health.

## 2017-08-04 DIAGNOSIS — E860 Dehydration: Secondary | ICD-10-CM | POA: Diagnosis not present

## 2017-08-04 DIAGNOSIS — Z6825 Body mass index (BMI) 25.0-25.9, adult: Secondary | ICD-10-CM | POA: Diagnosis not present

## 2018-05-05 DIAGNOSIS — I10 Essential (primary) hypertension: Secondary | ICD-10-CM | POA: Diagnosis not present

## 2018-05-05 DIAGNOSIS — S78112S Complete traumatic amputation at level between left hip and knee, sequela: Secondary | ICD-10-CM | POA: Diagnosis not present

## 2018-05-05 DIAGNOSIS — R7309 Other abnormal glucose: Secondary | ICD-10-CM | POA: Diagnosis not present

## 2018-05-05 DIAGNOSIS — E6609 Other obesity due to excess calories: Secondary | ICD-10-CM | POA: Diagnosis not present

## 2018-05-05 DIAGNOSIS — Z125 Encounter for screening for malignant neoplasm of prostate: Secondary | ICD-10-CM | POA: Diagnosis not present

## 2018-05-05 DIAGNOSIS — E782 Mixed hyperlipidemia: Secondary | ICD-10-CM | POA: Diagnosis not present

## 2018-06-17 DIAGNOSIS — E782 Mixed hyperlipidemia: Secondary | ICD-10-CM | POA: Diagnosis not present

## 2018-06-17 DIAGNOSIS — K210 Gastro-esophageal reflux disease with esophagitis: Secondary | ICD-10-CM | POA: Diagnosis not present

## 2018-06-17 DIAGNOSIS — I10 Essential (primary) hypertension: Secondary | ICD-10-CM | POA: Diagnosis not present

## 2018-06-17 DIAGNOSIS — R7309 Other abnormal glucose: Secondary | ICD-10-CM | POA: Diagnosis not present

## 2018-12-20 DIAGNOSIS — R7309 Other abnormal glucose: Secondary | ICD-10-CM | POA: Diagnosis not present

## 2018-12-20 DIAGNOSIS — Z89612 Acquired absence of left leg above knee: Secondary | ICD-10-CM | POA: Diagnosis not present

## 2018-12-20 DIAGNOSIS — C61 Malignant neoplasm of prostate: Secondary | ICD-10-CM | POA: Diagnosis not present

## 2018-12-20 DIAGNOSIS — R000 Tachycardia, unspecified: Secondary | ICD-10-CM | POA: Diagnosis not present

## 2018-12-20 DIAGNOSIS — M130 Polyarthritis, unspecified: Secondary | ICD-10-CM | POA: Diagnosis not present

## 2018-12-20 DIAGNOSIS — E6609 Other obesity due to excess calories: Secondary | ICD-10-CM | POA: Diagnosis not present

## 2019-01-03 ENCOUNTER — Other Ambulatory Visit: Payer: Self-pay

## 2019-01-03 ENCOUNTER — Other Ambulatory Visit: Payer: Self-pay | Admitting: Family Medicine

## 2019-01-03 ENCOUNTER — Ambulatory Visit
Admission: RE | Admit: 2019-01-03 | Discharge: 2019-01-03 | Disposition: A | Discharge status: Home / Self Care | Payer: Medicare HMO | Source: Ambulatory Visit | Attending: Family Medicine | Admitting: Family Medicine

## 2019-01-03 DIAGNOSIS — R252 Cramp and spasm: Secondary | ICD-10-CM | POA: Diagnosis not present

## 2019-01-03 DIAGNOSIS — I10 Essential (primary) hypertension: Secondary | ICD-10-CM | POA: Diagnosis not present

## 2019-01-03 DIAGNOSIS — Z0000 Encounter for general adult medical examination without abnormal findings: Secondary | ICD-10-CM | POA: Diagnosis not present

## 2019-01-03 DIAGNOSIS — M130 Polyarthritis, unspecified: Secondary | ICD-10-CM

## 2019-01-03 DIAGNOSIS — R7301 Impaired fasting glucose: Secondary | ICD-10-CM | POA: Diagnosis not present

## 2019-01-03 DIAGNOSIS — M25561 Pain in right knee: Secondary | ICD-10-CM | POA: Diagnosis not present

## 2019-01-05 DIAGNOSIS — M1711 Unilateral primary osteoarthritis, right knee: Secondary | ICD-10-CM | POA: Diagnosis not present

## 2019-03-15 DIAGNOSIS — I10 Essential (primary) hypertension: Secondary | ICD-10-CM | POA: Diagnosis not present

## 2019-03-15 DIAGNOSIS — C61 Malignant neoplasm of prostate: Secondary | ICD-10-CM | POA: Diagnosis not present

## 2019-03-15 DIAGNOSIS — R42 Dizziness and giddiness: Secondary | ICD-10-CM | POA: Diagnosis not present

## 2019-03-15 DIAGNOSIS — I952 Hypotension due to drugs: Secondary | ICD-10-CM | POA: Diagnosis not present

## 2019-03-15 DIAGNOSIS — R110 Nausea: Secondary | ICD-10-CM | POA: Diagnosis not present

## 2019-03-16 ENCOUNTER — Encounter (HOSPITAL_COMMUNITY): Payer: Self-pay | Admitting: Internal Medicine

## 2019-03-16 ENCOUNTER — Emergency Department (HOSPITAL_COMMUNITY): Payer: Medicare HMO

## 2019-03-16 ENCOUNTER — Other Ambulatory Visit: Payer: Self-pay

## 2019-03-16 ENCOUNTER — Inpatient Hospital Stay (HOSPITAL_COMMUNITY)
Admission: EM | Admit: 2019-03-16 | Discharge: 2019-03-19 | DRG: 683 | Disposition: A | Discharge status: Home / Self Care | Payer: Medicare HMO | Attending: Student | Admitting: Student

## 2019-03-16 DIAGNOSIS — Z1159 Encounter for screening for other viral diseases: Secondary | ICD-10-CM | POA: Diagnosis not present

## 2019-03-16 DIAGNOSIS — E1165 Type 2 diabetes mellitus with hyperglycemia: Secondary | ICD-10-CM | POA: Diagnosis present

## 2019-03-16 DIAGNOSIS — E1169 Type 2 diabetes mellitus with other specified complication: Secondary | ICD-10-CM

## 2019-03-16 DIAGNOSIS — D631 Anemia in chronic kidney disease: Secondary | ICD-10-CM | POA: Diagnosis present

## 2019-03-16 DIAGNOSIS — Z9714 Presence of artificial left leg (complete) (partial): Secondary | ICD-10-CM

## 2019-03-16 DIAGNOSIS — Z9079 Acquired absence of other genital organ(s): Secondary | ICD-10-CM

## 2019-03-16 DIAGNOSIS — N189 Chronic kidney disease, unspecified: Secondary | ICD-10-CM | POA: Diagnosis present

## 2019-03-16 DIAGNOSIS — E8770 Fluid overload, unspecified: Secondary | ICD-10-CM | POA: Diagnosis not present

## 2019-03-16 DIAGNOSIS — Z8546 Personal history of malignant neoplasm of prostate: Secondary | ICD-10-CM

## 2019-03-16 DIAGNOSIS — E8729 Other acidosis: Secondary | ICD-10-CM | POA: Diagnosis present

## 2019-03-16 DIAGNOSIS — E1122 Type 2 diabetes mellitus with diabetic chronic kidney disease: Secondary | ICD-10-CM | POA: Diagnosis present

## 2019-03-16 DIAGNOSIS — E871 Hypo-osmolality and hyponatremia: Secondary | ICD-10-CM | POA: Diagnosis not present

## 2019-03-16 DIAGNOSIS — R739 Hyperglycemia, unspecified: Secondary | ICD-10-CM

## 2019-03-16 DIAGNOSIS — E785 Hyperlipidemia, unspecified: Secondary | ICD-10-CM | POA: Diagnosis present

## 2019-03-16 DIAGNOSIS — E118 Type 2 diabetes mellitus with unspecified complications: Secondary | ICD-10-CM | POA: Diagnosis not present

## 2019-03-16 DIAGNOSIS — E861 Hypovolemia: Secondary | ICD-10-CM | POA: Diagnosis present

## 2019-03-16 DIAGNOSIS — N179 Acute kidney failure, unspecified: Secondary | ICD-10-CM | POA: Diagnosis not present

## 2019-03-16 DIAGNOSIS — I959 Hypotension, unspecified: Secondary | ICD-10-CM | POA: Diagnosis present

## 2019-03-16 DIAGNOSIS — N19 Unspecified kidney failure: Secondary | ICD-10-CM

## 2019-03-16 DIAGNOSIS — Z8249 Family history of ischemic heart disease and other diseases of the circulatory system: Secondary | ICD-10-CM | POA: Diagnosis not present

## 2019-03-16 DIAGNOSIS — N170 Acute kidney failure with tubular necrosis: Secondary | ICD-10-CM | POA: Diagnosis not present

## 2019-03-16 DIAGNOSIS — I129 Hypertensive chronic kidney disease with stage 1 through stage 4 chronic kidney disease, or unspecified chronic kidney disease: Secondary | ICD-10-CM | POA: Diagnosis present

## 2019-03-16 DIAGNOSIS — D638 Anemia in other chronic diseases classified elsewhere: Secondary | ICD-10-CM | POA: Diagnosis not present

## 2019-03-16 DIAGNOSIS — R42 Dizziness and giddiness: Secondary | ICD-10-CM | POA: Diagnosis not present

## 2019-03-16 DIAGNOSIS — R55 Syncope and collapse: Secondary | ICD-10-CM | POA: Diagnosis not present

## 2019-03-16 DIAGNOSIS — Z03818 Encounter for observation for suspected exposure to other biological agents ruled out: Secondary | ICD-10-CM | POA: Diagnosis not present

## 2019-03-16 DIAGNOSIS — E876 Hypokalemia: Secondary | ICD-10-CM | POA: Diagnosis not present

## 2019-03-16 DIAGNOSIS — Z79899 Other long term (current) drug therapy: Secondary | ICD-10-CM

## 2019-03-16 DIAGNOSIS — E872 Acidosis: Secondary | ICD-10-CM

## 2019-03-16 DIAGNOSIS — D649 Anemia, unspecified: Secondary | ICD-10-CM | POA: Diagnosis not present

## 2019-03-16 HISTORY — DX: Acute kidney failure, unspecified: N17.9

## 2019-03-16 LAB — CBC WITH DIFFERENTIAL/PLATELET
Abs Immature Granulocytes: 0.03 10*3/uL (ref 0.00–0.07)
Basophils Absolute: 0 10*3/uL (ref 0.0–0.1)
Basophils Relative: 1 %
Eosinophils Absolute: 0 10*3/uL (ref 0.0–0.5)
Eosinophils Relative: 1 %
HCT: 31.3 % — ABNORMAL LOW (ref 39.0–52.0)
Hemoglobin: 10.7 g/dL — ABNORMAL LOW (ref 13.0–17.0)
Immature Granulocytes: 1 %
Lymphocytes Relative: 21 %
Lymphs Abs: 1 10*3/uL (ref 0.7–4.0)
MCH: 27.6 pg (ref 26.0–34.0)
MCHC: 34.2 g/dL (ref 30.0–36.0)
MCV: 80.9 fL (ref 80.0–100.0)
Monocytes Absolute: 0.4 10*3/uL (ref 0.1–1.0)
Monocytes Relative: 9 %
Neutro Abs: 3.2 10*3/uL (ref 1.7–7.7)
Neutrophils Relative %: 67 %
Platelets: 247 10*3/uL (ref 150–400)
RBC: 3.87 MIL/uL — ABNORMAL LOW (ref 4.22–5.81)
RDW: 13.8 % (ref 11.5–15.5)
WBC: 4.7 10*3/uL (ref 4.0–10.5)
nRBC: 0 % (ref 0.0–0.2)

## 2019-03-16 LAB — COMPREHENSIVE METABOLIC PANEL
ALT: 15 U/L (ref 0–44)
AST: 18 U/L (ref 15–41)
Albumin: 4.3 g/dL (ref 3.5–5.0)
Alkaline Phosphatase: 52 U/L (ref 38–126)
Anion gap: 22 — ABNORMAL HIGH (ref 5–15)
BUN: 77 mg/dL — ABNORMAL HIGH (ref 6–20)
CO2: 16 mmol/L — ABNORMAL LOW (ref 22–32)
Calcium: 9.4 mg/dL (ref 8.9–10.3)
Chloride: 85 mmol/L — ABNORMAL LOW (ref 98–111)
Creatinine, Ser: 10.49 mg/dL — ABNORMAL HIGH (ref 0.61–1.24)
GFR calc Af Amer: 6 mL/min — ABNORMAL LOW (ref >60)
GFR calc non Af Amer: 5 mL/min — ABNORMAL LOW (ref >60)
Glucose, Bld: 142 mg/dL — ABNORMAL HIGH (ref 70–99)
Potassium: 3.8 mmol/L (ref 3.5–5.1)
Sodium: 123 mmol/L — ABNORMAL LOW (ref 135–145)
Total Bilirubin: 1.6 mg/dL — ABNORMAL HIGH (ref 0.3–1.2)
Total Protein: 7.9 g/dL (ref 6.5–8.1)

## 2019-03-16 LAB — TROPONIN I: Troponin I: <0.03 ng/mL (ref <0.03)

## 2019-03-16 LAB — POCT I-STAT EG7
Acid-base deficit: 3 mmol/L — ABNORMAL HIGH (ref 0.0–2.0)
Bicarbonate: 18.7 mmol/L — ABNORMAL LOW (ref 20.0–28.0)
Calcium, Ion: 1.02 mmol/L — ABNORMAL LOW (ref 1.15–1.40)
HCT: 33 % — ABNORMAL LOW (ref 39.0–52.0)
Hemoglobin: 11.2 g/dL — ABNORMAL LOW (ref 13.0–17.0)
O2 Saturation: 97 %
Potassium: 3.8 mmol/L (ref 3.5–5.1)
Sodium: 121 mmol/L — ABNORMAL LOW (ref 135–145)
TCO2: 19 mmol/L — ABNORMAL LOW (ref 22–32)
pCO2, Ven: 24.5 mmHg — ABNORMAL LOW (ref 44.0–60.0)
pH, Ven: 7.491 — ABNORMAL HIGH (ref 7.250–7.430)
pO2, Ven: 82 mmHg — ABNORMAL HIGH (ref 32.0–45.0)

## 2019-03-16 LAB — URINALYSIS, ROUTINE W REFLEX MICROSCOPIC
Bilirubin Urine: NEGATIVE
Glucose, UA: 50 mg/dL — AB
Ketones, ur: NEGATIVE mg/dL
Leukocytes,Ua: NEGATIVE
Nitrite: NEGATIVE
Protein, ur: NEGATIVE mg/dL
Specific Gravity, Urine: 1.005 (ref 1.005–1.030)
pH: 5 (ref 5.0–8.0)

## 2019-03-16 LAB — SARS CORONAVIRUS 2: SARS Coronavirus 2: NOT DETECTED

## 2019-03-16 LAB — GLUCOSE, CAPILLARY
Glucose-Capillary: 101 mg/dL — ABNORMAL HIGH (ref 70–99)
Glucose-Capillary: 167 mg/dL — ABNORMAL HIGH (ref 70–99)

## 2019-03-16 LAB — HEMOGLOBIN A1C
Hgb A1c MFr Bld: 7 % — ABNORMAL HIGH (ref 4.8–5.6)
Mean Plasma Glucose: 154.2 mg/dL

## 2019-03-16 LAB — CK: Total CK: 123 U/L (ref 49–397)

## 2019-03-16 LAB — CORTISOL: Cortisol, Plasma: 14.6 ug/dL

## 2019-03-16 MED ORDER — AMLODIPINE BESYLATE 5 MG PO TABS: 5.0000 mg | ORAL_TABLET | Freq: Every day | ORAL | Status: DC

## 2019-03-16 MED ORDER — SODIUM CHLORIDE 0.9 % IV SOLN
Freq: Once | INTRAVENOUS | Status: AC
  Administered 2019-03-16: 15:00:00 via INTRAVENOUS

## 2019-03-16 MED ORDER — ONDANSETRON HCL 4 MG/2ML IJ SOLN
4.0000 mg | Freq: Once | INTRAMUSCULAR | Status: AC
Start: 2019-03-16 — End: 2019-03-16
  Administered 2019-03-16: 4 mg via INTRAVENOUS
  Filled 2019-03-16: qty 2

## 2019-03-16 MED ORDER — SODIUM CHLORIDE 0.9 % IV SOLN
INTRAVENOUS | Status: DC
  Administered 2019-03-16 – 2019-03-18 (×4): via INTRAVENOUS

## 2019-03-16 MED ORDER — ACETAMINOPHEN 325 MG PO TABS: 650.0000 mg | ORAL_TABLET | Freq: Four times a day (QID) | ORAL | Status: DC | PRN

## 2019-03-16 MED ORDER — SODIUM CHLORIDE 0.9 % IV BOLUS
1000.0000 mL | Freq: Once | INTRAVENOUS | Status: AC
  Administered 2019-03-16: 13:00:00 1000 mL via INTRAVENOUS

## 2019-03-16 MED ORDER — ONDANSETRON HCL 4 MG/2ML IJ SOLN: 4.0000 mg | Freq: Four times a day (QID) | INTRAMUSCULAR | Status: DC | PRN

## 2019-03-16 MED ORDER — INSULIN ASPART 100 UNIT/ML ~~LOC~~ SOLN
0.0000 [IU] | Freq: Three times a day (TID) | SUBCUTANEOUS | Status: DC
  Administered 2019-03-18: 1 [IU] via SUBCUTANEOUS

## 2019-03-16 MED ORDER — HEPARIN SODIUM (PORCINE) 5000 UNIT/ML IJ SOLN
5000.0000 [IU] | Freq: Three times a day (TID) | INTRAMUSCULAR | Status: DC
  Administered 2019-03-16 – 2019-03-19 (×9): 5000 [IU] via SUBCUTANEOUS
  Filled 2019-03-16 (×9): qty 1

## 2019-03-16 MED ORDER — ACETAMINOPHEN 650 MG RE SUPP: 650.0000 mg | Freq: Four times a day (QID) | RECTAL | Status: DC | PRN

## 2019-03-16 MED ORDER — STERILE WATER FOR INJECTION IV SOLN
INTRAVENOUS | Status: DC
  Administered 2019-03-16 – 2019-03-17 (×2): via INTRAVENOUS
  Filled 2019-03-16 (×6): qty 850

## 2019-03-16 MED ORDER — ROSUVASTATIN CALCIUM 5 MG PO TABS: 10.0000 mg | ORAL_TABLET | Freq: Every day | ORAL | Status: DC

## 2019-03-16 MED ORDER — ONDANSETRON HCL 4 MG PO TABS: 4.0000 mg | ORAL_TABLET | Freq: Four times a day (QID) | ORAL | Status: DC | PRN

## 2019-03-16 MED ORDER — DOCUSATE SODIUM 100 MG PO CAPS
100.0000 mg | ORAL_CAPSULE | Freq: Two times a day (BID) | ORAL | Status: DC
  Administered 2019-03-16 – 2019-03-19 (×6): 100 mg via ORAL
  Filled 2019-03-16 (×6): qty 1

## 2019-03-16 MED ORDER — NEPRO/CARBSTEADY PO LIQD
237.0000 mL | Freq: Two times a day (BID) | ORAL | Status: DC
  Administered 2019-03-17 – 2019-03-19 (×5): 237 mL via ORAL
  Filled 2019-03-16 (×6): qty 237
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
## 2019-03-16 NOTE — ED Notes (Signed)
Patient transported to Ultrasound 
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
## 2019-03-16 NOTE — ED Notes (Signed)
Got patient undress on the monitor did ekg shown to Dr Ronnald Nian patient is resting with call bell in reach

## 2019-03-16 NOTE — ED Triage Notes (Signed)
Pt here, sent by PCP for abnormal lab he had drawn yesterday. Pt does not know what lab value is off. Pt sts he has felt more weak than normal for a week.

## 2019-03-17 ENCOUNTER — Inpatient Hospital Stay (HOSPITAL_COMMUNITY): Payer: Medicare HMO

## 2019-03-17 DIAGNOSIS — E118 Type 2 diabetes mellitus with unspecified complications: Secondary | ICD-10-CM

## 2019-03-17 DIAGNOSIS — D638 Anemia in other chronic diseases classified elsewhere: Secondary | ICD-10-CM

## 2019-03-17 DIAGNOSIS — R55 Syncope and collapse: Secondary | ICD-10-CM

## 2019-03-17 DIAGNOSIS — R42 Dizziness and giddiness: Secondary | ICD-10-CM

## 2019-03-17 LAB — GLUCOSE, CAPILLARY
Glucose-Capillary: 89 mg/dL (ref 70–99)
Glucose-Capillary: 116 mg/dL — ABNORMAL HIGH (ref 70–99)
Glucose-Capillary: 126 mg/dL — ABNORMAL HIGH (ref 70–99)
Glucose-Capillary: 165 mg/dL — ABNORMAL HIGH (ref 70–99)

## 2019-03-17 LAB — OSMOLALITY, URINE: Osmolality, Ur: 226 mOsm/kg — ABNORMAL LOW (ref 300–900)

## 2019-03-17 LAB — NA AND K (SODIUM & POTASSIUM), RAND UR
Potassium Urine: 22 mmol/L
Sodium, Ur: 40 mmol/L

## 2019-03-17 LAB — PROTEIN / CREATININE RATIO, URINE
Creatinine, Urine: 75.64 mg/dL
Protein Creatinine Ratio: 0.48 mg/mg{Cre} — ABNORMAL HIGH (ref 0.00–0.15)
Total Protein, Urine: 36 mg/dL

## 2019-03-17 LAB — CREATININE, URINE, RANDOM: Creatinine, Urine: 74.48 mg/dL

## 2019-03-17 LAB — TSH: TSH: 1.226 u[IU]/mL (ref 0.350–4.500)

## 2019-03-17 LAB — ECHOCARDIOGRAM COMPLETE
Height: 66.5 in
Weight: 2536 oz

## 2019-03-17 LAB — HIV ANTIBODY (ROUTINE TESTING W REFLEX): HIV Screen 4th Generation wRfx: NONREACTIVE
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
## 2019-03-17 NOTE — Progress Notes (Signed)
[facility] Progress Note    Assessment/ Plan:    58 year old gentleman with HTN, DM and prostate cancer who was referred by Dr. Criss Rosales to the emergency room due to the findings of electrolyte abnormalities and renal function abnormalities on his chemistry.  Reports fatigue weakness presyncopal spells for over a week.   Underwent robot-assisted laparoscopic radical retropubic prostatectomy for T1c adenocarcinoma the prostate 2012. EF 55 to 65% low risk study 2018. Urinalysis bland no red blood cells no white blood cells glucose 50   Acute kidney injury with a creatinine about 1 year ago that was 1.6.  History of hypertension with no recent change in medications according to patient but taking ARB irbesartan/HCTZ and amlodipine for blood pressure control.  Patient complaining of presyncopal spells.  Creatinine elevated in the setting of systemic hypotension.   - Renal ultrasound neg for hydro and no cortical thinning.  - Urinalysis bland appears to have no evidence of any activity.  Not c/w GN or AIN. Continue to avoid nephrotoxins, ACE inhibitors ARB's, NSAIDs and contrast if possible. - CK doesn't suggest rhabdo. - I will have to contact the primary to see if there have been any recent BMETs. - CXR as well - Wonder if this is hemodynamically mediated as his BP is on the low side and he is dizzy.    Hyponatremia with hypovolemia we will check urine osmolality and urine sodium..  Replete with normal saline at this present time and continue to monitor serum sodium. HCTZ could have been contributing. - FeNA >3% - Ur Osm is 226 which should be lower but is still lower than the serum (calculated) - Bmet requested. - Appears to be euvolemic hyponatremia and if this is SIADH then have to be careful with giving saline. - Will check TSH also to complte w/u and also a serum osmolality.   Hyperlipidemia   Systemic hypotension recommend 2D echo as well as a a.m. cortisol.  Patient  does not appear to be septic but will check blood cultures x2.    Hypoglycemia per primary team  Anemia does not appear to be an issue at this time.  Remote history of prostate cancer Gleason stage VII 2010 status post prostatectomy.  Metabolic acidosis - on IV bicarbonate at 50 cc an hour.  Subjective:   Feels fine but at home was dizzy and thirsty. Denies Pain, nausea, NSAID use.   Objective:   BP 92/67 (BP Location: Left Arm)   Pulse (!) 57   Temp 98.5 F (36.9 C) (Oral)   Resp 17   Ht 5' 6.5" (1.689 m)   Wt 71.9 kg Comment: with left leg prosthetic that weighs about 15lbs he said.   SpO2 100%   BMI 25.20 kg/m   Intake/Output Summary (Last 24 hours) at 03/17/2019 1006 Last data filed at 03/17/2019 0700 Gross per 24 hour  Intake 2532.79 ml  Output 1250 ml  Net 1282.79 ml   Weight change:   Physical Exam: GEN: NAD, A&Ox3, NCAT HEENT: No conjunctival pallor, EOMI NECK: Supple, no thyromegaly LUNGS: CTA B/L no rales, rhonchi or wheezing CV: RRR, No M/R/G ABD: SNDNT +BS  EXT: No lower extremity edema    Imaging: US Renal  Result Date: 03/16/2019 CLINICAL DATA:  Acute renal failure. EXAM: RENAL / URINARY TRACT ULTRASOUND COMPLETE COMPARISON:  07/17/2017 FINDINGS: Right Kidney: Renal measurements: 10.0 x 4.5 x 5.2 cm = volume: 121.9 mL. Normal renal cortical thickness and echogenicity without focal lesions or hydronephrosis. Left Kidney: Renal  measurements: 9.8 x 5.6 x 4.7 cm = volume: 135.1 mL. Normal renal cortical thickness and echogenicity without worrisome lesions or hydronephrosis. Two small renal cysts are noted. Bladder: Normal. IMPRESSION: Normal renal ultrasound examination. Two small simple left renal cysts are noted incidentally. Electronically Signed   By: Marijo Sanes M.D.   On: 03/16/2019 14:36    Labs: BMET Recent Labs  Lab 03/16/19 1227 03/16/19 1233  NA 123* 121*  K 3.8 3.8  CL 85*  --   CO2 16*  --   GLUCOSE 142*  --   BUN 77*  --    CREATININE 10.49*  --   CALCIUM 9.4  --    CBC Recent Labs  Lab 03/16/19 1227 03/16/19 1233  WBC 4.7  --   NEUTROABS 3.2  --   HGB 10.7* 11.2*  HCT 31.3* 33.0*  MCV 80.9  --   PLT 247  --     Medications:    . docusate sodium  100 mg Oral BID  . feeding supplement (NEPRO CARB STEADY)  237 mL Oral BID BM  . heparin  5,000 Units Subcutaneous Q8H  . insulin aspart  0-9 Units Subcutaneous TID WC      Otelia Santee, MD 03/17/2019, 10:06 AM

## 2019-03-17 NOTE — Progress Notes (Signed)
PROGRESS NOTE  Evan Chambers YBO:[id number] DOB: 1961-05-18 DOA: 03/16/2019 PCP: Lucianne Lei, MD   LOS: 1 day   Patient is from: Home  Brief Narrative / Interim history: 58 y.o. male with history of prostate Ca and HTN presenting from PCP office with AKI, emesis, fatigue and presyncope.  Patient is on ARB/HCTZ for hypertension.  Denies NSAID use.  In ED, hemodynamically stable on arrival but blood pressure dropped to 77/50 later on.  Sodium 123.  Creatinine 10.4.  BUN 77.  Bicarb 16.  Hemoglobin 10.7.  Started on IV fluid.  Nephrology consulted.    Subjective: No major events overnight of this morning.  No complaint this morning.  Denies chest pain, dyspnea, abdominal pain, nausea, vomiting or urinary symptoms.  About 1.2 L urine output since admission.   Assessment & Plan: AKI: Likely a combination of prerenal etiology in the setting of emesis and ATN due to ARB/HCTZ.  Could have underlying CKD.  Renal ultrasound consistent with CKD.  Urine study not impressive.  CK within normal range.  Last serum creatinine before this admission 1.61 in 2018 when he was discharged after hospitalization for AKI. -Nephrology following. -Continue IV normal saline -Avoid nephrotoxic meds. -Follow renal function.  Not drawn yet.  Hyponatremia: Sodium 123 on admission.  Likely due to renal failure, ARB and HCTZ. -Hold ARB/HCTZ -IV normal saline as above  Non-anion gap metabolic acidosis: Likely due to renal failure. -Bicarb per nephrology  Presyncope: Likely due to the above.  EKG normal sinus rhythm without arrhythmia or acute ischemic finding.  Troponin negative. -IV fluid as above -Follow echocardiogram  Hypertension: Normotensive -Home ARB/HCTZ on hold.  History of prostate cancer: Status post laparoscopic radical retropubic prostatectomy for T1c adenocarcinoma in 2012. -No LUTS.  Fairly controlled NIDDM-2: A1c 7.0.  CBG within normal range.  On metformin at home. -Hold home  metformin -SSI -CBG monitoring -Resume home statin.  CK within normal range.  Anemia of chronic disease: -Continue monitoring  Scheduled Meds:  docusate sodium  100 mg Oral BID   feeding supplement (NEPRO CARB STEADY)  237 mL Oral BID BM   heparin  5,000 Units Subcutaneous Q8H   insulin aspart  0-9 Units Subcutaneous TID WC   Continuous Infusions:  sodium chloride 75 mL/hr at 03/17/19 0502    sodium bicarbonate (isotonic) infusion in sterile water 50 mL/hr at 03/16/19 1709   PRN Meds:.acetaminophen **OR** acetaminophen, ondansetron **OR** ondansetron (ZOFRAN) IV    DVT prophylaxis: Subcu heparin Code Status: Full code Family Communication: Patient to let me know if family has questions. Disposition Plan: Remains inpatient for acute renal failure and hyponatremia.  Consultants:   Nephrology  Procedures:   None  Microbiology:  Blood cultures no growth so far.  COVID-19 screen negative   Antimicrobials: Anti-infectives (From admission, onward)   None       Objective: Vitals:   03/16/19 1632 03/16/19 2110 03/17/19 0456 03/17/19 0822  BP: 98/66 (!) 92/58 92/67 100/69  Pulse: 63 73 (!) 57 62  Resp: 18 17 17 18   Temp: 98.3 F (36.8 C) 98.5 F (36.9 C) 98.5 F (36.9 C) 98.4 F (36.9 C)  TempSrc: Oral Oral Oral Oral  SpO2: 98% 100% 100% 98%  Weight:  71.9 kg    Height:        Intake/Output Summary (Last 24 hours) at 03/17/2019 1034 Last data filed at 03/17/2019 0700 Gross per 24 hour  Intake 2532.79 ml  Output 1250 ml  Net 1282.79 ml  Filed Weights   03/16/19 1205 03/16/19 2110  Weight: 72.1 kg 71.9 kg    Examination:  GENERAL: No acute distress.  Appears well.  HEENT: MMM.  Vision and hearing grossly intact.  NECK: Supple.  No JVD.  LUNGS:  No IWOB. Good air movement bilaterally. HEART:  RRR. Heart sounds normal.  ABD: Bowel sounds present. Soft. Non tender.  MSK/EXT:  Moves all extremities. No apparent deformity. No edema bilaterally.   SKIN: no apparent skin lesion or wound NEURO: Awake, alert and oriented appropriately.  No gross deficit.  PSYCH: Calm. Normal affect.    Data Reviewed: I have independently reviewed following labs and imaging studies   CBC: Recent Labs  Lab 03/16/19 1227 03/16/19 1233  WBC 4.7  --   NEUTROABS 3.2  --   HGB 10.7* 11.2*  HCT 31.3* 33.0*  MCV 80.9  --   PLT 247  --    Basic Metabolic Panel: Recent Labs  Lab 03/16/19 1227 03/16/19 1233  NA 123* 121*  K 3.8 3.8  CL 85*  --   CO2 16*  --   GLUCOSE 142*  --   BUN 77*  --   CREATININE 10.49*  --   CALCIUM 9.4  --    GFR: Estimated Creatinine Clearance: 7.1 mL/min (A) (by C-G formula based on SCr of 10.49 mg/dL (H)). Liver Function Tests: Recent Labs  Lab 03/16/19 1227  AST 18  ALT 15  ALKPHOS 52  BILITOT 1.6*  PROT 7.9  ALBUMIN 4.3   No results for input(s): LIPASE, AMYLASE in the last 168 hours. No results for input(s): AMMONIA in the last 168 hours. Coagulation Profile: No results for input(s): INR, PROTIME in the last 168 hours. Cardiac Enzymes: Recent Labs  Lab 03/16/19 1646  CKTOTAL 123  TROPONINI <0.03   BNP (last 3 results) No results for input(s): PROBNP in the last 8760 hours. HbA1C: Recent Labs    03/16/19 1646  HGBA1C 7.0*   CBG: Recent Labs  Lab 03/16/19 1636 03/16/19 2125 03/17/19 0653  GLUCAP 101* 167* 89   Lipid Profile: No results for input(s): CHOL, HDL, LDLCALC, TRIG, CHOLHDL, LDLDIRECT in the last 72 hours. Thyroid Function Tests: No results for input(s): TSH, T4TOTAL, FREET4, T3FREE, THYROIDAB in the last 72 hours. Anemia Panel: No results for input(s): VITAMINB12, FOLATE, FERRITIN, TIBC, IRON, RETICCTPCT in the last 72 hours. Urine analysis:    Component Value Date/Time   COLORURINE STRAW (A) 03/16/2019 1450   APPEARANCEUR CLEAR 03/16/2019 1450   LABSPEC 1.005 03/16/2019 1450   PHURINE 5.0 03/16/2019 1450   GLUCOSEU 50 (A) 03/16/2019 1450   HGBUR SMALL (A)  03/16/2019 1450   BILIRUBINUR NEGATIVE 03/16/2019 1450   KETONESUR NEGATIVE 03/16/2019 1450   PROTEINUR NEGATIVE 03/16/2019 1450   NITRITE NEGATIVE 03/16/2019 1450   LEUKOCYTESUR NEGATIVE 03/16/2019 1450   Sepsis Labs: Invalid input(s): PROCALCITONIN, LACTICIDVEN  Recent Results (from the past 240 hour(s))  SARS Coronavirus 2     Status: None   Collection Time: 03/16/19 12:27 PM  Result Value Ref Range Status   SARS Coronavirus 2 NOT DETECTED NOT DETECTED Final    Comment: (NOTE) SARS-CoV-2 target nucleic acids are NOT DETECTED. The SARS-CoV-2 RNA is generally detectable in upper and lower respiratory specimens during the acute phase of infection.  Negative  results do not preclude SARS-CoV-2 infection, do not rule out co-infections with other pathogens, and should not be used as the sole basis for treatment or other patient management decisions.  Negative  results must be combined with clinical observations, patient history, and epidemiological information. The expected result is Not Detected. Fact Sheet for Patients: http://www.biofiredefense.com/wp-content/uploads/2020/03/BIOFIRE-COVID -19-patients.pdf Fact Sheet for Healthcare Providers: http://www.biofiredefense.com/wp-content/uploads/2020/03/BIOFIRE-COVID -19-hcp.pdf This test is not yet approved or cleared by the [place] and  has been authorized for detection and/or diagnosis of SARS-CoV-2 by FDA under an Emergency Use Authorization (EUA).  This EUA will remain in effec t (meaning this test can be used) for the duration of  the COVID-19 declaration under Section 564(b)(1) of the Act, 21 U.S.C. section 360bbb-3(b)(1), unless the authorization is terminated or revoked sooner. Performed at [facility], [place] [address] [place], [place] [address]   Culture, blood (Routine X 2) w Reflex to ID Panel     Status: None (Preliminary result)   Collection Time: 03/16/19  4:40 PM   Specimen: BLOOD  Result Value  Ref Range Status   Specimen Description BLOOD LEFT ANTECUBITAL  Final   Special Requests   Final    BOTTLES DRAWN AEROBIC ONLY Blood Culture adequate volume   Culture   Final    NO GROWTH < 24 HOURS Performed at [facility], [place] [address] [place], [place] [address]    Report Status PENDING  Incomplete  Culture, blood (Routine X 2) w Reflex to ID Panel     Status: None (Preliminary result)   Collection Time: 03/16/19  4:45 PM   Specimen: BLOOD RIGHT HAND  Result Value Ref Range Status   Specimen Description BLOOD RIGHT HAND  Final   Special Requests   Final    BOTTLES DRAWN AEROBIC ONLY Blood Culture results may not be optimal due to an inadequate volume of blood received in culture bottles   Culture   Final    NO GROWTH < 24 HOURS Performed at [facility], [place] [address], [place], [place] [address]    Report Status PENDING  Incomplete      Radiology Studies: US Renal  Result Date: 03/16/2019 CLINICAL DATA:  Acute renal failure. EXAM: RENAL / URINARY TRACT ULTRASOUND COMPLETE COMPARISON:  07/17/2017 FINDINGS: Right Kidney: Renal measurements: 10.0 x 4.5 x 5.2 cm = volume: 121.9 mL. Normal renal cortical thickness and echogenicity without focal lesions or hydronephrosis. Left Kidney: Renal measurements: 9.8 x 5.6 x 4.7 cm = volume: 135.1 mL. Normal renal cortical thickness and echogenicity without worrisome lesions or hydronephrosis. Two small renal cysts are noted. Bladder: Normal. IMPRESSION: Normal renal ultrasound examination. Two small simple left renal cysts are noted incidentally. Electronically Signed   By: Marijo Sanes M.D.   On: 03/16/2019 14:36   35 minutes with more than 50% spent in reviewing records, counseling patient and coordinating care.  Shamirah Ivan T. [facility] Pager [phone]  If 7PM-7AM, please contact night-coverage [email or web address] Password [facility] 03/17/2019, 10:34 AM

## 2019-03-17 NOTE — Plan of Care (Signed)
Pt understand the general educations.

## 2019-03-17 NOTE — Progress Notes (Signed)
Echocardiogram 2D Echocardiogram has been performed.  Matilde Bash 03/17/2019, 10:15 AM

## 2019-03-17 NOTE — Progress Notes (Signed)
Patient refused to call his mother for giving updates as he doesn't want her to worry.  He said he already talked to her today and will call her tomorrow for updates in his plan of care.

## 2019-03-18 DIAGNOSIS — E876 Hypokalemia: Secondary | ICD-10-CM

## 2019-03-18 LAB — CBC
HCT: 28.4 % — ABNORMAL LOW (ref 39.0–52.0)
Hemoglobin: 9.7 g/dL — ABNORMAL LOW (ref 13.0–17.0)
MCH: 28 pg (ref 26.0–34.0)
MCHC: 34.2 g/dL (ref 30.0–36.0)
MCV: 81.8 fL (ref 80.0–100.0)
Platelets: 239 10*3/uL (ref 150–400)
RBC: 3.47 MIL/uL — ABNORMAL LOW (ref 4.22–5.81)
RDW: 13.8 % (ref 11.5–15.5)
WBC: 4.3 10*3/uL (ref 4.0–10.5)
nRBC: 0 % (ref 0.0–0.2)

## 2019-03-18 LAB — RENAL FUNCTION PANEL
Albumin: 3.5 g/dL (ref 3.5–5.0)
Anion gap: 12 (ref 5–15)
BUN: 38 mg/dL — ABNORMAL HIGH (ref 6–20)
CO2: 28 mmol/L (ref 22–32)
Calcium: 8.6 mg/dL — ABNORMAL LOW (ref 8.9–10.3)
Chloride: 96 mmol/L — ABNORMAL LOW (ref 98–111)
Creatinine, Ser: 2.81 mg/dL — ABNORMAL HIGH (ref 0.61–1.24)
GFR calc Af Amer: 28 mL/min — ABNORMAL LOW (ref >60)
GFR calc non Af Amer: 24 mL/min — ABNORMAL LOW (ref >60)
Glucose, Bld: 131 mg/dL — ABNORMAL HIGH (ref 70–99)
Phosphorus: 1.7 mg/dL — ABNORMAL LOW (ref 2.5–4.6)
Potassium: 3.2 mmol/L — ABNORMAL LOW (ref 3.5–5.1)
Sodium: 136 mmol/L (ref 135–145)

## 2019-03-18 LAB — GLUCOSE, CAPILLARY
Glucose-Capillary: 90 mg/dL (ref 70–99)
Glucose-Capillary: 110 mg/dL — ABNORMAL HIGH (ref 70–99)
Glucose-Capillary: 150 mg/dL — ABNORMAL HIGH (ref 70–99)
Glucose-Capillary: 121 mg/dL — ABNORMAL HIGH (ref 70–99)

## 2019-03-18 LAB — CREATININE, SERUM
Creatinine, Ser: 2.51 mg/dL — ABNORMAL HIGH (ref 0.61–1.24)
GFR calc Af Amer: 32 mL/min — ABNORMAL LOW (ref >60)
GFR calc non Af Amer: 27 mL/min — ABNORMAL LOW (ref >60)

## 2019-03-18 LAB — MAGNESIUM: Magnesium: 1.6 mg/dL — ABNORMAL LOW (ref 1.7–2.4)

## 2019-03-18 MED ORDER — ROSUVASTATIN CALCIUM 5 MG PO TABS
10.0000 mg | ORAL_TABLET | Freq: Every day | ORAL | Status: DC
  Administered 2019-03-18 – 2019-03-19 (×2): 10 mg via ORAL
  Filled 2019-03-18 (×2): qty 2

## 2019-03-18 MED ORDER — MAGNESIUM SULFATE 2 GM/50ML IV SOLN
2.0000 g | Freq: Once | INTRAVENOUS | Status: AC
  Administered 2019-03-18: 2 g via INTRAVENOUS
  Filled 2019-03-18: qty 50

## 2019-03-18 MED ORDER — POTASSIUM PHOSPHATES 15 MMOLE/5ML IV SOLN
30.0000 mmol | Freq: Three times a day (TID) | INTRAVENOUS | Status: AC
  Administered 2019-03-18 – 2019-03-19 (×3): 30 mmol via INTRAVENOUS
  Filled 2019-03-18 (×3): qty 10

## 2019-03-18 NOTE — Progress Notes (Signed)
Pt. Refused to call his wife for update. He will call them himself.

## 2019-03-18 NOTE — Plan of Care (Signed)
  Problem: Health Behavior/Discharge Planning: Goal: Ability to manage health-related needs will improve Outcome: Progressing   Problem: Education: Goal: Knowledge of General Education information will improve Description: Including pain rating scale, medication(s)/side effects and non-pharmacologic comfort measures Outcome: Completed/Met

## 2019-03-18 NOTE — Progress Notes (Signed)
[facility] Progress Note    Assessment/ Plan:   58 year old gentleman with HTN, DM and prostate cancer who was referred by Dr. Criss Rosales to the emergency room due to the findings of electrolyte abnormalities and renal function abnormalities on his chemistry. Reports fatigue weakness presyncopal spells for over a week.  Underwent robot-assisted laparoscopic radical retropubic prostatectomy for T1c adenocarcinoma the prostate 2012. EF 55 to 65% low risk study 2018. Urinalysis bland no red blood cells no white blood cells glucose 50   Acute kidney injury with a creatinine about 1 year ago that was 1.6. History of hypertension with no recent change in medications according to patient but taking ARB irbesartan/HCTZ and amlodipine for blood pressure control. Patient complaining of presyncopal spells. Creatinine elevated in the setting of systemic hypotension.  - Renal ultrasound neg for hydro and no cortical thinning.  - Urinalysis bland appears to have no evidence of any activity. Not c/w GN or AIN. Continue to avoid nephrotoxins, ACE inhibitors ARB's, NSAIDs and contrast if possible. - CK doesn't suggest rhabdo. - Appears to have been hemodynamically mediated. - Renal function is markedly improved  Would avoid a thiazide in this patient as that likely contributed to the hyponatremia.  Stop fluids also and see where pt is at; no nausea and tolerating full diet.  Will sign off at this time; please reconsult as needed.    Hyponatremia with hypovolemia we will check urine osmolality and urine sodium.. Replete with normal saline at this present time and continue to monitor serum sodium. HCTZ could have been contributing. - FeNA >3% - Ur Osm is 226 which should be lower but is still lower than the serum (calculated).  Resolved with fluids suggesting hypovolemic contribution even though FeNA was >3%. May be from a diuretic as well leading to the higher  FeNA.   Hyperlipidemia   Systemic hypotension recommend 2D echo as well as a a.m. cortisol. Patient does not appear to be septic but will check blood cultures x2.    Hypoglycemia per primary team  Anemia does not appear to be an issue at this time.  Remote history of prostate cancer Gleason stage VII 2010 status post prostatectomy.   Subjective:   TOlerating PO's.  Denies Pain, nausea, NSAID use.   Objective:   BP 105/70   Pulse 68   Temp 98.3 F (36.8 C) (Oral)   Resp 17   Ht 5' 6.5" (1.689 m)   Wt 73 kg Comment: has a 15lbs prosthetic on left leg.   SpO2 100%   BMI 25.60 kg/m   Intake/Output Summary (Last 24 hours) at 03/18/2019 1148 Last data filed at 03/18/2019 0935 Gross per 24 hour  Intake 3931.92 ml  Output 2025 ml  Net 1906.92 ml   Weight change: 0.907 kg  Physical Exam: GEN: NAD, A&Ox3, NCAT HEENT: No conjunctival pallor, EOMI NECK: Supple, no thyromegaly LUNGS: CTA B/L no rales, rhonchi or wheezing CV: RRR, No M/R/G ABD: SNDNT +BS  EXT: No lower extremity edema  Imaging: US Renal  Result Date: 03/16/2019 CLINICAL DATA:  Acute renal failure. EXAM: RENAL / URINARY TRACT ULTRASOUND COMPLETE COMPARISON:  07/17/2017 FINDINGS: Right Kidney: Renal measurements: 10.0 x 4.5 x 5.2 cm = volume: 121.9 mL. Normal renal cortical thickness and echogenicity without focal lesions or hydronephrosis. Left Kidney: Renal measurements: 9.8 x 5.6 x 4.7 cm = volume: 135.1 mL. Normal renal cortical thickness and echogenicity without worrisome lesions or hydronephrosis. Two small renal cysts are noted. Bladder: Normal. IMPRESSION: Normal  renal ultrasound examination. Two small simple left renal cysts are noted incidentally. Electronically Signed   By: Marijo Sanes M.D.   On: 03/16/2019 14:36    Labs: BMET Recent Labs  Lab 03/16/19 1227 03/16/19 1233 03/18/19 0938  NA 123* 121* 136  K 3.8 3.8 3.2*  CL 85*  --  96*  CO2 16*  --  28  GLUCOSE 142*  --  131*  BUN 77*   --  38*  CREATININE 10.49*  --  2.81*  CALCIUM 9.4  --  8.6*  PHOS  --   --  1.7*   CBC Recent Labs  Lab 03/16/19 1227 03/16/19 1233 03/18/19 0938  WBC 4.7  --  4.3  NEUTROABS 3.2  --   --   HGB 10.7* 11.2* 9.7*  HCT 31.3* 33.0* 28.4*  MCV 80.9  --  81.8  PLT 247  --  239    Medications:    . docusate sodium  100 mg Oral BID  . feeding supplement (NEPRO CARB STEADY)  237 mL Oral BID BM  . heparin  5,000 Units Subcutaneous Q8H  . insulin aspart  0-9 Units Subcutaneous TID WC      Otelia Santee, MD 03/18/2019, 11:48 AM

## 2019-03-18 NOTE — Progress Notes (Signed)
Pt's mother, Rolan Lipa called and updated on condition.

## 2019-03-18 NOTE — Progress Notes (Signed)
PROGRESS NOTE  Evan Chambers QBH:[id number] DOB: 1961/04/16 DOA: 03/16/2019 PCP: Lucianne Lei, MD   LOS: 2 days   Patient is from: Home  Brief Narrative / Interim history: 58 y.o. male with history of prostate Ca and HTN presenting from PCP office with AKI, emesis, fatigue and presyncope.  Patient is on ARB/HCTZ for hypertension.  Denies NSAID use.  In ED, hemodynamically stable on arrival but blood pressure dropped to 77/50 later on.  Sodium 123.  Creatinine 10.4.  BUN 77.  Bicarb 16.  Hemoglobin 10.7.  Started on IV fluid.  Nephrology consulted.   The next day, serum creatinine down to 2.81.   Subjective: No major events overnight of this morning.  Serum creatinine down to 2.81.  Excellent urine output.  Echocardiogram basically normal.  No complaint this morning.   Assessment & Plan: AKI: Likely a combination of prerenal etiology in the setting of emesis and ATN due to ARB/HCTZ.  Could have underlying CKD.  Renal ultrasound and urine study not impressive. CK within normal range.  Last serum creatinine before this admission 1.61 in 2018 when he was discharged after hospitalization for AKI. Serum creatinine down to 2.81 today.  -Nephrology following. -Continue IV normal saline -Avoid nephrotoxic meds. -Repeat renal function in the morning  Hyponatremia: Resolved.  Sodium 123 on admission.  Likely due to renal failure, ARB and HCTZ. -Hold ARB/HCTZ -IV normal saline as above   Hypokalemia/hypomagnesemia/hypophosphatemia -Replenish and recheck.  Non-anion gap metabolic acidosis: Likely due to renal failure.  Resolved. -Discontinue bicarb  Presyncope: Likely due to the above.  EKG normal sinus rhythm without arrhythmia or acute ischemic finding.  Troponin negative.  Echocardiogram basically normal. -IV fluid as above  Hypertension: Normotensive -Home ARB/HCTZ on hold.  History of prostate cancer: Status post laparoscopic radical retropubic prostatectomy for T1c  adenocarcinoma in 2012. -No LUTS.  Fairly controlled NIDDM-2: A1c 7.0.  CBG within normal range.  On metformin at home. -Hold home metformin -SSI -CBG monitoring -Resumed home statin.  CK within normal range.  Anemia of chronic disease: Hemoglobin is stable. -Continue monitoring  Scheduled Meds: . docusate sodium  100 mg Oral BID  . feeding supplement (NEPRO CARB STEADY)  237 mL Oral BID BM  . heparin  5,000 Units Subcutaneous Q8H  . insulin aspart  0-9 Units Subcutaneous TID WC   Continuous Infusions: . sodium chloride 75 mL/hr at 03/18/19 1005  . magnesium sulfate bolus IVPB    . potassium PHOSPHATE IVPB (in mmol)    .  sodium bicarbonate (isotonic) infusion in sterile water 50 mL/hr at 03/17/19 1422   PRN Meds:.acetaminophen **OR** acetaminophen, ondansetron **OR** ondansetron (ZOFRAN) IV    DVT prophylaxis: Subcu heparin Code Status: Full code Family Communication: Patient to let me know if family has questions. Disposition Plan: Remains inpatient for acute renal failure and significant electrolyte derangements  Consultants:   Nephrology  Procedures:   None  Microbiology: . Blood cultures no growth so far. [facility] Kitchen COVID-19 screen negative   Antimicrobials: Anti-infectives (From admission, onward)   None      Objective: Vitals:   03/17/19 1749 03/17/19 2021 03/18/19 0439 03/18/19 0929  BP: (!) 88/61 90/65 103/74 105/70  Pulse: 68 69 63 68  Resp: 18 17 17    Temp: 98 F (36.7 C) 98.4 F (36.9 C) 98.6 F (37 C) 98.3 F (36.8 C)  TempSrc: Oral Oral Oral Oral  SpO2: 100% 100% 100% 100%  Weight:  73 kg    Height:  Intake/Output Summary (Last 24 hours) at 03/18/2019 1118 Last data filed at 03/18/2019 0935 Gross per 24 hour  Intake 3931.92 ml  Output 2025 ml  Net 1906.92 ml   Filed Weights   03/16/19 1205 03/16/19 2110 03/17/19 2021  Weight: 72.1 kg 71.9 kg 73 kg    Examination:  GENERAL: No acute distress.  Appears well.  HEENT: MMM.   Vision and hearing grossly intact.  NECK: Supple.  No JVD.  LUNGS:  No IWOB. Good air movement bilaterally. HEART:  RRR. Heart sounds normal.  ABD: Bowel sounds present. Soft. Non tender.  MSK/EXT:  Moves all extremities. No apparent deformity. No edema bilaterally.  SKIN: no apparent skin lesion or wound NEURO: Awake, alert and oriented appropriately.  No gross deficit.  PSYCH: Calm. Normal affect.  Data Reviewed: I have independently reviewed following labs and imaging studies   CBC: Recent Labs  Lab 03/16/19 1227 03/16/19 1233 03/18/19 0938  WBC 4.7  --  4.3  NEUTROABS 3.2  --   --   HGB 10.7* 11.2* 9.7*  HCT 31.3* 33.0* 28.4*  MCV 80.9  --  81.8  PLT 247  --  865   Basic Metabolic Panel: Recent Labs  Lab 03/16/19 1227 03/16/19 1233 03/18/19 0938  NA 123* 121* 136  K 3.8 3.8 3.2*  CL 85*  --  96*  CO2 16*  --  28  GLUCOSE 142*  --  131*  BUN 77*  --  38*  CREATININE 10.49*  --  2.81*  CALCIUM 9.4  --  8.6*  MG  --   --  1.6*  PHOS  --   --  1.7*   GFR: Estimated Creatinine Clearance: 26.7 mL/min (A) (by C-G formula based on SCr of 2.81 mg/dL (H)). Liver Function Tests: Recent Labs  Lab 03/16/19 1227 03/18/19 0938  AST 18  --   ALT 15  --   ALKPHOS 52  --   BILITOT 1.6*  --   PROT 7.9  --   ALBUMIN 4.3 3.5   No results for input(s): LIPASE, AMYLASE in the last 168 hours. No results for input(s): AMMONIA in the last 168 hours. Coagulation Profile: No results for input(s): INR, PROTIME in the last 168 hours. Cardiac Enzymes: Recent Labs  Lab 03/16/19 1646  CKTOTAL 123  TROPONINI <0.03   BNP (last 3 results) No results for input(s): PROBNP in the last 8760 hours. HbA1C: Recent Labs    03/16/19 1646  HGBA1C 7.0*   CBG: Recent Labs  Lab 03/17/19 0653 03/17/19 1108 03/17/19 1640 03/17/19 2154 03/18/19 0650  GLUCAP 89 116* 126* 165* 90   Lipid Profile: No results for input(s): CHOL, HDL, LDLCALC, TRIG, CHOLHDL, LDLDIRECT in the last 72  hours. Thyroid Function Tests: Recent Labs    03/17/19 1154  TSH 1.226   Anemia Panel: No results for input(s): VITAMINB12, FOLATE, FERRITIN, TIBC, IRON, RETICCTPCT in the last 72 hours. Urine analysis:    Component Value Date/Time   COLORURINE STRAW (A) 03/16/2019 1450   APPEARANCEUR CLEAR 03/16/2019 1450   LABSPEC 1.005 03/16/2019 1450   PHURINE 5.0 03/16/2019 1450   GLUCOSEU 50 (A) 03/16/2019 1450   HGBUR SMALL (A) 03/16/2019 1450   BILIRUBINUR NEGATIVE 03/16/2019 1450   KETONESUR NEGATIVE 03/16/2019 1450   PROTEINUR NEGATIVE 03/16/2019 1450   NITRITE NEGATIVE 03/16/2019 1450   LEUKOCYTESUR NEGATIVE 03/16/2019 1450   Sepsis Labs: Invalid input(s): PROCALCITONIN, LACTICIDVEN  Recent Results (from the past 240 hour(s))  SARS Coronavirus 2  Status: None   Collection Time: 03/16/19 12:27 PM  Result Value Ref Range Status   SARS Coronavirus 2 NOT DETECTED NOT DETECTED Final    Comment: (NOTE) SARS-CoV-2 target nucleic acids are NOT DETECTED. The SARS-CoV-2 RNA is generally detectable in upper and lower respiratory specimens during the acute phase of infection.  Negative  results do not preclude SARS-CoV-2 infection, do not rule out co-infections with other pathogens, and should not be used as the sole basis for treatment or other patient management decisions.  Negative results must be combined with clinical observations, patient history, and epidemiological information. The expected result is Not Detected. Fact Sheet for Patients: http://www.biofiredefense.com/wp-content/uploads/2020/03/BIOFIRE-COVID -19-patients.pdf Fact Sheet for Healthcare Providers: http://www.biofiredefense.com/wp-content/uploads/2020/03/BIOFIRE-COVID -19-hcp.pdf This test is not yet approved or cleared by the [place] and  has been authorized for detection and/or diagnosis of SARS-CoV-2 by FDA under an Emergency Use Authorization (EUA).  This EUA will remain in effec t (meaning this  test can be used) for the duration of  the COVID-19 declaration under Section 564(b)(1) of the Act, 21 U.S.C. section 360bbb-3(b)(1), unless the authorization is terminated or revoked sooner. Performed at [facility], [place] [address], [place], [place] [address]   Culture, blood (Routine X 2) w Reflex to ID Panel     Status: None (Preliminary result)   Collection Time: 03/16/19  4:40 PM   Specimen: BLOOD  Result Value Ref Range Status   Specimen Description BLOOD LEFT ANTECUBITAL  Final   Special Requests   Final    BOTTLES DRAWN AEROBIC ONLY Blood Culture adequate volume   Culture   Final    NO GROWTH 2 DAYS Performed at [facility], [place] [address] [place], [place] [address]    Report Status PENDING  Incomplete  Culture, blood (Routine X 2) w Reflex to ID Panel     Status: None (Preliminary result)   Collection Time: 03/16/19  4:45 PM   Specimen: BLOOD RIGHT HAND  Result Value Ref Range Status   Specimen Description BLOOD RIGHT HAND  Final   Special Requests   Final    BOTTLES DRAWN AEROBIC ONLY Blood Culture results may not be optimal due to an inadequate volume of blood received in culture bottles   Culture   Final    NO GROWTH 2 DAYS Performed at [facility], [place] [address] [place], [place] [address]    Report Status PENDING  Incomplete      Radiology Studies: No results found.    Linford [facility] Pager [phone]  If 7PM-7AM, please contact night-coverage [email or web address] Password [email or web address] 03/18/2019, 11:18 AM

## 2019-03-19 LAB — RENAL FUNCTION PANEL
Albumin: 3.4 g/dL — ABNORMAL LOW (ref 3.5–5.0)
Anion gap: 11 (ref 5–15)
BUN: 24 mg/dL — ABNORMAL HIGH (ref 6–20)
CO2: 23 mmol/L (ref 22–32)
Calcium: 8.6 mg/dL — ABNORMAL LOW (ref 8.9–10.3)
Chloride: 101 mmol/L (ref 98–111)
Creatinine, Ser: 1.82 mg/dL — ABNORMAL HIGH (ref 0.61–1.24)
GFR calc Af Amer: 47 mL/min — ABNORMAL LOW (ref >60)
GFR calc non Af Amer: 40 mL/min — ABNORMAL LOW (ref >60)
Glucose, Bld: 95 mg/dL (ref 70–99)
Phosphorus: 6.3 mg/dL — ABNORMAL HIGH (ref 2.5–4.6)
Potassium: 3.9 mmol/L (ref 3.5–5.1)
Sodium: 135 mmol/L (ref 135–145)

## 2019-03-19 LAB — MAGNESIUM: Magnesium: 1.4 mg/dL — ABNORMAL LOW (ref 1.7–2.4)

## 2019-03-19 LAB — CBC
HCT: 28.8 % — ABNORMAL LOW (ref 39.0–52.0)
Hemoglobin: 9.6 g/dL — ABNORMAL LOW (ref 13.0–17.0)
MCH: 27.7 pg (ref 26.0–34.0)
MCHC: 33.3 g/dL (ref 30.0–36.0)
MCV: 83.2 fL (ref 80.0–100.0)
Platelets: 235 10*3/uL (ref 150–400)
RBC: 3.46 MIL/uL — ABNORMAL LOW (ref 4.22–5.81)
RDW: 14.2 % (ref 11.5–15.5)
WBC: 4.6 10*3/uL (ref 4.0–10.5)
nRBC: 0 % (ref 0.0–0.2)

## 2019-03-19 LAB — GLUCOSE, CAPILLARY
Glucose-Capillary: 108 mg/dL — ABNORMAL HIGH (ref 70–99)
Glucose-Capillary: 109 mg/dL — ABNORMAL HIGH (ref 70–99)

## 2019-03-19 MED ORDER — METFORMIN HCL 500 MG PO TABS: 500.0000 mg | ORAL_TABLET | Freq: Two times a day (BID) | ORAL | 0 refills | Status: AC

## 2019-03-19 MED ORDER — MAGNESIUM SULFATE 2 GM/50ML IV SOLN
2.0000 g | Freq: Once | INTRAVENOUS | Status: AC
  Administered 2019-03-19: 2 g via INTRAVENOUS
  Filled 2019-03-19: qty 50

## 2019-03-19 NOTE — Plan of Care (Signed)
  Problem: Clinical Measurements: Goal: Diagnostic test results will improve Outcome: Completed/Met

## 2019-03-19 NOTE — Progress Notes (Signed)
DISCHARGE NOTE Evan Chambers [place] to be discharged Home per MD order. Patient verbalized understanding.  Skin clean, dry and intact without evidence of skin break down, no evidence of skin tears noted. IV catheter discontinued intact. Site without signs and symptoms of complications. Dressing and pressure applied. Pt denies pain at the site currently. No complaints noted.  Patient free of lines, drains, and wounds.   Discharge packet assembled. An After Visit Summary (AVS) was printed and given to patient. Patient escorted via wheelchair and discharged to home via private auto. Babs Sciara, RN

## 2019-03-19 NOTE — Discharge Summary (Signed)
Physician Discharge Summary  Evan Chambers VPX:[id number] DOB: November 23, 1960 DOA: 03/16/2019  PCP: Lucianne Lei, MD  Admit date: 03/16/2019 Discharge date: 03/19/2019  Admitted From: Home Disposition: Home  Recommendations for Outpatient Follow-up:  1. Follow up with PCP in 1-2 weeks 2. Please obtain CBC/BMP/Mag/phosphorus at follow up 3. Please follow up on the following pending results: None  Home Health: None Equipment/Devices: None  Discharge Condition: Stable CODE STATUS: Full code  Hospital Course: 58 y.o.malewith history ofprostate Ca and HTN presenting from PCP office with AKI, emesis, fatigue and presyncope.  Patient is on ARB/HCTZ for hypertension.  Denies NSAID use.  In ED, hemodynamically stable on arrival but blood pressure dropped to 77/50 later on.  Sodium 123.  Creatinine 10.4.  BUN 77.  Bicarb 16.  Hemoglobin 10.7.  Started on IV fluid.  Nephrology consulted.   He was continued on IV fluid and IV bicarb.  The next day, serum creatinine down to 2.55.  IV fluid and IV bicarb discontinued.   On the day of discharge, serum creatinine down to 1.82.  Metabolic acidosis resolved.  Phosphorus slightly elevated likely from repletion.  Magnesium slightly low at 1.4 but repleted prior to discharge.  See individual problem list below for more.  Discharge Diagnoses:  AKI: Likely a combination of prerenal etiology in the setting of emesis and ATN due to ARB/HCTZ.  Could have underlying CKD.  Renal ultrasound and urine study not impressive. CK within normal range.  Last serum creatinine before this admission 1.61 in 2018 when he was discharged after hospitalization for AKI. Serum creatinine down to 1.82 on discharge. -Nephrology signed off. -ARB/HCTZ discontinued on discharge. -Repeat BMP/magnesium/phosphorus at follow-up. -Counseled to avoid NSAIDs.  Hyponatremia: Resolved.  Sodium 123 on admission.  Likely due to renal failure, ARB and HCTZ. -ARB/HCTZ discontinued  on discharge   Hypokalemia/hypomagnesemia/hypophosphatemia: Mild hyperphosphatemia after repletion with potassium phosphate.  Hypokalemia resolved.  Hypomagnesemia replenished prior to discharge. -Recheck as well.  Non-anion gap metabolic acidosis: Likely due to renal failure.  Resolved.  Presyncope: Likely due to the above.  EKG normal sinus rhythm without arrhythmia or acute ischemic finding.  Troponin negative.  Echocardiogram basically normal.  Hypertension: Normotensive -ARB/HCTZ discontinued on discharge -Discharged on amlodipine 5 mg daily  History of prostate cancer: Status post laparoscopic radical retropubic prostatectomy for T1c adenocarcinoma in 2012. -No LUTS.  Fairly controlled NIDDM-2: A1c 7.0%.  CBG within normal range.  On metformin at home. -Discharged on metformin 500 mg twice daily and Crestor 10 mg  Anemia of chronic disease: Hemoglobin is stable. -Recheck as needed   Discharge Instructions  Discharge Instructions    Call MD for:  difficulty breathing, headache or visual disturbances   Complete by: As directed    Call MD for:  persistant dizziness or light-headedness   Complete by: As directed    Call MD for:  persistant nausea and vomiting   Complete by: As directed    Call MD for:  temperature >100.4   Complete by: As directed    Diet - low sodium heart healthy   Complete by: As directed    Diet Carb Modified   Complete by: As directed    Discharge instructions   Complete by: As directed    It has been a pleasure taking care of you! You were admitted with kidney failure which was likely from dehydration and your blood pressure medication.  Your kidney has recovered well after we gave you fluids through your veins and we stopped  1 of your blood pressure medication.  At this point, we believe it is safe to let you go home and follow-up with your primary care doctor.  We have made adjustments to your medications in this hospitalization.  Please  review your medication list and the directions before you take your medications. Please follow-up with your primary care doctors in 1 to 2 weeks.  Avoid over-the-counter pain medications except Tylenol.  Once you are discharged, your primary care physician will handle any further medical issues. Please note that NO REFILLS for any discharge medications will be authorized once you are discharged, as it is imperative that you return to your primary care physician (or establish a relationship with a primary care physician if you do not have one) for your aftercare needs so that they can reassess your need for medications and monitor your lab values. Take care,   Increase activity slowly   Complete by: As directed      Allergies as of 03/19/2019   No Known Allergies     Medication List    STOP taking these medications   irbesartan-hydrochlorothiazide 150-12.5 MG tablet Commonly known as: AVALIDE     TAKE these medications   amLODipine 5 MG tablet Commonly known as: NORVASC Take 5 mg by mouth daily.   metFORMIN 500 MG tablet Commonly known as: GLUCOPHAGE Take 1 tablet (500 mg total) by mouth 2 (two) times daily with a meal. What changed: when to take this   rosuvastatin 10 MG tablet Commonly known as: CRESTOR Take 10 mg by mouth daily.      Follow-up Information    Lucianne Lei, MD. Schedule an appointment as soon as possible for a visit in 1 week(s).   Specialty: Family Medicine Contact information: [place] [address] [phone]           Consultations:  Nephrology  Procedures/Studies: 2D Echo: 1. The left ventricle has normal systolic function with an ejection fraction of 60-65%. The cavity size was normal. Left ventricular diastolic parameters were normal.  2. The right ventricle has normal systolic function. The cavity was normal. There is no increase in right ventricular wall thickness.  3. The aortic root and ascending aorta are normal  in size and structure. 4. The interatrial septum was not assessed.   US Renal  Result Date: 03/16/2019 CLINICAL DATA:  Acute renal failure. EXAM: RENAL / URINARY TRACT ULTRASOUND COMPLETE COMPARISON:  07/17/2017 FINDINGS: Right Kidney: Renal measurements: 10.0 x 4.5 x 5.2 cm = volume: 121.9 mL. Normal renal cortical thickness and echogenicity without focal lesions or hydronephrosis. Left Kidney: Renal measurements: 9.8 x 5.6 x 4.7 cm = volume: 135.1 mL. Normal renal cortical thickness and echogenicity without worrisome lesions or hydronephrosis. Two small renal cysts are noted. Bladder: Normal. IMPRESSION: Normal renal ultrasound examination. Two small simple left renal cysts are noted incidentally. Electronically Signed   By: Marijo Sanes M.D.   On: 03/16/2019 14:36     Subjective: No major events overnight of this morning.  No complaint this morning.  Excellent urine output.  Ambulating in the room without problem.  Denies chest pain, dyspnea or lightheadedness.  Ready to go home.   Discharge Exam: Vitals:   03/18/19 2023 03/19/19 0511  BP: 102/77 101/72  Pulse: 66 (!) 59  Resp: 18 18  Temp: 98 F (36.7 C) 97.8 F (36.6 C)  SpO2: 99% 99%    GENERAL: No acute distress.  Appears well.  HEENT: MMM.  Vision and hearing grossly intact.  NECK: Supple.  No JVD.  LUNGS:  No IWOB. Good air movement bilaterally. HEART:  RRR. Heart sounds normal.  ABD: Bowel sounds present. Soft. Non tender.  MSK/EXT:  Moves all extremities. No apparent deformity. No edema bilaterally. SKIN: no apparent skin lesion or wound NEURO: Awake, alert and oriented appropriately.  No gross deficit.  PSYCH: Calm. Normal affect.     The results of significant diagnostics from this hospitalization (including imaging, microbiology, ancillary and laboratory) are listed below for reference.     Microbiology: Recent Results (from the past 240 hour(s))  SARS Coronavirus 2     Status: None   Collection Time:  03/16/19 12:27 PM  Result Value Ref Range Status   SARS Coronavirus 2 NOT DETECTED NOT DETECTED Final    Comment: (NOTE) SARS-CoV-2 target nucleic acids are NOT DETECTED. The SARS-CoV-2 RNA is generally detectable in upper and lower respiratory specimens during the acute phase of infection.  Negative  results do not preclude SARS-CoV-2 infection, do not rule out co-infections with other pathogens, and should not be used as the sole basis for treatment or other patient management decisions.  Negative results must be combined with clinical observations, patient history, and epidemiological information. The expected result is Not Detected. Fact Sheet for Patients: http://www.biofiredefense.com/wp-content/uploads/2020/03/BIOFIRE-COVID -19-patients.pdf Fact Sheet for Healthcare Providers: http://www.biofiredefense.com/wp-content/uploads/2020/03/BIOFIRE-COVID -19-hcp.pdf This test is not yet approved or cleared by the [place] and  has been authorized for detection and/or diagnosis of SARS-CoV-2 by FDA under an Emergency Use Authorization (EUA).  This EUA will remain in effec t (meaning this test can be used) for the duration of  the COVID-19 declaration under Section 564(b)(1) of the Act, 21 U.S.C. section 360bbb-3(b)(1), unless the authorization is terminated or revoked sooner. Performed at [facility], [place] [address] [place], [place] [address]   Culture, blood (Routine X 2) w Reflex to ID Panel     Status: None (Preliminary result)   Collection Time: 03/16/19  4:40 PM   Specimen: BLOOD  Result Value Ref Range Status   Specimen Description BLOOD LEFT ANTECUBITAL  Final   Special Requests   Final    BOTTLES DRAWN AEROBIC ONLY Blood Culture adequate volume   Culture   Final    NO GROWTH 3 DAYS Performed at [facility], [place] [address] [place], [place] [address]    Report Status PENDING  Incomplete  Culture, blood (Routine X 2) w Reflex to ID Panel      Status: None (Preliminary result)   Collection Time: 03/16/19  4:45 PM   Specimen: BLOOD RIGHT HAND  Result Value Ref Range Status   Specimen Description BLOOD RIGHT HAND  Final   Special Requests   Final    BOTTLES DRAWN AEROBIC ONLY Blood Culture results may not be optimal due to an inadequate volume of blood received in culture bottles   Culture   Final    NO GROWTH 3 DAYS Performed at [facility], [place] [address] [place], [place] [address]    Report Status PENDING  Incomplete     Labs: BNP (last 3 results) No results for input(s): BNP in the last 8760 hours. Basic Metabolic Panel: Recent Labs  Lab 03/16/19 1227 03/16/19 1233 03/18/19 0938 03/18/19 1358 03/19/19 0724  NA 123* 121* 136  --  135  K 3.8 3.8 3.2*  --  3.9  CL 85*  --  96*  --  101  CO2 16*  --  28  --  23  GLUCOSE 142*  --  131*  --  95  BUN 77*  --  38*  --  24*  CREATININE 10.49*  --  2.81* 2.51* 1.82*  CALCIUM 9.4  --  8.6*  --  8.6*  MG  --   --  1.6*  --  1.4*  PHOS  --   --  1.7*  --  6.3*   Liver Function Tests: Recent Labs  Lab 03/16/19 1227 03/18/19 0938 03/19/19 0724  AST 18  --   --   ALT 15  --   --   ALKPHOS 52  --   --   BILITOT 1.6*  --   --   PROT 7.9  --   --   ALBUMIN 4.3 3.5 3.4*   No results for input(s): LIPASE, AMYLASE in the last 168 hours. No results for input(s): AMMONIA in the last 168 hours. CBC: Recent Labs  Lab 03/16/19 1227 03/16/19 1233 03/18/19 0938 03/19/19 0724  WBC 4.7  --  4.3 4.6  NEUTROABS 3.2  --   --   --   HGB 10.7* 11.2* 9.7* 9.6*  HCT 31.3* 33.0* 28.4* 28.8*  MCV 80.9  --  81.8 83.2  PLT 247  --  239 235   Cardiac Enzymes: Recent Labs  Lab 03/16/19 1646  CKTOTAL 123  TROPONINI <0.03   BNP: Invalid input(s): POCBNP CBG: Recent Labs  Lab 03/18/19 1126 03/18/19 1639 03/18/19 2024 03/19/19 0700 03/19/19 1113  GLUCAP 110* 150* 121* 108* 109*   D-Dimer No results for input(s): DDIMER in the last 72 hours. Hgb A1c Recent  Labs    03/16/19 1646  HGBA1C 7.0*   Lipid Profile No results for input(s): CHOL, HDL, LDLCALC, TRIG, CHOLHDL, LDLDIRECT in the last 72 hours. Thyroid function studies Recent Labs    03/17/19 1154  TSH 1.226   Anemia work up No results for input(s): VITAMINB12, FOLATE, FERRITIN, TIBC, IRON, RETICCTPCT in the last 72 hours. Urinalysis    Component Value Date/Time   COLORURINE STRAW (A) 03/16/2019 1450   APPEARANCEUR CLEAR 03/16/2019 1450   LABSPEC 1.005 03/16/2019 1450   PHURINE 5.0 03/16/2019 1450   GLUCOSEU 50 (A) 03/16/2019 1450   HGBUR SMALL (A) 03/16/2019 1450   BILIRUBINUR NEGATIVE 03/16/2019 1450   KETONESUR NEGATIVE 03/16/2019 1450   PROTEINUR NEGATIVE 03/16/2019 1450   NITRITE NEGATIVE 03/16/2019 1450   LEUKOCYTESUR NEGATIVE 03/16/2019 1450   Sepsis Labs Invalid input(s): PROCALCITONIN,  WBC,  LACTICIDVEN   Time coordinating discharge: 35 minutes  SIGNED:  Mercy Riding, MD  [facility] 03/19/2019, 11:42 AM Pager [phone]  If 7PM-7AM, please contact night-coverage [email or web address] Password [email or web address]

## 2019-03-21 LAB — CULTURE, BLOOD (ROUTINE X 2)
Culture: NO GROWTH
Culture: NO GROWTH
Special Requests: ADEQUATE

## 2019-03-22 DIAGNOSIS — R000 Tachycardia, unspecified: Secondary | ICD-10-CM | POA: Diagnosis not present

## 2019-03-22 DIAGNOSIS — E782 Mixed hyperlipidemia: Secondary | ICD-10-CM | POA: Diagnosis not present

## 2019-03-22 DIAGNOSIS — E861 Hypovolemia: Secondary | ICD-10-CM | POA: Diagnosis not present

## 2019-03-22 DIAGNOSIS — I10 Essential (primary) hypertension: Secondary | ICD-10-CM | POA: Diagnosis not present

## 2019-04-05 DIAGNOSIS — E861 Hypovolemia: Secondary | ICD-10-CM | POA: Diagnosis not present

## 2019-04-05 DIAGNOSIS — R000 Tachycardia, unspecified: Secondary | ICD-10-CM | POA: Diagnosis not present

## 2019-04-05 DIAGNOSIS — R009 Unspecified abnormalities of heart beat: Secondary | ICD-10-CM | POA: Diagnosis not present

## 2019-06-07 DIAGNOSIS — I10 Essential (primary) hypertension: Secondary | ICD-10-CM | POA: Diagnosis not present

## 2019-06-07 DIAGNOSIS — M130 Polyarthritis, unspecified: Secondary | ICD-10-CM | POA: Diagnosis not present

## 2019-06-07 DIAGNOSIS — R7301 Impaired fasting glucose: Secondary | ICD-10-CM | POA: Diagnosis not present

## 2019-06-07 DIAGNOSIS — K210 Gastro-esophageal reflux disease with esophagitis: Secondary | ICD-10-CM | POA: Diagnosis not present

## 2019-06-07 DIAGNOSIS — E782 Mixed hyperlipidemia: Secondary | ICD-10-CM | POA: Diagnosis not present

## 2019-06-20 DIAGNOSIS — S0590XA Unspecified injury of unspecified eye and orbit, initial encounter: Secondary | ICD-10-CM | POA: Diagnosis not present

## 2019-11-03 DIAGNOSIS — I10 Essential (primary) hypertension: Secondary | ICD-10-CM | POA: Diagnosis not present

## 2019-12-22 DIAGNOSIS — E785 Hyperlipidemia, unspecified: Secondary | ICD-10-CM | POA: Diagnosis not present

## 2019-12-22 DIAGNOSIS — R000 Tachycardia, unspecified: Secondary | ICD-10-CM | POA: Diagnosis not present

## 2019-12-22 DIAGNOSIS — I10 Essential (primary) hypertension: Secondary | ICD-10-CM | POA: Diagnosis not present

## 2019-12-22 DIAGNOSIS — D649 Anemia, unspecified: Secondary | ICD-10-CM | POA: Diagnosis not present

## 2020-04-05 DIAGNOSIS — S78112S Complete traumatic amputation at level between left hip and knee, sequela: Secondary | ICD-10-CM | POA: Diagnosis not present

## 2020-04-05 DIAGNOSIS — R7309 Other abnormal glucose: Secondary | ICD-10-CM | POA: Diagnosis not present

## 2020-04-05 DIAGNOSIS — E6609 Other obesity due to excess calories: Secondary | ICD-10-CM | POA: Diagnosis not present

## 2020-05-03 DIAGNOSIS — E7849 Other hyperlipidemia: Secondary | ICD-10-CM | POA: Diagnosis not present

## 2020-05-03 DIAGNOSIS — I10 Essential (primary) hypertension: Secondary | ICD-10-CM | POA: Diagnosis not present

## 2020-05-03 DIAGNOSIS — E119 Type 2 diabetes mellitus without complications: Secondary | ICD-10-CM | POA: Diagnosis not present

## 2020-05-31 DIAGNOSIS — E119 Type 2 diabetes mellitus without complications: Secondary | ICD-10-CM | POA: Diagnosis not present

## 2020-08-22 DIAGNOSIS — E782 Mixed hyperlipidemia: Secondary | ICD-10-CM | POA: Diagnosis not present

## 2020-08-22 DIAGNOSIS — K2100 Gastro-esophageal reflux disease with esophagitis, without bleeding: Secondary | ICD-10-CM | POA: Diagnosis not present

## 2020-08-22 DIAGNOSIS — M130 Polyarthritis, unspecified: Secondary | ICD-10-CM | POA: Diagnosis not present

## 2020-08-22 DIAGNOSIS — E1169 Type 2 diabetes mellitus with other specified complication: Secondary | ICD-10-CM | POA: Diagnosis not present

## 2020-08-22 DIAGNOSIS — Z23 Encounter for immunization: Secondary | ICD-10-CM | POA: Diagnosis not present

## 2020-08-22 DIAGNOSIS — I10 Essential (primary) hypertension: Secondary | ICD-10-CM | POA: Diagnosis not present

## 2020-08-22 DIAGNOSIS — C61 Malignant neoplasm of prostate: Secondary | ICD-10-CM | POA: Diagnosis not present

## 2020-09-03 DIAGNOSIS — I10 Essential (primary) hypertension: Secondary | ICD-10-CM | POA: Diagnosis not present

## 2020-09-03 DIAGNOSIS — E7849 Other hyperlipidemia: Secondary | ICD-10-CM | POA: Diagnosis not present

## 2020-09-03 DIAGNOSIS — E119 Type 2 diabetes mellitus without complications: Secondary | ICD-10-CM | POA: Diagnosis not present

## 2020-10-23 DIAGNOSIS — R7309 Other abnormal glucose: Secondary | ICD-10-CM | POA: Diagnosis not present

## 2020-10-23 DIAGNOSIS — E119 Type 2 diabetes mellitus without complications: Secondary | ICD-10-CM | POA: Diagnosis not present

## 2020-10-23 DIAGNOSIS — I10 Essential (primary) hypertension: Secondary | ICD-10-CM | POA: Diagnosis not present

## 2020-10-23 DIAGNOSIS — K2100 Gastro-esophageal reflux disease with esophagitis, without bleeding: Secondary | ICD-10-CM | POA: Diagnosis not present

## 2020-10-23 DIAGNOSIS — D528 Other folate deficiency anemias: Secondary | ICD-10-CM | POA: Diagnosis not present

## 2020-10-23 DIAGNOSIS — E785 Hyperlipidemia, unspecified: Secondary | ICD-10-CM | POA: Diagnosis not present

## 2020-10-23 DIAGNOSIS — R000 Tachycardia, unspecified: Secondary | ICD-10-CM | POA: Diagnosis not present

## 2020-10-23 DIAGNOSIS — D649 Anemia, unspecified: Secondary | ICD-10-CM | POA: Diagnosis not present

## 2020-10-31 DIAGNOSIS — M1711 Unilateral primary osteoarthritis, right knee: Secondary | ICD-10-CM | POA: Diagnosis not present

## 2020-11-02 DIAGNOSIS — E119 Type 2 diabetes mellitus without complications: Secondary | ICD-10-CM | POA: Diagnosis not present

## 2020-11-02 DIAGNOSIS — I10 Essential (primary) hypertension: Secondary | ICD-10-CM | POA: Diagnosis not present

## 2020-11-02 DIAGNOSIS — E7849 Other hyperlipidemia: Secondary | ICD-10-CM | POA: Diagnosis not present

## 2020-11-07 DIAGNOSIS — I10 Essential (primary) hypertension: Secondary | ICD-10-CM | POA: Diagnosis not present

## 2020-11-07 DIAGNOSIS — E7849 Other hyperlipidemia: Secondary | ICD-10-CM | POA: Diagnosis not present

## 2020-11-07 DIAGNOSIS — Z0000 Encounter for general adult medical examination without abnormal findings: Secondary | ICD-10-CM | POA: Diagnosis not present

## 2020-11-07 DIAGNOSIS — R000 Tachycardia, unspecified: Secondary | ICD-10-CM | POA: Diagnosis not present

## 2020-11-07 DIAGNOSIS — D649 Anemia, unspecified: Secondary | ICD-10-CM | POA: Diagnosis not present

## 2020-12-02 DIAGNOSIS — E119 Type 2 diabetes mellitus without complications: Secondary | ICD-10-CM | POA: Diagnosis not present

## 2020-12-02 DIAGNOSIS — I10 Essential (primary) hypertension: Secondary | ICD-10-CM | POA: Diagnosis not present

## 2020-12-02 DIAGNOSIS — E7849 Other hyperlipidemia: Secondary | ICD-10-CM | POA: Diagnosis not present

## 2020-12-04 DIAGNOSIS — Z1212 Encounter for screening for malignant neoplasm of rectum: Secondary | ICD-10-CM | POA: Diagnosis not present

## 2020-12-04 DIAGNOSIS — Z1211 Encounter for screening for malignant neoplasm of colon: Secondary | ICD-10-CM | POA: Diagnosis not present

## 2020-12-08 ENCOUNTER — Emergency Department (HOSPITAL_COMMUNITY)
Admission: EM | Admit: 2020-12-08 | Discharge: 2020-12-09 | Disposition: A | Discharge status: Home / Self Care | Payer: Medicare HMO | Attending: Emergency Medicine | Admitting: Emergency Medicine

## 2020-12-08 ENCOUNTER — Other Ambulatory Visit: Payer: Self-pay

## 2020-12-08 ENCOUNTER — Encounter (HOSPITAL_COMMUNITY): Payer: Self-pay | Admitting: Emergency Medicine

## 2020-12-08 DIAGNOSIS — I213 ST elevation (STEMI) myocardial infarction of unspecified site: Secondary | ICD-10-CM | POA: Diagnosis not present

## 2020-12-08 DIAGNOSIS — Z8546 Personal history of malignant neoplasm of prostate: Secondary | ICD-10-CM | POA: Diagnosis not present

## 2020-12-08 DIAGNOSIS — Z79899 Other long term (current) drug therapy: Secondary | ICD-10-CM | POA: Insufficient documentation

## 2020-12-08 DIAGNOSIS — R410 Disorientation, unspecified: Secondary | ICD-10-CM | POA: Diagnosis not present

## 2020-12-08 DIAGNOSIS — R000 Tachycardia, unspecified: Secondary | ICD-10-CM | POA: Diagnosis not present

## 2020-12-08 DIAGNOSIS — R55 Syncope and collapse: Secondary | ICD-10-CM | POA: Diagnosis not present

## 2020-12-08 DIAGNOSIS — I10 Essential (primary) hypertension: Secondary | ICD-10-CM | POA: Diagnosis not present

## 2020-12-08 LAB — CBC WITH DIFFERENTIAL/PLATELET
Abs Immature Granulocytes: 0.04 10*3/uL (ref 0.00–0.07)
Basophils Absolute: 0.1 10*3/uL (ref 0.0–0.1)
Basophils Relative: 1 %
Eosinophils Absolute: 0 10*3/uL (ref 0.0–0.5)
Eosinophils Relative: 0 %
HCT: 34.2 % — ABNORMAL LOW (ref 39.0–52.0)
Hemoglobin: 11.6 g/dL — ABNORMAL LOW (ref 13.0–17.0)
Immature Granulocytes: 1 %
Lymphocytes Relative: 17 %
Lymphs Abs: 1.2 10*3/uL (ref 0.7–4.0)
MCH: 28.4 pg (ref 26.0–34.0)
MCHC: 33.9 g/dL (ref 30.0–36.0)
MCV: 83.8 fL (ref 80.0–100.0)
Monocytes Absolute: 0.8 10*3/uL (ref 0.1–1.0)
Monocytes Relative: 11 %
Neutro Abs: 5.2 10*3/uL (ref 1.7–7.7)
Neutrophils Relative %: 70 %
Platelets: 198 10*3/uL (ref 150–400)
RBC: 4.08 MIL/uL — ABNORMAL LOW (ref 4.22–5.81)
RDW: 17.9 % — ABNORMAL HIGH (ref 11.5–15.5)
WBC: 7.3 10*3/uL (ref 4.0–10.5)
nRBC: 0 % (ref 0.0–0.2)

## 2020-12-08 LAB — COMPREHENSIVE METABOLIC PANEL
ALT: 21 U/L (ref 0–44)
AST: 28 U/L (ref 15–41)
Albumin: 4.1 g/dL (ref 3.5–5.0)
Alkaline Phosphatase: 56 U/L (ref 38–126)
Anion gap: 15 (ref 5–15)
BUN: 5 mg/dL — ABNORMAL LOW (ref 6–20)
CO2: 21 mmol/L — ABNORMAL LOW (ref 22–32)
Calcium: 9.8 mg/dL (ref 8.9–10.3)
Chloride: 101 mmol/L (ref 98–111)
Creatinine, Ser: 0.92 mg/dL (ref 0.61–1.24)
GFR, Estimated: >60 mL/min (ref >60)
Glucose, Bld: 121 mg/dL — ABNORMAL HIGH (ref 70–99)
Potassium: 3.6 mmol/L (ref 3.5–5.1)
Sodium: 137 mmol/L (ref 135–145)
Total Bilirubin: 1.1 mg/dL (ref 0.3–1.2)
Total Protein: 7.6 g/dL (ref 6.5–8.1)

## 2020-12-08 NOTE — ED Triage Notes (Signed)
Patient arrived with EMS from home brief syncopal episode with lightheadedness this evening , alert and oriented at arrival , respirations unlabored , denies pain , CBG=125 by EMS , no neuro deficits .
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
## 2020-12-09 NOTE — Discharge Instructions (Signed)
Follow-up with your primary care doctor for further evaluation of her syncopal episode.  Be sure to continue eating regular meals and staying well-hydrated.  Return for new or concerning symptoms.

## 2020-12-09 NOTE — ED Notes (Signed)
Orthostatic vitals  Lying 144/90 93 HR  Sitting 95 HR 140/93  Standing 103 HR 164/99

## 2020-12-16 DIAGNOSIS — D644 Congenital dyserythropoietic anemia: Secondary | ICD-10-CM | POA: Diagnosis not present

## 2020-12-16 DIAGNOSIS — R55 Syncope and collapse: Secondary | ICD-10-CM | POA: Diagnosis not present

## 2020-12-16 DIAGNOSIS — E1169 Type 2 diabetes mellitus with other specified complication: Secondary | ICD-10-CM | POA: Diagnosis not present

## 2020-12-16 DIAGNOSIS — E782 Mixed hyperlipidemia: Secondary | ICD-10-CM | POA: Diagnosis not present

## 2020-12-16 DIAGNOSIS — D649 Anemia, unspecified: Secondary | ICD-10-CM | POA: Diagnosis not present

## 2020-12-17 ENCOUNTER — Other Ambulatory Visit: Payer: Self-pay

## 2020-12-17 ENCOUNTER — Ambulatory Visit: Payer: Medicare HMO | Admitting: Cardiology

## 2020-12-17 ENCOUNTER — Encounter: Payer: Self-pay | Admitting: Cardiology

## 2020-12-17 VITALS — BP 165/103 | HR 116 | Temp 98.4°F | Resp 16 | Ht 66.0 in | Wt 167.0 lb

## 2020-12-17 DIAGNOSIS — G9001 Carotid sinus syncope: Secondary | ICD-10-CM | POA: Diagnosis not present

## 2020-12-17 DIAGNOSIS — R55 Syncope and collapse: Secondary | ICD-10-CM | POA: Diagnosis not present

## 2020-12-17 DIAGNOSIS — I10 Essential (primary) hypertension: Secondary | ICD-10-CM
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
## 2021-01-01 ENCOUNTER — Other Ambulatory Visit: Payer: Medicare HMO

## 2021-01-01 ENCOUNTER — Inpatient Hospital Stay: Payer: Medicare HMO

## 2021-01-01 DIAGNOSIS — G9001 Carotid sinus syncope: Secondary | ICD-10-CM

## 2021-01-15 ENCOUNTER — Inpatient Hospital Stay: Payer: Medicare HMO

## 2021-01-15 ENCOUNTER — Other Ambulatory Visit: Payer: Medicare HMO

## 2021-01-15 ENCOUNTER — Ambulatory Visit: Payer: Medicare HMO | Admitting: Cardiology

## 2021-01-16 ENCOUNTER — Ambulatory Visit: Payer: Medicare HMO | Admitting: Cardiology

## 2021-01-28 ENCOUNTER — Inpatient Hospital Stay: Payer: Medicare HMO

## 2021-01-28 ENCOUNTER — Other Ambulatory Visit: Payer: Medicare HMO

## 2021-02-07 ENCOUNTER — Other Ambulatory Visit: Payer: Medicare HMO

## 2021-02-07 ENCOUNTER — Inpatient Hospital Stay: Payer: Medicare HMO

## 2021-02-21 ENCOUNTER — Inpatient Hospital Stay: Payer: Medicare HMO

## 2021-02-21 ENCOUNTER — Other Ambulatory Visit: Payer: Medicare HMO

## 2021-02-21 ENCOUNTER — Ambulatory Visit: Payer: Medicare HMO | Admitting: Cardiology

## 2021-03-11 DIAGNOSIS — Z008 Encounter for other general examination: Secondary | ICD-10-CM | POA: Diagnosis not present

## 2021-03-11 DIAGNOSIS — Z7984 Long term (current) use of oral hypoglycemic drugs: Secondary | ICD-10-CM | POA: Diagnosis not present

## 2021-03-11 DIAGNOSIS — I10 Essential (primary) hypertension: Secondary | ICD-10-CM | POA: Diagnosis not present

## 2021-03-11 DIAGNOSIS — Z8546 Personal history of malignant neoplasm of prostate: Secondary | ICD-10-CM | POA: Diagnosis not present

## 2021-03-11 DIAGNOSIS — Z89612 Acquired absence of left leg above knee: Secondary | ICD-10-CM | POA: Diagnosis not present

## 2021-03-11 DIAGNOSIS — E119 Type 2 diabetes mellitus without complications: Secondary | ICD-10-CM | POA: Diagnosis not present

## 2021-03-28 ENCOUNTER — Encounter: Payer: Self-pay | Admitting: Cardiology

## 2021-03-28 ENCOUNTER — Other Ambulatory Visit: Payer: Self-pay

## 2021-03-28 ENCOUNTER — Telehealth: Payer: Medicare HMO | Admitting: Cardiology

## 2021-03-28 VITALS — Ht 66.0 in | Wt 167.0 lb

## 2021-03-28 DIAGNOSIS — R55 Syncope and collapse: Secondary | ICD-10-CM

## 2021-03-28 NOTE — Progress Notes (Signed)
Patient referred by Lucianne Lei, MD for syncope  Subjective:   Evan Chambers, male    DOB: July 16, 1961, 60 y.o.   MRN: [id number]  I connected with the patient on 02/25/2021 by a telephone call and verified that I am speaking with the correct person using two identifiers.     I offered the patient a video enabled application for a virtual visit. Unfortunately, this could not be accomplished due to technical difficulties/lack of video enabled phone/computer. I discussed the limitations of evaluation and management by telemedicine and the availability of in person appointments. The patient expressed understanding and agreed to proceed.   This visit type was conducted due to national recommendations for restrictions regarding the COVID-19 Pandemic (e.g. social distancing).  This format is felt to be most appropriate for this patient at this time.  All issues noted in this document were discussed and addressed.  No physical exam was performed (except for noted visual exam findings with Tele health visits).  The patient has consented to conduct a Tele health visit and understands insurance will be billed.   Chief Complaint  Patient presents with   Syncope and collapse   Follow-up    4 week     HPI  60 y.o. African Ametican male with hypertension, h/o prostate CA, referred for evaluation of syncope  No repeat syncope episode. He denies chest pain, shortness of breath, palpitations, leg edema, orthopnea, PND, TIA/syncope.   Initial consultation HPI 12/2020: Patient had an episode of syncope while getting his hair cut. He ate his breakfast at 10 AM. Episode occurred around 6 PM. He was sitting upright in the chair, with his head was down and turned to one side. He does not recall any chest pain, shortness of breath, lightheadedness. He reportedly lost consciousness. Hair dresser called 911. Patient regained consciousness by himself in about a min. He was laid in a couch, examined by EMT and  taken to the ER> Workup was essentially unremarkable, other than mildly positive orthostatics and tachycardia.   Current Outpatient Medications on File Prior to Visit  Medication Sig Dispense Refill   amLODipine (NORVASC) 5 MG tablet Take 5 mg by mouth daily.      metFORMIN (GLUCOPHAGE) 500 MG tablet Take 1 tablet (500 mg total) by mouth 2 (two) times daily with a meal. (Patient taking differently: Take 500 mg by mouth.) 180 tablet 0   No current facility-administered medications on file prior to visit.    Cardiovascular and other pertinent studies:  EKG 12/17/2020: Sinus tachycardia 110 bpm  Old anteroseptal infarct Nonspecific ST depression    Recent labs: 12/08/2020: Glucose 121, BUN/Cr 5/0.92. EGFR >60. Na/K 137/3.6. Rest of the CMP normal H/H 11.6/34.2. MCV 83.8. Platelets 198 HbA1C N/A TSH N/A  Lipid panel 2018: Chol 236, TG 74, HDL 80, LDL 141    Review of Systems  Cardiovascular:  Positive for syncope. Negative for chest pain, dyspnea on exertion, leg swelling and palpitations.        Vitals:   Today's Vitals   03/28/21 0930  Weight: 167 lb (75.8 kg)  Height: '5\' 6"'  (1.676 m)   Body mass index is 26.95 kg/m.   Body mass index is 26.95 kg/m. Filed Weights   03/28/21 0930  Weight: 167 lb (75.8 kg)     Objective:   Physical Exam  Not performed. Telephone visit       Assessment & Recommendations:   60 y.o. African Ametican male with hypertension, h/o prostate CA,  referred for evaluation of syncope  Syncope: Given the circumstances of the syncope, suspect carotid sinus syncope. No recurrence. He has not undergone cardiac telemetry, echocardiogram, carotid US. Okay to hold given no recurrence. Encourage liberal hydration. Continue f/u w/PCP.  I will see him as needed.   Nigel Mormon, MD Pager: [phone] Office: [phone]

## 2021-04-03 DIAGNOSIS — E119 Type 2 diabetes mellitus without complications: Secondary | ICD-10-CM | POA: Diagnosis not present

## 2021-04-03 DIAGNOSIS — E7849 Other hyperlipidemia: Secondary | ICD-10-CM | POA: Diagnosis not present

## 2021-04-03 DIAGNOSIS — I10 Essential (primary) hypertension: Secondary | ICD-10-CM | POA: Diagnosis not present

## 2021-06-04 DIAGNOSIS — E7849 Other hyperlipidemia: Secondary | ICD-10-CM | POA: Diagnosis not present

## 2021-06-04 DIAGNOSIS — I10 Essential (primary) hypertension: Secondary | ICD-10-CM | POA: Diagnosis not present

## 2021-06-04 DIAGNOSIS — R7309 Other abnormal glucose: Secondary | ICD-10-CM | POA: Diagnosis not present

## 2021-06-23 ENCOUNTER — Inpatient Hospital Stay (HOSPITAL_COMMUNITY)
Admission: EM | Admit: 2021-06-23 | Discharge: 2021-07-17 | DRG: 163 | Disposition: A | Discharge status: Skilled Nursing Facility | Payer: Medicare HMO | Attending: Family Medicine | Admitting: Family Medicine

## 2021-06-23 ENCOUNTER — Emergency Department (HOSPITAL_COMMUNITY): Payer: Medicare HMO

## 2021-06-23 ENCOUNTER — Observation Stay (HOSPITAL_COMMUNITY): Payer: Medicare HMO

## 2021-06-23 DIAGNOSIS — F0390 Unspecified dementia without behavioral disturbance: Secondary | ICD-10-CM | POA: Diagnosis present

## 2021-06-23 DIAGNOSIS — J439 Emphysema, unspecified: Secondary | ICD-10-CM | POA: Diagnosis present

## 2021-06-23 DIAGNOSIS — J90 Pleural effusion, not elsewhere classified: Secondary | ICD-10-CM

## 2021-06-23 DIAGNOSIS — Z743 Need for continuous supervision: Secondary | ICD-10-CM | POA: Diagnosis not present

## 2021-06-23 DIAGNOSIS — T17590A Other foreign object in bronchus causing asphyxiation, initial encounter: Secondary | ICD-10-CM | POA: Diagnosis not present

## 2021-06-23 DIAGNOSIS — Z79899 Other long term (current) drug therapy: Secondary | ICD-10-CM

## 2021-06-23 DIAGNOSIS — R109 Unspecified abdominal pain: Secondary | ICD-10-CM

## 2021-06-23 DIAGNOSIS — F05 Delirium due to known physiological condition: Secondary | ICD-10-CM | POA: Diagnosis not present

## 2021-06-23 DIAGNOSIS — G9340 Encephalopathy, unspecified: Secondary | ICD-10-CM | POA: Diagnosis present

## 2021-06-23 DIAGNOSIS — Z09 Encounter for follow-up examination after completed treatment for conditions other than malignant neoplasm: Secondary | ICD-10-CM

## 2021-06-23 DIAGNOSIS — R531 Weakness: Secondary | ICD-10-CM | POA: Diagnosis not present

## 2021-06-23 DIAGNOSIS — K828 Other specified diseases of gallbladder: Secondary | ICD-10-CM | POA: Diagnosis present

## 2021-06-23 DIAGNOSIS — E512 Wernicke's encephalopathy: Secondary | ICD-10-CM | POA: Diagnosis present

## 2021-06-23 DIAGNOSIS — E872 Acidosis, unspecified: Secondary | ICD-10-CM | POA: Diagnosis present

## 2021-06-23 DIAGNOSIS — K8020 Calculus of gallbladder without cholecystitis without obstruction: Secondary | ICD-10-CM | POA: Diagnosis present

## 2021-06-23 DIAGNOSIS — E861 Hypovolemia: Secondary | ICD-10-CM | POA: Diagnosis present

## 2021-06-23 DIAGNOSIS — R339 Retention of urine, unspecified: Secondary | ICD-10-CM | POA: Diagnosis present

## 2021-06-23 DIAGNOSIS — Z20822 Contact with and (suspected) exposure to covid-19: Secondary | ICD-10-CM | POA: Diagnosis present

## 2021-06-23 DIAGNOSIS — R410 Disorientation, unspecified: Secondary | ICD-10-CM

## 2021-06-23 DIAGNOSIS — R197 Diarrhea, unspecified: Secondary | ICD-10-CM | POA: Diagnosis not present

## 2021-06-23 DIAGNOSIS — Z602 Problems related to living alone: Secondary | ICD-10-CM | POA: Diagnosis present

## 2021-06-23 DIAGNOSIS — K760 Fatty (change of) liver, not elsewhere classified: Secondary | ICD-10-CM | POA: Diagnosis present

## 2021-06-23 DIAGNOSIS — J690 Pneumonitis due to inhalation of food and vomit: Secondary | ICD-10-CM | POA: Diagnosis present

## 2021-06-23 DIAGNOSIS — E876 Hypokalemia: Secondary | ICD-10-CM | POA: Diagnosis present

## 2021-06-23 DIAGNOSIS — A419 Sepsis, unspecified organism: Secondary | ICD-10-CM

## 2021-06-23 DIAGNOSIS — J9811 Atelectasis: Secondary | ICD-10-CM | POA: Diagnosis present

## 2021-06-23 DIAGNOSIS — R0602 Shortness of breath: Secondary | ICD-10-CM

## 2021-06-23 DIAGNOSIS — R778 Other specified abnormalities of plasma proteins: Secondary | ICD-10-CM | POA: Diagnosis present

## 2021-06-23 DIAGNOSIS — Z6822 Body mass index (BMI) 22.0-22.9, adult: Secondary | ICD-10-CM

## 2021-06-23 DIAGNOSIS — M6282 Rhabdomyolysis: Secondary | ICD-10-CM | POA: Diagnosis present

## 2021-06-23 DIAGNOSIS — Z9714 Presence of artificial left leg (complete) (partial): Secondary | ICD-10-CM

## 2021-06-23 DIAGNOSIS — K567 Ileus, unspecified: Secondary | ICD-10-CM | POA: Diagnosis not present

## 2021-06-23 DIAGNOSIS — R4182 Altered mental status, unspecified: Secondary | ICD-10-CM | POA: Diagnosis not present

## 2021-06-23 DIAGNOSIS — R000 Tachycardia, unspecified: Secondary | ICD-10-CM | POA: Diagnosis not present

## 2021-06-23 DIAGNOSIS — R066 Hiccough: Secondary | ICD-10-CM | POA: Diagnosis not present

## 2021-06-23 DIAGNOSIS — Z7984 Long term (current) use of oral hypoglycemic drugs: Secondary | ICD-10-CM

## 2021-06-23 DIAGNOSIS — Z8249 Family history of ischemic heart disease and other diseases of the circulatory system: Secondary | ICD-10-CM

## 2021-06-23 DIAGNOSIS — K449 Diaphragmatic hernia without obstruction or gangrene: Secondary | ICD-10-CM | POA: Diagnosis present

## 2021-06-23 DIAGNOSIS — Z634 Disappearance and death of family member: Secondary | ICD-10-CM

## 2021-06-23 DIAGNOSIS — R7401 Elevation of levels of liver transaminase levels: Secondary | ICD-10-CM

## 2021-06-23 DIAGNOSIS — Z4682 Encounter for fitting and adjustment of non-vascular catheter: Secondary | ICD-10-CM

## 2021-06-23 DIAGNOSIS — E1169 Type 2 diabetes mellitus with other specified complication: Secondary | ICD-10-CM | POA: Diagnosis present

## 2021-06-23 DIAGNOSIS — J851 Abscess of lung with pneumonia: Secondary | ICD-10-CM | POA: Diagnosis present

## 2021-06-23 DIAGNOSIS — E875 Hyperkalemia: Secondary | ICD-10-CM | POA: Diagnosis not present

## 2021-06-23 DIAGNOSIS — M25561 Pain in right knee: Secondary | ICD-10-CM | POA: Diagnosis present

## 2021-06-23 DIAGNOSIS — R6510 Systemic inflammatory response syndrome (SIRS) of non-infectious origin without acute organ dysfunction: Secondary | ICD-10-CM | POA: Diagnosis present

## 2021-06-23 DIAGNOSIS — E119 Type 2 diabetes mellitus without complications: Secondary | ICD-10-CM

## 2021-06-23 DIAGNOSIS — M50323 Other cervical disc degeneration at C6-C7 level: Secondary | ICD-10-CM | POA: Diagnosis not present

## 2021-06-23 DIAGNOSIS — E860 Dehydration: Secondary | ICD-10-CM

## 2021-06-23 DIAGNOSIS — J939 Pneumothorax, unspecified: Secondary | ICD-10-CM

## 2021-06-23 DIAGNOSIS — I152 Hypertension secondary to endocrine disorders: Secondary | ICD-10-CM | POA: Diagnosis present

## 2021-06-23 DIAGNOSIS — K222 Esophageal obstruction: Secondary | ICD-10-CM | POA: Diagnosis present

## 2021-06-23 DIAGNOSIS — J918 Pleural effusion in other conditions classified elsewhere: Secondary | ICD-10-CM | POA: Diagnosis present

## 2021-06-23 DIAGNOSIS — R748 Abnormal levels of other serum enzymes: Secondary | ICD-10-CM

## 2021-06-23 DIAGNOSIS — D62 Acute posthemorrhagic anemia: Secondary | ICD-10-CM | POA: Diagnosis not present

## 2021-06-23 DIAGNOSIS — N179 Acute kidney failure, unspecified: Principal | ICD-10-CM

## 2021-06-23 DIAGNOSIS — J984 Other disorders of lung: Secondary | ICD-10-CM | POA: Diagnosis present

## 2021-06-23 DIAGNOSIS — F1010 Alcohol abuse, uncomplicated: Secondary | ICD-10-CM | POA: Diagnosis present

## 2021-06-23 DIAGNOSIS — G9341 Metabolic encephalopathy: Secondary | ICD-10-CM | POA: Diagnosis present

## 2021-06-23 DIAGNOSIS — E538 Deficiency of other specified B group vitamins: Secondary | ICD-10-CM | POA: Diagnosis present

## 2021-06-23 DIAGNOSIS — E1159 Type 2 diabetes mellitus with other circulatory complications: Secondary | ICD-10-CM | POA: Diagnosis present

## 2021-06-23 DIAGNOSIS — Z9079 Acquired absence of other genital organ(s): Secondary | ICD-10-CM

## 2021-06-23 DIAGNOSIS — K660 Peritoneal adhesions (postprocedural) (postinfection): Secondary | ICD-10-CM | POA: Diagnosis present

## 2021-06-23 DIAGNOSIS — J869 Pyothorax without fistula: Secondary | ICD-10-CM | POA: Diagnosis not present

## 2021-06-23 DIAGNOSIS — J189 Pneumonia, unspecified organism: Secondary | ICD-10-CM

## 2021-06-23 DIAGNOSIS — M4802 Spinal stenosis, cervical region: Secondary | ICD-10-CM | POA: Diagnosis present

## 2021-06-23 DIAGNOSIS — Z8546 Personal history of malignant neoplasm of prostate: Secondary | ICD-10-CM

## 2021-06-23 DIAGNOSIS — M436 Torticollis: Secondary | ICD-10-CM | POA: Diagnosis present

## 2021-06-23 DIAGNOSIS — N281 Cyst of kidney, acquired: Secondary | ICD-10-CM | POA: Diagnosis not present

## 2021-06-23 DIAGNOSIS — E440 Moderate protein-calorie malnutrition: Secondary | ICD-10-CM | POA: Diagnosis present

## 2021-06-23 DIAGNOSIS — W3400XS Accidental discharge from unspecified firearms or gun, sequela: Secondary | ICD-10-CM

## 2021-06-23 DIAGNOSIS — R404 Transient alteration of awareness: Secondary | ICD-10-CM | POA: Diagnosis not present

## 2021-06-23 DIAGNOSIS — Z89612 Acquired absence of left leg above knee: Secondary | ICD-10-CM

## 2021-06-23 LAB — LACTIC ACID, PLASMA
Lactic Acid, Venous: 2.7 mmol/L (ref 0.5–1.9)
Lactic Acid, Venous: 1.7 mmol/L (ref 0.5–1.9)

## 2021-06-23 LAB — URINALYSIS, MICROSCOPIC (REFLEX)

## 2021-06-23 LAB — CBC WITH DIFFERENTIAL/PLATELET
Abs Immature Granulocytes: 0.15 10*3/uL — ABNORMAL HIGH (ref 0.00–0.07)
Basophils Absolute: 0 10*3/uL (ref 0.0–0.1)
Basophils Relative: 0 %
Eosinophils Absolute: 0 10*3/uL (ref 0.0–0.5)
Eosinophils Relative: 0 %
HCT: 43 % (ref 39.0–52.0)
Hemoglobin: 14.2 g/dL (ref 13.0–17.0)
Immature Granulocytes: 1 %
Lymphocytes Relative: 4 %
Lymphs Abs: 0.6 10*3/uL — ABNORMAL LOW (ref 0.7–4.0)
MCH: 25.8 pg — ABNORMAL LOW (ref 26.0–34.0)
MCHC: 33 g/dL (ref 30.0–36.0)
MCV: 78.2 fL — ABNORMAL LOW (ref 80.0–100.0)
Monocytes Absolute: 1.3 10*3/uL — ABNORMAL HIGH (ref 0.1–1.0)
Monocytes Relative: 8 %
Neutro Abs: 13.7 10*3/uL — ABNORMAL HIGH (ref 1.7–7.7)
Neutrophils Relative %: 87 %
Platelets: 255 10*3/uL (ref 150–400)
RBC: 5.5 MIL/uL (ref 4.22–5.81)
RDW: 21.3 % — ABNORMAL HIGH (ref 11.5–15.5)
WBC: 15.8 10*3/uL — ABNORMAL HIGH (ref 4.0–10.5)
nRBC: 0 % (ref 0.0–0.2)

## 2021-06-23 LAB — I-STAT VENOUS BLOOD GAS, ED
Acid-Base Excess: 0 mmol/L (ref 0.0–2.0)
Bicarbonate: 24.5 mmol/L (ref 20.0–28.0)
Calcium, Ion: 1.19 mmol/L (ref 1.15–1.40)
HCT: 44 % (ref 39.0–52.0)
Hemoglobin: 15 g/dL (ref 13.0–17.0)
O2 Saturation: 32 %
Potassium: 4.3 mmol/L (ref 3.5–5.1)
Sodium: 144 mmol/L (ref 135–145)
TCO2: 26 mmol/L (ref 22–32)
pCO2, Ven: 38 mmHg — ABNORMAL LOW (ref 44.0–60.0)
pH, Ven: 7.417 (ref 7.250–7.430)
pO2, Ven: 20 mmHg — CL (ref 32.0–45.0)

## 2021-06-23 LAB — URINALYSIS, ROUTINE W REFLEX MICROSCOPIC
Glucose, UA: NEGATIVE mg/dL
Ketones, ur: NEGATIVE mg/dL
Leukocytes,Ua: NEGATIVE
Nitrite: NEGATIVE
Protein, ur: NEGATIVE mg/dL
Specific Gravity, Urine: 1.02 (ref 1.005–1.030)
pH: 5.5 (ref 5.0–8.0)

## 2021-06-23 LAB — RESP PANEL BY RT-PCR (FLU A&B, COVID) ARPGX2
Influenza A by PCR: NEGATIVE
Influenza B by PCR: NEGATIVE
SARS Coronavirus 2 by RT PCR: NEGATIVE

## 2021-06-23 LAB — PROTIME-INR
INR: 1.1 (ref 0.8–1.2)
Prothrombin Time: 14.3 seconds (ref 11.4–15.2)

## 2021-06-23 LAB — COMPREHENSIVE METABOLIC PANEL
ALT: 31 U/L (ref 0–44)
AST: 46 U/L — ABNORMAL HIGH (ref 15–41)
Albumin: 4.2 g/dL (ref 3.5–5.0)
Alkaline Phosphatase: 55 U/L (ref 38–126)
Anion gap: 18 — ABNORMAL HIGH (ref 5–15)
BUN: 55 mg/dL — ABNORMAL HIGH (ref 6–20)
CO2: 20 mmol/L — ABNORMAL LOW (ref 22–32)
Calcium: 10.7 mg/dL — ABNORMAL HIGH (ref 8.9–10.3)
Chloride: 105 mmol/L (ref 98–111)
Creatinine, Ser: 2.73 mg/dL — ABNORMAL HIGH (ref 0.61–1.24)
GFR, Estimated: 26 mL/min — ABNORMAL LOW (ref >60)
Glucose, Bld: 132 mg/dL — ABNORMAL HIGH (ref 70–99)
Potassium: 5.7 mmol/L — ABNORMAL HIGH (ref 3.5–5.1)
Sodium: 143 mmol/L (ref 135–145)
Total Bilirubin: 1.4 mg/dL — ABNORMAL HIGH (ref 0.3–1.2)
Total Protein: 8.6 g/dL — ABNORMAL HIGH (ref 6.5–8.1)

## 2021-06-23 LAB — TROPONIN I (HIGH SENSITIVITY): Troponin I (High Sensitivity): 23 ng/L — ABNORMAL HIGH (ref <18)

## 2021-06-23 LAB — RAPID URINE DRUG SCREEN, HOSP PERFORMED
Amphetamines: NOT DETECTED
Barbiturates: NOT DETECTED
Benzodiazepines: NOT DETECTED
Cocaine: NOT DETECTED
Opiates: NOT DETECTED
Tetrahydrocannabinol: NOT DETECTED

## 2021-06-23 LAB — CK: Total CK: 1178 U/L — ABNORMAL HIGH (ref 49–397)

## 2021-06-23 LAB — CBG MONITORING, ED: Glucose-Capillary: 129 mg/dL — ABNORMAL HIGH (ref 70–99)

## 2021-06-23 LAB — AMMONIA: Ammonia: 10 umol/L (ref 9–35)

## 2021-06-23 MED ORDER — SODIUM CHLORIDE 0.9 % IV BOLUS
500.0000 mL | Freq: Once | INTRAVENOUS | Status: AC
  Administered 2021-06-23: 500 mL via INTRAVENOUS

## 2021-06-23 MED ORDER — SODIUM CHLORIDE 0.9% FLUSH
3.0000 mL | Freq: Two times a day (BID) | INTRAVENOUS | Status: DC
  Administered 2021-06-24 – 2021-06-30 (×14): 3 mL via INTRAVENOUS

## 2021-06-23 MED ORDER — DEXTROSE 50 % IV SOLN
1.0000 | Freq: Once | INTRAVENOUS | Status: AC
  Administered 2021-06-24: 50 mL via INTRAVENOUS
  Filled 2021-06-23: qty 50

## 2021-06-23 MED ORDER — ONDANSETRON HCL 4 MG PO TABS
4.0000 mg | ORAL_TABLET | Freq: Four times a day (QID) | ORAL | Status: DC | PRN
Start: 2021-06-23 — End: 2021-06-29

## 2021-06-23 MED ORDER — THIAMINE HCL 100 MG/ML IJ SOLN
500.0000 mg | Freq: Three times a day (TID) | INTRAVENOUS | Status: AC
  Administered 2021-06-24 – 2021-06-25 (×6): 500 mg via INTRAVENOUS
  Filled 2021-06-23 (×7): qty 5

## 2021-06-23 MED ORDER — THIAMINE HCL 100 MG/ML IJ SOLN
100.0000 mg | Freq: Once | INTRAMUSCULAR | Status: AC
  Administered 2021-06-23: 100 mg via INTRAVENOUS
  Filled 2021-06-23: qty 2

## 2021-06-23 MED ORDER — INSULIN ASPART 100 UNIT/ML IV SOLN
5.0000 [IU] | Freq: Once | INTRAVENOUS | Status: AC
  Administered 2021-06-24: 5 [IU] via INTRAVENOUS

## 2021-06-23 MED ORDER — LACTATED RINGERS IV SOLN: INTRAVENOUS | Status: AC

## 2021-06-23 MED ORDER — ONDANSETRON HCL 4 MG/2ML IJ SOLN: 4.0000 mg | Freq: Four times a day (QID) | INTRAMUSCULAR | Status: DC | PRN

## 2021-06-23 MED ORDER — LORAZEPAM 1 MG PO TABS: 1.0000 mg | ORAL_TABLET | ORAL | Status: AC | PRN

## 2021-06-23 MED ORDER — ADULT MULTIVITAMIN W/MINERALS CH
1.0000 | ORAL_TABLET | Freq: Every day | ORAL | Status: DC
  Administered 2021-06-24 – 2021-07-09 (×13): 1 via ORAL
  Filled 2021-06-23 (×15): qty 1

## 2021-06-23 MED ORDER — LORAZEPAM 2 MG/ML IJ SOLN: 1.0000 mg | INTRAMUSCULAR | Status: AC | PRN

## 2021-06-23 MED ORDER — ACETAMINOPHEN 650 MG RE SUPP: 650.0000 mg | Freq: Four times a day (QID) | RECTAL | Status: DC | PRN

## 2021-06-23 MED ORDER — HEPARIN SODIUM (PORCINE) 5000 UNIT/ML IJ SOLN
5000.0000 [IU] | Freq: Three times a day (TID) | INTRAMUSCULAR | Status: DC
Start: 2021-06-23 — End: 2021-07-01
  Administered 2021-06-24 – 2021-07-01 (×22): 5000 [IU] via SUBCUTANEOUS
  Filled 2021-06-23 (×23): qty 1

## 2021-06-23 MED ORDER — ACETAMINOPHEN 325 MG PO TABS
650.0000 mg | ORAL_TABLET | Freq: Four times a day (QID) | ORAL | Status: DC | PRN
  Administered 2021-06-26 – 2021-07-04 (×4): 650 mg via ORAL
  Filled 2021-06-23 (×4): qty 2

## 2021-06-23 MED ORDER — FOLIC ACID 1 MG PO TABS
1.0000 mg | ORAL_TABLET | Freq: Every day | ORAL | Status: DC
  Administered 2021-06-24 – 2021-07-09 (×13): 1 mg via ORAL
  Filled 2021-06-23 (×15): qty 1

## 2021-06-23 MED ORDER — THIAMINE HCL 100 MG/ML IJ SOLN
250.0000 mg | INTRAVENOUS | Status: AC
  Administered 2021-06-25 – 2021-06-29 (×5): 250 mg via INTRAVENOUS
  Filled 2021-06-23 (×5): qty 2.5

## 2021-06-23 NOTE — ED Notes (Signed)
Daughter at bedside.  Asking for update from Admitting.  They have been contacted.
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
## 2021-06-23 NOTE — ED Triage Notes (Signed)
PT BIB [facility] d/t reports by family that he had not been seen in about 5 days.  Brother went by and saw him sitting in chair.UPon knocking on door, pt crawled to door to open it.  Pt normally ambulates independently but does have a prosthetic on left lower leg.  EMS reports no obvious physical deficits but reports he is oriented to self.  Needs prompting to respond.  EMS reported some leaning to right side and a complaint of neck stiffness.

## 2021-06-23 NOTE — ED Notes (Signed)
Condom cath placed on patient

## 2021-06-24 ENCOUNTER — Observation Stay (HOSPITAL_COMMUNITY): Payer: Medicare HMO

## 2021-06-24 ENCOUNTER — Other Ambulatory Visit: Payer: Self-pay

## 2021-06-24 DIAGNOSIS — J9809 Other diseases of bronchus, not elsewhere classified: Secondary | ICD-10-CM | POA: Diagnosis not present

## 2021-06-24 DIAGNOSIS — M47812 Spondylosis without myelopathy or radiculopathy, cervical region: Secondary | ICD-10-CM | POA: Diagnosis not present

## 2021-06-24 DIAGNOSIS — F0390 Unspecified dementia without behavioral disturbance: Secondary | ICD-10-CM | POA: Diagnosis not present

## 2021-06-24 DIAGNOSIS — E440 Moderate protein-calorie malnutrition: Secondary | ICD-10-CM | POA: Diagnosis not present

## 2021-06-24 DIAGNOSIS — I152 Hypertension secondary to endocrine disorders: Secondary | ICD-10-CM

## 2021-06-24 DIAGNOSIS — D72829 Elevated white blood cell count, unspecified: Secondary | ICD-10-CM | POA: Diagnosis not present

## 2021-06-24 DIAGNOSIS — J9811 Atelectasis: Secondary | ICD-10-CM | POA: Diagnosis not present

## 2021-06-24 DIAGNOSIS — D62 Acute posthemorrhagic anemia: Secondary | ICD-10-CM | POA: Diagnosis not present

## 2021-06-24 DIAGNOSIS — R748 Abnormal levels of other serum enzymes: Secondary | ICD-10-CM

## 2021-06-24 DIAGNOSIS — G9341 Metabolic encephalopathy: Secondary | ICD-10-CM

## 2021-06-24 DIAGNOSIS — R1010 Upper abdominal pain, unspecified: Secondary | ICD-10-CM | POA: Diagnosis not present

## 2021-06-24 DIAGNOSIS — R6510 Systemic inflammatory response syndrome (SIRS) of non-infectious origin without acute organ dysfunction: Secondary | ICD-10-CM | POA: Diagnosis not present

## 2021-06-24 DIAGNOSIS — N19 Unspecified kidney failure: Secondary | ICD-10-CM | POA: Diagnosis not present

## 2021-06-24 DIAGNOSIS — E1169 Type 2 diabetes mellitus with other specified complication: Secondary | ICD-10-CM | POA: Diagnosis not present

## 2021-06-24 DIAGNOSIS — M542 Cervicalgia: Secondary | ICD-10-CM | POA: Diagnosis not present

## 2021-06-24 DIAGNOSIS — R4182 Altered mental status, unspecified: Secondary | ICD-10-CM | POA: Diagnosis not present

## 2021-06-24 DIAGNOSIS — G9340 Encephalopathy, unspecified: Secondary | ICD-10-CM | POA: Diagnosis not present

## 2021-06-24 DIAGNOSIS — R109 Unspecified abdominal pain: Secondary | ICD-10-CM | POA: Diagnosis not present

## 2021-06-24 DIAGNOSIS — M2578 Osteophyte, vertebrae: Secondary | ICD-10-CM | POA: Diagnosis not present

## 2021-06-24 DIAGNOSIS — J690 Pneumonitis due to inhalation of food and vomit: Secondary | ICD-10-CM | POA: Diagnosis not present

## 2021-06-24 DIAGNOSIS — E1159 Type 2 diabetes mellitus with other circulatory complications: Secondary | ICD-10-CM | POA: Diagnosis not present

## 2021-06-24 DIAGNOSIS — R0602 Shortness of breath: Secondary | ICD-10-CM | POA: Diagnosis not present

## 2021-06-24 DIAGNOSIS — M4802 Spinal stenosis, cervical region: Secondary | ICD-10-CM | POA: Diagnosis not present

## 2021-06-24 DIAGNOSIS — E875 Hyperkalemia: Secondary | ICD-10-CM | POA: Diagnosis not present

## 2021-06-24 DIAGNOSIS — I517 Cardiomegaly: Secondary | ICD-10-CM | POA: Diagnosis not present

## 2021-06-24 DIAGNOSIS — N179 Acute kidney failure, unspecified: Secondary | ICD-10-CM | POA: Diagnosis not present

## 2021-06-24 DIAGNOSIS — T17590A Other foreign object in bronchus causing asphyxiation, initial encounter: Secondary | ICD-10-CM | POA: Diagnosis not present

## 2021-06-24 DIAGNOSIS — S199XXA Unspecified injury of neck, initial encounter: Secondary | ICD-10-CM | POA: Diagnosis not present

## 2021-06-24 DIAGNOSIS — R918 Other nonspecific abnormal finding of lung field: Secondary | ICD-10-CM | POA: Diagnosis not present

## 2021-06-24 DIAGNOSIS — N170 Acute kidney failure with tubular necrosis: Secondary | ICD-10-CM | POA: Diagnosis not present

## 2021-06-24 DIAGNOSIS — J439 Emphysema, unspecified: Secondary | ICD-10-CM | POA: Diagnosis not present

## 2021-06-24 DIAGNOSIS — J189 Pneumonia, unspecified organism: Secondary | ICD-10-CM | POA: Diagnosis not present

## 2021-06-24 DIAGNOSIS — J918 Pleural effusion in other conditions classified elsewhere: Secondary | ICD-10-CM | POA: Diagnosis not present

## 2021-06-24 DIAGNOSIS — J869 Pyothorax without fistula: Secondary | ICD-10-CM | POA: Diagnosis not present

## 2021-06-24 DIAGNOSIS — F05 Delirium due to known physiological condition: Secondary | ICD-10-CM | POA: Diagnosis not present

## 2021-06-24 DIAGNOSIS — E872 Acidosis, unspecified: Secondary | ICD-10-CM | POA: Diagnosis not present

## 2021-06-24 DIAGNOSIS — F1010 Alcohol abuse, uncomplicated: Secondary | ICD-10-CM | POA: Diagnosis not present

## 2021-06-24 DIAGNOSIS — J851 Abscess of lung with pneumonia: Secondary | ICD-10-CM | POA: Diagnosis not present

## 2021-06-24 DIAGNOSIS — E119 Type 2 diabetes mellitus without complications: Secondary | ICD-10-CM | POA: Diagnosis not present

## 2021-06-24 DIAGNOSIS — R091 Pleurisy: Secondary | ICD-10-CM | POA: Diagnosis not present

## 2021-06-24 DIAGNOSIS — E512 Wernicke's encephalopathy: Secondary | ICD-10-CM | POA: Diagnosis not present

## 2021-06-24 DIAGNOSIS — K760 Fatty (change of) liver, not elsewhere classified: Secondary | ICD-10-CM | POA: Diagnosis not present

## 2021-06-24 DIAGNOSIS — Z4682 Encounter for fitting and adjustment of non-vascular catheter: Secondary | ICD-10-CM | POA: Diagnosis not present

## 2021-06-24 DIAGNOSIS — K567 Ileus, unspecified: Secondary | ICD-10-CM | POA: Diagnosis not present

## 2021-06-24 DIAGNOSIS — R1084 Generalized abdominal pain: Secondary | ICD-10-CM | POA: Diagnosis not present

## 2021-06-24 DIAGNOSIS — K222 Esophageal obstruction: Secondary | ICD-10-CM | POA: Diagnosis present

## 2021-06-24 DIAGNOSIS — M160 Bilateral primary osteoarthritis of hip: Secondary | ICD-10-CM | POA: Diagnosis not present

## 2021-06-24 DIAGNOSIS — Z20822 Contact with and (suspected) exposure to covid-19: Secondary | ICD-10-CM | POA: Diagnosis not present

## 2021-06-24 DIAGNOSIS — I10 Essential (primary) hypertension: Secondary | ICD-10-CM | POA: Diagnosis not present

## 2021-06-24 DIAGNOSIS — R140 Abdominal distension (gaseous): Secondary | ICD-10-CM | POA: Diagnosis not present

## 2021-06-24 DIAGNOSIS — K6389 Other specified diseases of intestine: Secondary | ICD-10-CM | POA: Diagnosis not present

## 2021-06-24 DIAGNOSIS — M6282 Rhabdomyolysis: Secondary | ICD-10-CM | POA: Diagnosis not present

## 2021-06-24 DIAGNOSIS — R7401 Elevation of levels of liver transaminase levels: Secondary | ICD-10-CM | POA: Diagnosis not present

## 2021-06-24 DIAGNOSIS — J90 Pleural effusion, not elsewhere classified: Secondary | ICD-10-CM | POA: Diagnosis not present

## 2021-06-24 DIAGNOSIS — K824 Cholesterolosis of gallbladder: Secondary | ICD-10-CM | POA: Diagnosis not present

## 2021-06-24 DIAGNOSIS — W3400XS Accidental discharge from unspecified firearms or gun, sequela: Secondary | ICD-10-CM | POA: Diagnosis not present

## 2021-06-24 LAB — TSH: TSH: 1.857 u[IU]/mL (ref 0.350–4.500)

## 2021-06-24 LAB — COMPREHENSIVE METABOLIC PANEL WITH GFR
ALT: 21 U/L (ref 0–44)
AST: 37 U/L (ref 15–41)
Albumin: 3.5 g/dL (ref 3.5–5.0)
Alkaline Phosphatase: 50 U/L (ref 38–126)
Anion gap: 16 — ABNORMAL HIGH (ref 5–15)
BUN: 47 mg/dL — ABNORMAL HIGH (ref 6–20)
CO2: 20 mmol/L — ABNORMAL LOW (ref 22–32)
Calcium: 9.6 mg/dL (ref 8.9–10.3)
Chloride: 108 mmol/L (ref 98–111)
Creatinine, Ser: 1.7 mg/dL — ABNORMAL HIGH (ref 0.61–1.24)
GFR, Estimated: 46 mL/min — ABNORMAL LOW
Glucose, Bld: 119 mg/dL — ABNORMAL HIGH (ref 70–99)
Potassium: 3.8 mmol/L (ref 3.5–5.1)
Sodium: 144 mmol/L (ref 135–145)
Total Bilirubin: 1.4 mg/dL — ABNORMAL HIGH (ref 0.3–1.2)
Total Protein: 7.8 g/dL (ref 6.5–8.1)

## 2021-06-24 LAB — CBC
HCT: 40.2 % (ref 39.0–52.0)
Hemoglobin: 13.1 g/dL (ref 13.0–17.0)
MCH: 26.1 pg (ref 26.0–34.0)
MCHC: 32.6 g/dL (ref 30.0–36.0)
MCV: 80.2 fL (ref 80.0–100.0)
Platelets: 196 K/uL (ref 150–400)
RBC: 5.01 MIL/uL (ref 4.22–5.81)
RDW: 21.4 % — ABNORMAL HIGH (ref 11.5–15.5)
WBC: 14.3 K/uL — ABNORMAL HIGH (ref 4.0–10.5)
nRBC: 0.2 % (ref 0.0–0.2)

## 2021-06-24 LAB — MAGNESIUM: Magnesium: 2.5 mg/dL — ABNORMAL HIGH (ref 1.7–2.4)

## 2021-06-24 LAB — CK: Total CK: 1133 U/L — ABNORMAL HIGH (ref 49–397)

## 2021-06-24 LAB — GLUCOSE, CAPILLARY: Glucose-Capillary: 104 mg/dL — ABNORMAL HIGH (ref 70–99)

## 2021-06-24 LAB — CBG MONITORING, ED
Glucose-Capillary: 114 mg/dL — ABNORMAL HIGH (ref 70–99)
Glucose-Capillary: 115 mg/dL — ABNORMAL HIGH (ref 70–99)

## 2021-06-24 LAB — ETHANOL: Alcohol, Ethyl (B): <10 mg/dL (ref <10)

## 2021-06-24 LAB — PHOSPHORUS: Phosphorus: 3.9 mg/dL (ref 2.5–4.6)

## 2021-06-24 LAB — VITAMIN B12: Vitamin B-12: 323 pg/mL (ref 180–914)

## 2021-06-24 LAB — HIV ANTIBODY (ROUTINE TESTING W REFLEX): HIV Screen 4th Generation wRfx: NONREACTIVE

## 2021-06-24 LAB — ACETAMINOPHEN LEVEL: Acetaminophen (Tylenol), Serum: <10 ug/mL — ABNORMAL LOW (ref 10–30)

## 2021-06-24 LAB — RPR: RPR Ser Ql: NONREACTIVE

## 2021-06-24 LAB — SODIUM, URINE, RANDOM: Sodium, Ur: 18 mmol/L

## 2021-06-24 LAB — TROPONIN I (HIGH SENSITIVITY): Troponin I (High Sensitivity): 20 ng/L — ABNORMAL HIGH (ref <18)

## 2021-06-24 LAB — SALICYLATE LEVEL: Salicylate Lvl: <7 mg/dL — ABNORMAL LOW (ref 7.0–30.0)

## 2021-06-24 LAB — CREATININE, URINE, RANDOM: Creatinine, Urine: 188.98 mg/dL

## 2021-06-24 MED ORDER — LACTATED RINGERS IV SOLN: INTRAVENOUS | Status: AC

## 2021-06-24 MED ORDER — VITAMIN B-12 1000 MCG PO TABS
1000.0000 ug | ORAL_TABLET | Freq: Every day | ORAL | Status: DC
  Administered 2021-06-24 – 2021-07-17 (×20): 1000 ug via ORAL
  Filled 2021-06-24 (×23): qty 1

## 2021-06-24 MED ORDER — INSULIN ASPART 100 UNIT/ML IJ SOLN
0.0000 [IU] | Freq: Three times a day (TID) | INTRAMUSCULAR | Status: DC
  Administered 2021-06-25 – 2021-06-26 (×2): 1 [IU] via SUBCUTANEOUS
  Administered 2021-06-27: 2 [IU] via SUBCUTANEOUS
  Administered 2021-06-27 – 2021-07-08 (×8): 1 [IU] via SUBCUTANEOUS
  Administered 2021-07-10: 2 [IU] via SUBCUTANEOUS

## 2021-06-24 NOTE — Plan of Care (Signed)
Brief plan of care  MRI cervical did not show acute pathology and showed mild-to-moderate right-sided foraminal stenosis at C3-4 and C6-7. Mild left foraminal stenosis at C3-4. No canal stenosis at any level.   MRI brain showed classic findings seen in Wernicke's encephalopathy.  Neurology will remain available for questions   Lynnae Sandhoff, MD Stroke Neurology Page: [phone]
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
## 2021-06-24 NOTE — ED Notes (Signed)
Hospitalist at bedside assessing pt at this time.  

## 2021-06-24 NOTE — ED Notes (Signed)
Pt transported to MRI via stretcher at this time.  °

## 2021-06-24 NOTE — ED Notes (Signed)
Pt daughter to desk to request cup of ice, informed pt is NPO, verbalized understanding and reports ice is for her; cup of ice given and daughter reminded pt is NPO

## 2021-06-25 DIAGNOSIS — I152 Hypertension secondary to endocrine disorders: Secondary | ICD-10-CM | POA: Diagnosis not present

## 2021-06-25 DIAGNOSIS — E1159 Type 2 diabetes mellitus with other circulatory complications: Secondary | ICD-10-CM | POA: Diagnosis not present

## 2021-06-25 DIAGNOSIS — E1169 Type 2 diabetes mellitus with other specified complication: Secondary | ICD-10-CM

## 2021-06-25 DIAGNOSIS — N179 Acute kidney failure, unspecified: Secondary | ICD-10-CM | POA: Diagnosis not present

## 2021-06-25 LAB — CK: Total CK: 817 U/L — ABNORMAL HIGH (ref 49–397)

## 2021-06-25 LAB — CBC
HCT: 33.2 % — ABNORMAL LOW (ref 39.0–52.0)
Hemoglobin: 11.2 g/dL — ABNORMAL LOW (ref 13.0–17.0)
MCH: 26.3 pg (ref 26.0–34.0)
MCHC: 33.7 g/dL (ref 30.0–36.0)
MCV: 77.9 fL — ABNORMAL LOW (ref 80.0–100.0)
Platelets: 216 10*3/uL (ref 150–400)
RBC: 4.26 MIL/uL (ref 4.22–5.81)
RDW: 20.7 % — ABNORMAL HIGH (ref 11.5–15.5)
WBC: 16.2 10*3/uL — ABNORMAL HIGH (ref 4.0–10.5)
nRBC: 0 % (ref 0.0–0.2)

## 2021-06-25 LAB — HEMOGLOBIN A1C
Hgb A1c MFr Bld: 6 % — ABNORMAL HIGH (ref 4.8–5.6)
Hgb A1c MFr Bld: 6 % — ABNORMAL HIGH (ref 4.8–5.6)
Mean Plasma Glucose: 126 mg/dL
Mean Plasma Glucose: 125.5 mg/dL

## 2021-06-25 LAB — BASIC METABOLIC PANEL
Anion gap: 10 (ref 5–15)
BUN: 22 mg/dL — ABNORMAL HIGH (ref 6–20)
CO2: 24 mmol/L (ref 22–32)
Calcium: 9.1 mg/dL (ref 8.9–10.3)
Chloride: 108 mmol/L (ref 98–111)
Creatinine, Ser: 1.06 mg/dL (ref 0.61–1.24)
GFR, Estimated: >60 mL/min (ref >60)
Glucose, Bld: 100 mg/dL — ABNORMAL HIGH (ref 70–99)
Potassium: 3.8 mmol/L (ref 3.5–5.1)
Sodium: 142 mmol/L (ref 135–145)

## 2021-06-25 LAB — HEPATITIS PANEL, ACUTE
HCV Ab: NONREACTIVE
Hep A IgM: NONREACTIVE
Hep B C IgM: NONREACTIVE
Hepatitis B Surface Ag: NONREACTIVE

## 2021-06-25 LAB — GLUCOSE, CAPILLARY
Glucose-Capillary: 90 mg/dL (ref 70–99)
Glucose-Capillary: 132 mg/dL — ABNORMAL HIGH (ref 70–99)
Glucose-Capillary: 112 mg/dL — ABNORMAL HIGH (ref 70–99)
Glucose-Capillary: 102 mg/dL — ABNORMAL HIGH (ref 70–99)

## 2021-06-25 LAB — RAPID HIV SCREEN (HIV 1/2 AB+AG)
HIV 1/2 Antibodies: NONREACTIVE
HIV-1 P24 Antigen - HIV24: NONREACTIVE

## 2021-06-25 LAB — PHOSPHORUS: Phosphorus: 2.8 mg/dL (ref 2.5–4.6)

## 2021-06-25 LAB — MAGNESIUM: Magnesium: 2.2 mg/dL (ref 1.7–2.4)

## 2021-06-25 MED ORDER — PANTOPRAZOLE SODIUM 40 MG PO TBEC
40.0000 mg | DELAYED_RELEASE_TABLET | Freq: Every day | ORAL | Status: DC
  Administered 2021-06-25 – 2021-07-09 (×11): 40 mg via ORAL
  Filled 2021-06-25 (×12): qty 1

## 2021-06-25 MED ORDER — ALUM & MAG HYDROXIDE-SIMETH 200-200-20 MG/5ML PO SUSP
30.0000 mL | Freq: Four times a day (QID) | ORAL | Status: DC | PRN
  Administered 2021-06-25 – 2021-06-28 (×4): 30 mL via ORAL
  Filled 2021-06-25 (×4): qty 30
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
## 2021-06-25 NOTE — Plan of Care (Signed)
Pt alert to self during am shift, family at bedside during am shift, pt up to chair with assist, ciwa every 6 hours as ordered, pt drowsy, achs with coverage, epigastric discomfort, prn and ordered medications provided, right aka, call bell within reach, bed in locked and lowered position, sbar to be provided to on coming nurse.    Problem: Health Behavior/Discharge Planning: Goal: Ability to manage health-related needs will improve Outcome: Progressing   Problem: Clinical Measurements: Goal: Ability to maintain clinical measurements within normal limits will improve Outcome: Progressing Goal: Will remain free from infection Outcome: Progressing Goal: Diagnostic test results will improve Outcome: Progressing Goal: Respiratory complications will improve Outcome: Progressing   Problem: Nutrition: Goal: Adequate nutrition will be maintained Outcome: Progressing   Problem: Elimination: Goal: Will not experience complications related to bowel motility Outcome: Progressing   Problem: Pain Managment: Goal: General experience of comfort will improve Outcome: Progressing
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
## 2021-06-26 DIAGNOSIS — I152 Hypertension secondary to endocrine disorders: Secondary | ICD-10-CM | POA: Diagnosis not present

## 2021-06-26 DIAGNOSIS — N179 Acute kidney failure, unspecified: Secondary | ICD-10-CM | POA: Diagnosis not present

## 2021-06-26 DIAGNOSIS — E1159 Type 2 diabetes mellitus with other circulatory complications: Secondary | ICD-10-CM | POA: Diagnosis not present

## 2021-06-26 DIAGNOSIS — E1169 Type 2 diabetes mellitus with other specified complication: Secondary | ICD-10-CM | POA: Diagnosis not present

## 2021-06-26 LAB — GLUCOSE, CAPILLARY
Glucose-Capillary: 99 mg/dL (ref 70–99)
Glucose-Capillary: 111 mg/dL — ABNORMAL HIGH (ref 70–99)
Glucose-Capillary: 142 mg/dL — ABNORMAL HIGH (ref 70–99)
Glucose-Capillary: 153 mg/dL — ABNORMAL HIGH (ref 70–99)

## 2021-06-26 LAB — METHYLMALONIC ACID, SERUM: Methylmalonic Acid, Quantitative: 147 nmol/L (ref 0–378)

## 2021-06-26 NOTE — TOC Initial Note (Addendum)
Transition of Care [facility]) - Initial/Assessment Note    Patient Details  Name: Evan Chambers MRN: [id number] Date of Birth: [birth year]  Transition of Care [facility]) CM/SW Contact:    Marae Cottrell Aileen Fass, [place] Work Phone Number: 06/26/2021, 3:13 PM  Clinical Narrative:                 CSW intern spoke with the patients [name] [phone] stated that she would like for her father to be discharged to a skilled nursing facility so he can build his strength back up. The patients daughter states she does not wish for any long term care referrals at this time because she is planning to move in with her father to care for him. 3:44 PM Provided bed offers to patients daughter with medicare ratings. She has selceted [facility].  Expected Discharge Plan: Skilled Nursing Facility Barriers to Discharge: Active Substance Use - Placement, Insurance Authorization   Patient Goals and CMS Choice     Choice offered to / list presented to :  (Daughter Theatre manager)  Expected Discharge Plan and Services Expected Discharge Plan: [place] In-house Referral: Clinical Social Work Discharge Planning Services: NA Post Acute Care Choice: [place] Living arrangements for the past 2 months: Single Family Home                                      Prior Living Arrangements/Services Living arrangements for the past 2 months: Single Family Home Lives with:: Self Patient language and need for interpreter reviewed:: Yes Do you feel safe going back to the place where you live?: No      Need for Family Participation in Patient Care: Yes (Comment) Care giver support system in place?: Yes (comment)   Criminal Activity/Legal Involvement Pertinent to Current Situation/Hospitalization: No - Comment as needed  Activities of Daily Living      Permission Sought/Granted Permission sought to share information with : Family Supports Permission granted  to share information with : Yes, Verbal Permission Granted  Share Information with NAME: [name] [phone]           Emotional Assessment Appearance:: Developmentally appropriate Attitude/Demeanor/Rapport: Unable to Assess Affect (typically observed): Unable to Assess Orientation: : Oriented to Self, Oriented to Situation Alcohol / Substance Use: Alcohol Use Psych Involvement: No (comment)  Admission diagnosis:  Dehydration [E86.0] Hyperkalemia [E87.5] Confusion [R41.0] Encephalopathy acute [G93.40] Elevated CK [R74.8] Acute renal failure (ARF) (HCC) [N17.9] Acute renal failure, unspecified acute renal failure type ([place]) [N17.9] AMS (altered mental status) [R41.82] Patient Active Problem List   Diagnosis Date Noted   Encephalopathy acute 06/24/2021   Type 2 diabetes mellitus ([place]) 06/23/2021   Hypertension associated with diabetes ([place]) 06/23/2021   Hyperkalemia 54/00/8676   Acute metabolic encephalopathy 19/50/9326   Acute renal failure (ARF) ([place]) 71/24/5809   Metabolic acidosis, increased anion gap 03/16/2019   Dehydration 07/17/2017   Dysphagia 07/17/2017   Hyponatremia 07/17/2017   Hiatal hernia    Lower esophageal ring    Chest pain 11/14/2016   Elevated ETOH level 11/14/2016   Hypokalemia 11/14/2016   Elevated blood sugar level 11/14/2016   Personal history of prostate cancer 11/14/2016   PCP:  Lucianne Lei, MD Pharmacy:   [facility] [place], [place] [address] [place] [address] [place] [address] Phone: [phone] Fax: [phone]     Social  Determinants of Health (SDOH) Interventions    Readmission Risk Interventions No flowsheet data found.
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
## 2021-06-26 NOTE — NC FL2 (Signed)
[place] MEDICAID FL2 LEVEL OF CARE SCREENING TOOL     IDENTIFICATION  Patient Name: Evan Chambers Birthdate: 15-Jun-1961 Sex: male Admission Date (Current Location): 06/23/2021  [facility] and [place] Number:  Herbalist and Address:  [facility] [place], [place] [address], [place], [place] [address]      Provider Number: [id number]  Attending Physician Name and Address:  Jonetta Osgood, MD  Relative Name and Phone Number:       Current Level of Care: Hospital Recommended Level of Care: [place] Prior Approval Number:    Date Approved/Denied:   PASRR Number: [id number]  Discharge Plan: SNF    Current Diagnoses: Patient Active Problem List   Diagnosis Date Noted   Encephalopathy acute 06/24/2021   Type 2 diabetes mellitus ([place]) 06/23/2021   Hypertension associated with diabetes ([place]) 06/23/2021   Hyperkalemia 47/42/5956   Acute metabolic encephalopathy 38/75/6433   Acute renal failure (ARF) ([place]) 29/51/8841   Metabolic acidosis, increased anion gap 03/16/2019   Dehydration 07/17/2017   Dysphagia 07/17/2017   Hyponatremia 07/17/2017   Hiatal hernia    Lower esophageal ring    Chest pain 11/14/2016   Elevated ETOH level 11/14/2016   Hypokalemia 11/14/2016   Elevated blood sugar level 11/14/2016   Personal history of prostate cancer 11/14/2016    Orientation RESPIRATION BLADDER Height & Weight     Self, Time, Situation, Place  Normal Incontinent, External catheter Weight: 130 lb 8.2 oz (59.2 kg) Height:  5\' 7"  (170.2 cm)  BEHAVIORAL SYMPTOMS/MOOD NEUROLOGICAL BOWEL NUTRITION STATUS      Continent Diet (see dc summary)  AMBULATORY STATUS COMMUNICATION OF NEEDS Skin   Limited Assist Verbally Normal                       Personal Care Assistance Level of Assistance  Bathing, Feeding, Dressing Bathing Assistance: Limited assistance Feeding assistance: Limited assistance Dressing Assistance: Limited  assistance     Functional Limitations Info             [place]  PT (By licensed PT), OT (By licensed OT)     PT Frequency: 5x/week OT Frequency: 5x/week            Contractures Contractures Info: Not present    Additional Factors Info  Code Status, Allergies, Insulin Sliding Scale Code Status Info: Full Allergies Info: NKA   Insulin Sliding Scale Info: see dc summary       Current Medications (06/26/2021):  This is the current hospital active medication list Current Facility-Administered Medications  Medication Dose Route Frequency Provider Last Rate Last Admin   acetaminophen (TYLENOL) tablet 650 mg  650 mg Oral Q6H PRN Lenore Cordia, MD   650 mg at 06/26/21 0553   Or   acetaminophen (TYLENOL) suppository 650 mg  650 mg Rectal Q6H PRN Lenore Cordia, MD       alum & mag hydroxide-simeth (MAALOX/MYLANTA) 200-200-20 MG/5ML suspension 30 mL  30 mL Oral Q6H PRN Jonetta Osgood, MD   30 mL at 66/06/30 1601   folic acid (FOLVITE) tablet 1 mg  1 mg Oral Daily Zada Finders R, MD   1 mg at 06/25/21 0910   heparin injection 5,000 Units  5,000 Units Subcutaneous Q8H Zada Finders R, MD   5,000 Units at 06/26/21 0542   insulin aspart (novoLOG) injection 0-9 Units  0-9 Units Subcutaneous TID WC Ghimire, Henreitta Leber, MD  1 Units at 06/25/21 1331   LORazepam (ATIVAN) tablet 1-4 mg  1-4 mg Oral Q1H PRN Lenore Cordia, MD       Or   LORazepam (ATIVAN) injection 1-4 mg  1-4 mg Intravenous Q1H PRN Lenore Cordia, MD       multivitamin with minerals tablet 1 tablet  1 tablet Oral Daily Zada Finders R, MD   1 tablet at 06/25/21 0910   ondansetron (ZOFRAN) tablet 4 mg  4 mg Oral Q6H PRN Lenore Cordia, MD       Or   ondansetron (ZOFRAN) injection 4 mg  4 mg Intravenous Q6H PRN Lenore Cordia, MD       pantoprazole (PROTONIX) EC tablet 40 mg  40 mg Oral Q1200 Jonetta Osgood, MD   40 mg at 06/25/21 1556   sodium chloride flush (NS) 0.9 % injection 3 mL   3 mL Intravenous Q12H Zada Finders R, MD   3 mL at 06/25/21 2118   thiamine (B-1) 250 mg in sodium chloride 0.9 % 50 mL IVPB  250 mg Intravenous Q24H Zada Finders R, MD 100 mL/hr at 06/25/21 2118 250 mg at 06/25/21 2118   vitamin B-12 (CYANOCOBALAMIN) tablet 1,000 mcg  1,000 mcg Oral Daily Jonetta Osgood, MD   1,000 mcg at 06/25/21 3570     Discharge Medications: Please see discharge summary for a list of discharge medications.  Relevant Imaging Results:  Relevant Lab Results:   Additional Information SSn: [id number]. Pf[employer] 11/16/19,12/11/19, 08/22/20, 05/28/21  Benard Halsted, LCSW

## 2021-06-27 ENCOUNTER — Inpatient Hospital Stay (HOSPITAL_COMMUNITY): Payer: Medicare HMO

## 2021-06-27 DIAGNOSIS — I152 Hypertension secondary to endocrine disorders: Secondary | ICD-10-CM | POA: Diagnosis not present

## 2021-06-27 DIAGNOSIS — E1169 Type 2 diabetes mellitus with other specified complication: Secondary | ICD-10-CM | POA: Diagnosis not present

## 2021-06-27 DIAGNOSIS — N179 Acute kidney failure, unspecified: Secondary | ICD-10-CM | POA: Diagnosis not present

## 2021-06-27 DIAGNOSIS — E1159 Type 2 diabetes mellitus with other circulatory complications: Secondary | ICD-10-CM | POA: Diagnosis not present

## 2021-06-27 LAB — CBC
HCT: 28.5 % — ABNORMAL LOW (ref 39.0–52.0)
Hemoglobin: 9.9 g/dL — ABNORMAL LOW (ref 13.0–17.0)
MCH: 26.3 pg (ref 26.0–34.0)
MCHC: 34.7 g/dL (ref 30.0–36.0)
MCV: 75.6 fL — ABNORMAL LOW (ref 80.0–100.0)
Platelets: 236 10*3/uL (ref 150–400)
RBC: 3.77 MIL/uL — ABNORMAL LOW (ref 4.22–5.81)
RDW: 20.5 % — ABNORMAL HIGH (ref 11.5–15.5)
WBC: 13.6 10*3/uL — ABNORMAL HIGH (ref 4.0–10.5)
nRBC: 0.3 % — ABNORMAL HIGH (ref 0.0–0.2)

## 2021-06-27 LAB — COMPREHENSIVE METABOLIC PANEL
ALT: 230 U/L — ABNORMAL HIGH (ref 0–44)
AST: 187 U/L — ABNORMAL HIGH (ref 15–41)
Albumin: 2 g/dL — ABNORMAL LOW (ref 3.5–5.0)
Alkaline Phosphatase: 143 U/L — ABNORMAL HIGH (ref 38–126)
Anion gap: 8 (ref 5–15)
BUN: 10 mg/dL (ref 6–20)
CO2: 26 mmol/L (ref 22–32)
Calcium: 8.7 mg/dL — ABNORMAL LOW (ref 8.9–10.3)
Chloride: 99 mmol/L (ref 98–111)
Creatinine, Ser: 1 mg/dL (ref 0.61–1.24)
GFR, Estimated: >60 mL/min (ref >60)
Glucose, Bld: 121 mg/dL — ABNORMAL HIGH (ref 70–99)
Potassium: 3.6 mmol/L (ref 3.5–5.1)
Sodium: 133 mmol/L — ABNORMAL LOW (ref 135–145)
Total Bilirubin: 1.6 mg/dL — ABNORMAL HIGH (ref 0.3–1.2)
Total Protein: 5.9 g/dL — ABNORMAL LOW (ref 6.5–8.1)

## 2021-06-27 LAB — LIPASE, BLOOD: Lipase: 49 U/L (ref 11–51)

## 2021-06-27 LAB — VITAMIN B1: Vitamin B1 (Thiamine): 221.2 nmol/L — ABNORMAL HIGH (ref 66.5–200.0)

## 2021-06-27 LAB — GLUCOSE, CAPILLARY
Glucose-Capillary: 119 mg/dL — ABNORMAL HIGH (ref 70–99)
Glucose-Capillary: 131 mg/dL — ABNORMAL HIGH (ref 70–99)
Glucose-Capillary: 170 mg/dL — ABNORMAL HIGH (ref 70–99)
Glucose-Capillary: 147 mg/dL — ABNORMAL HIGH (ref 70–99)

## 2021-06-27 MED ORDER — TECHNETIUM TC 99M MEBROFENIN IV KIT
5.3200 | PACK | Freq: Once | INTRAVENOUS | Status: AC | PRN
  Administered 2021-06-27: 5.32 via INTRAVENOUS

## 2021-06-27 MED ORDER — METOCLOPRAMIDE HCL 5 MG/ML IJ SOLN
5.0000 mg | Freq: Once | INTRAMUSCULAR | Status: AC
  Administered 2021-06-27: 5 mg via INTRAVENOUS
  Filled 2021-06-27: qty 2

## 2021-06-27 NOTE — Care Management Important Message (Signed)
Important Message  Patient Details  Name: Evan Chambers MRN: [id number] Date of Birth: 1960/10/26   Medicare Important Message Given:  Yes     Annaleigha Woo 06/27/2021, 4:09 PM
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
## 2021-06-28 DIAGNOSIS — I152 Hypertension secondary to endocrine disorders: Secondary | ICD-10-CM | POA: Diagnosis not present

## 2021-06-28 DIAGNOSIS — E1159 Type 2 diabetes mellitus with other circulatory complications: Secondary | ICD-10-CM | POA: Diagnosis not present

## 2021-06-28 DIAGNOSIS — N179 Acute kidney failure, unspecified: Secondary | ICD-10-CM | POA: Diagnosis not present

## 2021-06-28 DIAGNOSIS — E1169 Type 2 diabetes mellitus with other specified complication: Secondary | ICD-10-CM | POA: Diagnosis not present

## 2021-06-28 LAB — GLUCOSE, CAPILLARY
Glucose-Capillary: 117 mg/dL — ABNORMAL HIGH (ref 70–99)
Glucose-Capillary: 109 mg/dL — ABNORMAL HIGH (ref 70–99)
Glucose-Capillary: 120 mg/dL — ABNORMAL HIGH (ref 70–99)
Glucose-Capillary: 112 mg/dL — ABNORMAL HIGH (ref 70–99)

## 2021-06-28 LAB — COMPREHENSIVE METABOLIC PANEL
ALT: 172 U/L — ABNORMAL HIGH (ref 0–44)
AST: 129 U/L — ABNORMAL HIGH (ref 15–41)
Albumin: 1.8 g/dL — ABNORMAL LOW (ref 3.5–5.0)
Alkaline Phosphatase: 138 U/L — ABNORMAL HIGH (ref 38–126)
Anion gap: 8 (ref 5–15)
BUN: 10 mg/dL (ref 6–20)
CO2: 26 mmol/L (ref 22–32)
Calcium: 8.9 mg/dL (ref 8.9–10.3)
Chloride: 103 mmol/L (ref 98–111)
Creatinine, Ser: 0.85 mg/dL (ref 0.61–1.24)
GFR, Estimated: >60 mL/min (ref >60)
Glucose, Bld: 120 mg/dL — ABNORMAL HIGH (ref 70–99)
Potassium: 3.9 mmol/L (ref 3.5–5.1)
Sodium: 137 mmol/L (ref 135–145)
Total Bilirubin: 1.4 mg/dL — ABNORMAL HIGH (ref 0.3–1.2)
Total Protein: 6 g/dL — ABNORMAL LOW (ref 6.5–8.1)

## 2021-06-28 LAB — CBC
HCT: 27.2 % — ABNORMAL LOW (ref 39.0–52.0)
Hemoglobin: 9.3 g/dL — ABNORMAL LOW (ref 13.0–17.0)
MCH: 25.8 pg — ABNORMAL LOW (ref 26.0–34.0)
MCHC: 34.2 g/dL (ref 30.0–36.0)
MCV: 75.6 fL — ABNORMAL LOW (ref 80.0–100.0)
Platelets: 263 10*3/uL (ref 150–400)
RBC: 3.6 MIL/uL — ABNORMAL LOW (ref 4.22–5.81)
RDW: 19.9 % — ABNORMAL HIGH (ref 11.5–15.5)
WBC: 11.7 10*3/uL — ABNORMAL HIGH (ref 4.0–10.5)
nRBC: 0.4 % — ABNORMAL HIGH (ref 0.0–0.2)

## 2021-06-28 MED ORDER — BACLOFEN 10 MG PO TABS
5.0000 mg | ORAL_TABLET | Freq: Three times a day (TID) | ORAL | Status: AC
  Administered 2021-06-28 – 2021-06-29 (×3): 5 mg via ORAL
  Filled 2021-06-28 (×3): qty 1
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
## 2021-06-28 NOTE — Progress Notes (Signed)
   06/28/21 2347  Assess: MEWS Score  Temp (!) 100.5 F (38.1 C)  Pulse Rate (!) 114  ECG Heart Rate (!) 114  SpO2 92 %  O2 Device Room Air  Assess: MEWS Score  MEWS Temp 1  MEWS Systolic 0  MEWS Pulse 2  MEWS RR 0  MEWS LOC 0  MEWS Score 3  MEWS Score Color Yellow  Assess: if the MEWS score is Yellow or Red  Were vital signs taken at a resting state? Yes  Focused Assessment No change from prior assessment  Early Detection of Sepsis Score *See Row Information* Medium  MEWS guidelines implemented *See Row Information* Yes  Treat  MEWS Interventions Other (Comment) (continued to monitor)  Pain Scale 0-10  Pain Score 0  Take Vital Signs  Increase Vital Sign Frequency  Yellow: Q 2hr X 2 then Q 4hr X 2, if remains yellow, continue Q 4hrs  Escalate  MEWS: Escalate Yellow: discuss with charge nurse/RN and consider discussing with provider and RRT  Notify: Charge Nurse/RN  Name of Charge Nurse/RN Notified [place], RN  Date Charge Nurse/RN Notified 06/28/21  Time Charge Nurse/RN Notified [id number]

## 2021-06-29 ENCOUNTER — Inpatient Hospital Stay (HOSPITAL_COMMUNITY): Payer: Medicare HMO

## 2021-06-29 DIAGNOSIS — N170 Acute kidney failure with tubular necrosis: Secondary | ICD-10-CM | POA: Diagnosis not present

## 2021-06-29 LAB — GLUCOSE, CAPILLARY
Glucose-Capillary: 98 mg/dL (ref 70–99)
Glucose-Capillary: 98 mg/dL (ref 70–99)
Glucose-Capillary: 94 mg/dL (ref 70–99)
Glucose-Capillary: 94 mg/dL (ref 70–99)

## 2021-06-29 LAB — CBC
HCT: 27.8 % — ABNORMAL LOW (ref 39.0–52.0)
Hemoglobin: 9.8 g/dL — ABNORMAL LOW (ref 13.0–17.0)
MCH: 26.1 pg (ref 26.0–34.0)
MCHC: 35.3 g/dL (ref 30.0–36.0)
MCV: 74.1 fL — ABNORMAL LOW (ref 80.0–100.0)
Platelets: 305 10*3/uL (ref 150–400)
RBC: 3.75 MIL/uL — ABNORMAL LOW (ref 4.22–5.81)
RDW: 20.1 % — ABNORMAL HIGH (ref 11.5–15.5)
WBC: 11.1 10*3/uL — ABNORMAL HIGH (ref 4.0–10.5)
nRBC: 0 % (ref 0.0–0.2)

## 2021-06-29 LAB — COMPREHENSIVE METABOLIC PANEL
ALT: 158 U/L — ABNORMAL HIGH (ref 0–44)
AST: 142 U/L — ABNORMAL HIGH (ref 15–41)
Albumin: 1.7 g/dL — ABNORMAL LOW (ref 3.5–5.0)
Alkaline Phosphatase: 140 U/L — ABNORMAL HIGH (ref 38–126)
Anion gap: 11 (ref 5–15)
BUN: 10 mg/dL (ref 6–20)
CO2: 24 mmol/L (ref 22–32)
Calcium: 8.8 mg/dL — ABNORMAL LOW (ref 8.9–10.3)
Chloride: 99 mmol/L (ref 98–111)
Creatinine, Ser: 0.91 mg/dL (ref 0.61–1.24)
GFR, Estimated: >60 mL/min (ref >60)
Glucose, Bld: 102 mg/dL — ABNORMAL HIGH (ref 70–99)
Potassium: 4.2 mmol/L (ref 3.5–5.1)
Sodium: 134 mmol/L — ABNORMAL LOW (ref 135–145)
Total Bilirubin: 1.6 mg/dL — ABNORMAL HIGH (ref 0.3–1.2)
Total Protein: 6.1 g/dL — ABNORMAL LOW (ref 6.5–8.1)

## 2021-06-29 LAB — URINALYSIS, COMPLETE (UACMP) WITH MICROSCOPIC
Bilirubin Urine: NEGATIVE
Glucose, UA: NEGATIVE mg/dL
Hgb urine dipstick: NEGATIVE
Ketones, ur: 5 mg/dL — AB
Leukocytes,Ua: NEGATIVE
Nitrite: NEGATIVE
Protein, ur: NEGATIVE mg/dL
Specific Gravity, Urine: 1.015 (ref 1.005–1.030)
pH: 5 (ref 5.0–8.0)

## 2021-06-29 LAB — BODY FLUID CELL COUNT WITH DIFFERENTIAL: Total Nucleated Cell Count, Fluid: 12000 cu mm — ABNORMAL HIGH (ref 0–1000)

## 2021-06-29 LAB — PROCALCITONIN: Procalcitonin: 0.76 ng/mL

## 2021-06-29 LAB — LACTATE DEHYDROGENASE: LDH: 220 U/L — ABNORMAL HIGH (ref 98–192)

## 2021-06-29 LAB — ALBUMIN, PLEURAL OR PERITONEAL FLUID: Albumin, Fluid: <1 g/dL

## 2021-06-29 LAB — BRAIN NATRIURETIC PEPTIDE: B Natriuretic Peptide: 50.6 pg/mL (ref 0.0–100.0)

## 2021-06-29 LAB — C-REACTIVE PROTEIN: CRP: 44.4 mg/dL — ABNORMAL HIGH (ref <1.0)

## 2021-06-29 LAB — CK: Total CK: 113 U/L (ref 49–397)

## 2021-06-29 MED ORDER — LACTATED RINGERS IV SOLN: INTRAVENOUS | Status: DC

## 2021-06-29 MED ORDER — SODIUM CHLORIDE 0.9 % IV BOLUS
250.0000 mL | Freq: Once | INTRAVENOUS | Status: AC
  Administered 2021-06-29: 250 mL via INTRAVENOUS

## 2021-06-29 MED ORDER — SODIUM CHLORIDE 0.9 % IV SOLN: 2.0000 g | INTRAVENOUS | Status: DC

## 2021-06-29 MED ORDER — DICLOFENAC SODIUM 1 % EX GEL
2.0000 g | Freq: Three times a day (TID) | CUTANEOUS | Status: AC
  Administered 2021-06-29 – 2021-07-03 (×8): 2 g via TOPICAL
  Filled 2021-06-29 (×2): qty 100

## 2021-06-29 MED ORDER — THIAMINE HCL 100 MG PO TABS
500.0000 mg | ORAL_TABLET | Freq: Every day | ORAL | Status: DC
  Administered 2021-06-30 – 2021-07-17 (×15): 500 mg via ORAL
  Filled 2021-06-29 (×16): qty 5

## 2021-06-29 MED ORDER — SODIUM CHLORIDE 0.9 % IV SOLN: 500.0000 mg | INTRAVENOUS | Status: DC

## 2021-06-29 MED ORDER — LIDOCAINE HCL (PF) 1 % IJ SOLN
INTRAMUSCULAR | Status: AC
  Filled 2021-06-29: qty 30

## 2021-06-29 MED ORDER — BACLOFEN 10 MG PO TABS: 10.0000 mg | ORAL_TABLET | Freq: Three times a day (TID) | ORAL | Status: DC

## 2021-06-29 MED ORDER — BACLOFEN 20 MG PO TABS
20.0000 mg | ORAL_TABLET | Freq: Once | ORAL | Status: DC
  Filled 2021-06-29: qty 1

## 2021-06-29 MED ORDER — PIPERACILLIN-TAZOBACTAM 3.375 G IVPB
3.3750 g | Freq: Three times a day (TID) | INTRAVENOUS | Status: DC
  Administered 2021-06-29 – 2021-07-04 (×15): 3.375 g via INTRAVENOUS
  Filled 2021-06-29 (×17): qty 50

## 2021-06-29 MED ORDER — BACLOFEN 10 MG PO TABS
10.0000 mg | ORAL_TABLET | Freq: Three times a day (TID) | ORAL | Status: DC
  Administered 2021-06-29 – 2021-07-02 (×7): 10 mg via ORAL
  Filled 2021-06-29 (×8): qty 1

## 2021-06-29 MED ORDER — LACTATED RINGERS IV BOLUS
1000.0000 mL | Freq: Once | INTRAVENOUS | Status: AC
  Administered 2021-06-29: 1000 mL via INTRAVENOUS

## 2021-06-29 NOTE — Procedures (Signed)
PROCEDURE SUMMARY:  Successful US guided right thoracentesis. Yielded only 50 mL of purulent tan fluid. Patient tolerated procedure well. No immediate complications. EBL = trace  Specimen was sent for labs.  Post procedure chest X-ray reveals no pneumothorax  Tamara Monteith S Phuoc Huy PA-C 06/29/2021 12:46 PM

## 2021-06-29 NOTE — Progress Notes (Signed)
Pharmacy Antibiotic Note  Evan Chambers is a 60 y.o. male admitted on 06/23/2021 with  aspiration pneumonia .  Pharmacy has been consulted for piperacillin-tazobactam dosing.  Chest x-ray 9/25 shows new moderate-large right-sided pleural effusion with suspected underlying infiltrate. Patient had fever of 101.1 on night of 9/24. Tachycardic in 100s-110s. WBC trending down but still elevated at 11.1. SCr 1.70 upon admission, now down to 0.91.   Plan: Start Zosyn 3.375g IV q8h (4 hour infusion). Monitor renal function, vitals, and cultures if obtained. Follow-up LOT.   Height: 5\' 7"  (170.2 cm) Weight: 60.5 kg (133 lb 6.1 oz) IBW/kg (Calculated) : 66.1  Temp (24hrs), Avg:99.9 F (37.7 C), Min:98.2 F (36.8 C), Max:101.1 F (38.4 C)  Recent Labs  Lab 06/23/21 1700 06/23/21 2257 06/24/21 0417 06/25/21 0208 06/27/21 0127 06/28/21 0231 06/29/21 0022  WBC 15.8*  --  14.3* 16.2* 13.6* 11.7* 11.1*  CREATININE 2.73*  --  1.70* 1.06 1.00 0.85 0.91  LATICACIDVEN 2.7* 1.7  --   --   --   --   --     Estimated Creatinine Clearance: 74.8 mL/min (by C-G formula based on SCr of 0.91 mg/dL).    No Known Allergies  Antimicrobials this admission: Zosyn 9/25>>  Microbiology results: 9/23 BCx: NGTD  Donald Pore, PharmD Pharmacy Resident 06/29/2021, 8:23 AM

## 2021-06-29 NOTE — Progress Notes (Addendum)
HOSPITAL MEDICINE OVERNIGHT EVENT NOTE    Notified by nursing that patient is exhibiting a elevated mews score.  Patient is currently exhibiting increasingly worsening tachycardia with fever as high as 101.1 F this evening.  Patient is additionally intermittently tachypneic  Chart reviewed, patient currently being managed Warnicke encephalopathy and acute kidney injury.  Patient is currently not receiving any antibiotics for an infectious process.  Patient had 2 sets of blood cultures performed on 9/23 that were unremarkable.  Patient is unfortunately unable to report any symptoms due to his altered mentation.  We will obtain a repeat chest x-ray as well as a urinalysis and follow-up.  We will continue to monitor for further fever.  As needed antipyretics for fever.  Vernelle Emerald  MD [facility]    ADDENDUM 7am  Chest x-ray reveals new moderate/large right-sided pleural effusion with suspected underlying infiltrate.  Concern for new onset of pneumonia with parapneumonic effusion.  Considering patient's history of alcohol abuse I am particularly concerned about the possibility of aspiration and with this substantial suspect parapneumonic effusion and ongoing SIRS will place patient on intravenous Zosyn.  Initiating bolus followed by infusion of LR due to continued SIRS with high risk of developing sepsis.   Parapneumonic effusion will likely need drainage by IR during the day.  Sherryll Burger Kathalina Ostermann
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
## 2021-06-29 NOTE — Progress Notes (Signed)
   06/28/21 2357  Vitals  Temp (!) 101.1 F (38.4 C)  Temp Source Oral  Pulse Rate (!) 116  ECG Heart Rate (!) 116  Resp (!) 23  MEWS COLOR  MEWS Score Color Red  Oxygen Therapy  SpO2 92 %  O2 Device Room Air  MEWS Score  MEWS Temp 1  MEWS Systolic 0  MEWS Pulse 2  MEWS RR 1  MEWS LOC 0  MEWS Score 4  Provider Notification  Provider Name/Title G. Shalhoub  Date Provider Notified 06/29/21  Time Provider Notified 0002  Notification Type Page  Notification Reason Other (Comment) (RED MEWS)

## 2021-06-30 ENCOUNTER — Inpatient Hospital Stay (HOSPITAL_COMMUNITY): Payer: Medicare HMO

## 2021-06-30 DIAGNOSIS — N170 Acute kidney failure with tubular necrosis: Secondary | ICD-10-CM | POA: Diagnosis not present

## 2021-06-30 DIAGNOSIS — N179 Acute kidney failure, unspecified: Secondary | ICD-10-CM

## 2021-06-30 DIAGNOSIS — R4182 Altered mental status, unspecified: Secondary | ICD-10-CM

## 2021-06-30 DIAGNOSIS — J869 Pyothorax without fistula: Secondary | ICD-10-CM

## 2021-06-30 LAB — BRAIN NATRIURETIC PEPTIDE: B Natriuretic Peptide: 61.1 pg/mL (ref 0.0–100.0)

## 2021-06-30 LAB — CBC WITH DIFFERENTIAL/PLATELET
Abs Immature Granulocytes: 0.22 10*3/uL — ABNORMAL HIGH (ref 0.00–0.07)
Basophils Absolute: 0 10*3/uL (ref 0.0–0.1)
Basophils Relative: 0 %
Eosinophils Absolute: 0 10*3/uL (ref 0.0–0.5)
Eosinophils Relative: 0 %
HCT: 27.2 % — ABNORMAL LOW (ref 39.0–52.0)
Hemoglobin: 9.2 g/dL — ABNORMAL LOW (ref 13.0–17.0)
Immature Granulocytes: 2 %
Lymphocytes Relative: 7 %
Lymphs Abs: 0.8 10*3/uL (ref 0.7–4.0)
MCH: 25.3 pg — ABNORMAL LOW (ref 26.0–34.0)
MCHC: 33.8 g/dL (ref 30.0–36.0)
MCV: 74.9 fL — ABNORMAL LOW (ref 80.0–100.0)
Monocytes Absolute: 1.2 10*3/uL — ABNORMAL HIGH (ref 0.1–1.0)
Monocytes Relative: 9 %
Neutro Abs: 10.7 10*3/uL — ABNORMAL HIGH (ref 1.7–7.7)
Neutrophils Relative %: 82 %
Platelets: 346 10*3/uL (ref 150–400)
RBC: 3.63 MIL/uL — ABNORMAL LOW (ref 4.22–5.81)
RDW: 19.9 % — ABNORMAL HIGH (ref 11.5–15.5)
WBC: 13 10*3/uL — ABNORMAL HIGH (ref 4.0–10.5)
nRBC: 0 % (ref 0.0–0.2)

## 2021-06-30 LAB — COMPREHENSIVE METABOLIC PANEL
ALT: 135 U/L — ABNORMAL HIGH (ref 0–44)
AST: 129 U/L — ABNORMAL HIGH (ref 15–41)
Albumin: 1.6 g/dL — ABNORMAL LOW (ref 3.5–5.0)
Alkaline Phosphatase: 152 U/L — ABNORMAL HIGH (ref 38–126)
Anion gap: 13 (ref 5–15)
BUN: 11 mg/dL (ref 6–20)
CO2: 22 mmol/L (ref 22–32)
Calcium: 8.4 mg/dL — ABNORMAL LOW (ref 8.9–10.3)
Chloride: 102 mmol/L (ref 98–111)
Creatinine, Ser: 0.86 mg/dL (ref 0.61–1.24)
GFR, Estimated: >60 mL/min (ref >60)
Glucose, Bld: 82 mg/dL (ref 70–99)
Potassium: 4.8 mmol/L (ref 3.5–5.1)
Sodium: 137 mmol/L (ref 135–145)
Total Bilirubin: 1.4 mg/dL — ABNORMAL HIGH (ref 0.3–1.2)
Total Protein: 5.9 g/dL — ABNORMAL LOW (ref 6.5–8.1)

## 2021-06-30 LAB — GLUCOSE, CAPILLARY
Glucose-Capillary: 86 mg/dL (ref 70–99)
Glucose-Capillary: 124 mg/dL — ABNORMAL HIGH (ref 70–99)
Glucose-Capillary: 100 mg/dL — ABNORMAL HIGH (ref 70–99)
Glucose-Capillary: 107 mg/dL — ABNORMAL HIGH (ref 70–99)

## 2021-06-30 LAB — CYTOLOGY - NON PAP

## 2021-06-30 LAB — PROCALCITONIN: Procalcitonin: 1.99 ng/mL

## 2021-06-30 LAB — PROTEIN, PLEURAL OR PERITONEAL FLUID: Total protein, fluid: <3 g/dL

## 2021-06-30 LAB — C-REACTIVE PROTEIN: CRP: 47.8 mg/dL — ABNORMAL HIGH (ref <1.0)

## 2021-06-30 LAB — MAGNESIUM: Magnesium: 2 mg/dL (ref 1.7–2.4)

## 2021-06-30 LAB — MRSA NEXT GEN BY PCR, NASAL: MRSA by PCR Next Gen: NOT DETECTED

## 2021-06-30 MED ORDER — BOOST PLUS PO LIQD
237.0000 mL | Freq: Three times a day (TID) | ORAL | Status: DC
  Administered 2021-06-30 – 2021-07-08 (×17): 237 mL via ORAL
  Filled 2021-06-30 (×34): qty 237

## 2021-06-30 MED ORDER — LACTATED RINGERS IV SOLN: INTRAVENOUS | Status: AC

## 2021-06-30 MED ORDER — VANCOMYCIN HCL 1250 MG/250ML IV SOLN
1250.0000 mg | Freq: Once | INTRAVENOUS | Status: AC
  Administered 2021-06-30: 1250 mg via INTRAVENOUS
  Filled 2021-06-30: qty 250

## 2021-06-30 MED ORDER — VANCOMYCIN HCL 750 MG/150ML IV SOLN
750.0000 mg | Freq: Two times a day (BID) | INTRAVENOUS | Status: DC
  Administered 2021-06-30 – 2021-07-05 (×8): 750 mg via INTRAVENOUS
  Filled 2021-06-30 (×10): qty 150

## 2021-06-30 MED ORDER — IOHEXOL 350 MG/ML SOLN
60.0000 mL | Freq: Once | INTRAVENOUS | Status: AC | PRN
  Administered 2021-06-30: 60 mL via INTRAVENOUS
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
## 2021-06-30 NOTE — Progress Notes (Signed)
Pharmacy Antibiotic Note  Evan Chambers is a 60 y.o. male admitted on 06/23/2021 with pneumonia.  Pharmacy has been consulted for Vancomycin / Zosyn dosing.  Plan: Continue Zosyn 3.375 grams iv Q 8 hours - 4 hr infusion Vancomycin 1250 mg iv x 1 then 750 mg iv Q 12 hours (AUC of 520 using Scr of 1)  Follow up LOT, Scr  Height: 5\' 7"  (170.2 cm) Weight: 60.5 kg (133 lb 6.1 oz) IBW/kg (Calculated) : 66.1  Temp (24hrs), Avg:98.8 F (37.1 C), Min:98.2 F (36.8 C), Max:99.4 F (37.4 C)  Recent Labs  Lab 06/23/21 1700 06/23/21 2257 06/24/21 0417 06/25/21 0208 06/27/21 0127 06/28/21 0231 06/29/21 0022 06/30/21 0231  WBC 15.8*  --    < > 16.2* 13.6* 11.7* 11.1* 13.0*  CREATININE 2.73*  --    < > 1.06 1.00 0.85 0.91 0.86  LATICACIDVEN 2.7* 1.7  --   --   --   --   --   --    < > = values in this interval not displayed.    Estimated Creatinine Clearance: 79.1 mL/min (by C-G formula based on SCr of 0.86 mg/dL).    No Known Allergies  Thank you for allowing pharmacy to be a part of this patient's care. Anette Guarneri, PharmD  06/30/2021 12:35 PM

## 2021-06-30 NOTE — Progress Notes (Signed)
PT Cancellation Note  Patient Details Name: Evan Chambers MRN: [id number] DOB: Aug 06, 1961   Cancelled Treatment:    Reason Eval/Treat Not Completed: Patient at procedure or test/unavailable  Patient out of room.    Arby Barrette, PT Pager [phone]  Rexanne Mano 06/30/2021, 1:36 PM
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
## 2021-06-30 NOTE — Progress Notes (Addendum)
Modified Barium Swallow Progress Note  Patient Details  Name: Evan Chambers MRN: [id number] Date of Birth: 12-28-1960  Today's Date: 06/30/2021  Modified Barium Swallow completed.  Full report located under Chart Review in the Imaging Section.  Brief recommendations include the following:  Clinical Impression  Pt was seen in radiology suite for modified barium swallow study. Pt's head was at midline throughout the study with an initial cue to orient it from the left head turn. Trials of puree solids, regular texture solids, a 38mm barium tablet, and thin liquids via cup and straw were administered. Pt consistently chewed the barium tablet despite cues to swallow it whole. Pt's oropharyngeal swallow mechanism was within normal limits. Penetration (PAS 2) was noted once with consecutive swallows of thin liquids, but this is considered to be WNL. A regular texture diet with thin liquids is recommended at this time. SLP will see pt once more to ensure tolerance of the advanced diet, but it is anticipated that further SLP services will not be clinically indicated beyond that point.   Swallow Evaluation Recommendations       SLP Diet Recommendations: Regular solids;Thin liquid   Liquid Administration via: Cup;Straw   Medication Administration: Whole meds with puree/liquids as tolerated   Supervision: Patient able to self feed   Compensations: Slow rate;Small sips/bites   Postural Changes: Seated upright at 90 degrees          Evan Cartlidge I. Evan Chambers, Evan Chambers, Evan Chambers Office number [phone] Pager [phone]   Evan Chambers 06/30/2021,1:50 PM

## 2021-07-01 ENCOUNTER — Inpatient Hospital Stay (HOSPITAL_COMMUNITY): Payer: Medicare HMO | Admitting: Anesthesiology

## 2021-07-01 ENCOUNTER — Encounter (HOSPITAL_COMMUNITY)
Admission: EM | Disposition: A | Discharge status: Skilled Nursing Facility | Payer: Self-pay | Source: Home / Self Care | Attending: Internal Medicine

## 2021-07-01 ENCOUNTER — Inpatient Hospital Stay (HOSPITAL_COMMUNITY): Payer: Medicare HMO

## 2021-07-01 DIAGNOSIS — J869 Pyothorax without fistula: Secondary | ICD-10-CM

## 2021-07-01 DIAGNOSIS — N170 Acute kidney failure with tubular necrosis: Secondary | ICD-10-CM | POA: Diagnosis not present

## 2021-07-01 HISTORY — PX: VIDEO ASSISTED THORACOSCOPY (VATS)/DECORTICATION: SHX6171

## 2021-07-01 LAB — CBC WITH DIFFERENTIAL/PLATELET
Abs Immature Granulocytes: 0.16 10*3/uL — ABNORMAL HIGH (ref 0.00–0.07)
Basophils Absolute: 0 10*3/uL (ref 0.0–0.1)
Basophils Relative: 0 %
Eosinophils Absolute: 0.1 10*3/uL (ref 0.0–0.5)
Eosinophils Relative: 0 %
HCT: 23.6 % — ABNORMAL LOW (ref 39.0–52.0)
Hemoglobin: 8.4 g/dL — ABNORMAL LOW (ref 13.0–17.0)
Immature Granulocytes: 1 %
Lymphocytes Relative: 5 %
Lymphs Abs: 0.7 10*3/uL (ref 0.7–4.0)
MCH: 26.3 pg (ref 26.0–34.0)
MCHC: 35.6 g/dL (ref 30.0–36.0)
MCV: 73.8 fL — ABNORMAL LOW (ref 80.0–100.0)
Monocytes Absolute: 1 10*3/uL (ref 0.1–1.0)
Monocytes Relative: 6 %
Neutro Abs: 13.4 10*3/uL — ABNORMAL HIGH (ref 1.7–7.7)
Neutrophils Relative %: 88 %
Platelets: 372 10*3/uL (ref 150–400)
RBC: 3.2 MIL/uL — ABNORMAL LOW (ref 4.22–5.81)
RDW: 19.8 % — ABNORMAL HIGH (ref 11.5–15.5)
WBC: 15.4 10*3/uL — ABNORMAL HIGH (ref 4.0–10.5)
nRBC: 0.1 % (ref 0.0–0.2)

## 2021-07-01 LAB — MAGNESIUM: Magnesium: 1.9 mg/dL (ref 1.7–2.4)

## 2021-07-01 LAB — BLOOD GAS, ARTERIAL
Acid-base deficit: 0.9 mmol/L (ref 0.0–2.0)
Acid-base deficit: 5.6 mmol/L — ABNORMAL HIGH (ref 0.0–2.0)
Bicarbonate: 22.2 mmol/L (ref 20.0–28.0)
Bicarbonate: 18.4 mmol/L — ABNORMAL LOW (ref 20.0–28.0)
FIO2: 21
FIO2: 40
O2 Saturation: 97.4 %
O2 Saturation: 93.3 %
Patient temperature: 36.9
Patient temperature: 36.1
pCO2 arterial: 30.5 mmHg — ABNORMAL LOW (ref 32.0–48.0)
pCO2 arterial: 29.2 mmHg — ABNORMAL LOW (ref 32.0–48.0)
pH, Arterial: 7.475 — ABNORMAL HIGH (ref 7.350–7.450)
pH, Arterial: 7.411 (ref 7.350–7.450)
pO2, Arterial: 102 mmHg (ref 83.0–108.0)
pO2, Arterial: 69.9 mmHg — ABNORMAL LOW (ref 83.0–108.0)

## 2021-07-01 LAB — COMPREHENSIVE METABOLIC PANEL
ALT: 139 U/L — ABNORMAL HIGH (ref 0–44)
AST: 152 U/L — ABNORMAL HIGH (ref 15–41)
Albumin: 1.4 g/dL — ABNORMAL LOW (ref 3.5–5.0)
Alkaline Phosphatase: 216 U/L — ABNORMAL HIGH (ref 38–126)
Anion gap: 14 (ref 5–15)
BUN: 9 mg/dL (ref 6–20)
CO2: 21 mmol/L — ABNORMAL LOW (ref 22–32)
Calcium: 8 mg/dL — ABNORMAL LOW (ref 8.9–10.3)
Chloride: 99 mmol/L (ref 98–111)
Creatinine, Ser: 0.87 mg/dL (ref 0.61–1.24)
GFR, Estimated: >60 mL/min (ref >60)
Glucose, Bld: 102 mg/dL — ABNORMAL HIGH (ref 70–99)
Potassium: 3.5 mmol/L (ref 3.5–5.1)
Sodium: 134 mmol/L — ABNORMAL LOW (ref 135–145)
Total Bilirubin: 1.2 mg/dL (ref 0.3–1.2)
Total Protein: 5.6 g/dL — ABNORMAL LOW (ref 6.5–8.1)

## 2021-07-01 LAB — C-REACTIVE PROTEIN: CRP: 42.1 mg/dL — ABNORMAL HIGH (ref <1.0)

## 2021-07-01 LAB — HEMOGLOBIN AND HEMATOCRIT, BLOOD
HCT: 28.7 % — ABNORMAL LOW (ref 39.0–52.0)
Hemoglobin: 10.1 g/dL — ABNORMAL LOW (ref 13.0–17.0)

## 2021-07-01 LAB — PREPARE RBC (CROSSMATCH)

## 2021-07-01 LAB — PROTIME-INR
INR: 1.1 (ref 0.8–1.2)
Prothrombin Time: 14.1 seconds (ref 11.4–15.2)

## 2021-07-01 LAB — BRAIN NATRIURETIC PEPTIDE: B Natriuretic Peptide: 88 pg/mL (ref 0.0–100.0)

## 2021-07-01 LAB — GLUCOSE, CAPILLARY
Glucose-Capillary: 136 mg/dL — ABNORMAL HIGH (ref 70–99)
Glucose-Capillary: 122 mg/dL — ABNORMAL HIGH (ref 70–99)
Glucose-Capillary: 109 mg/dL — ABNORMAL HIGH (ref 70–99)

## 2021-07-01 LAB — PROCALCITONIN: Procalcitonin: 0.8 ng/mL

## 2021-07-01 SURGERY — VIDEO ASSISTED THORACOSCOPY (VATS)/DECORTICATION
Anesthesia: General | Site: Chest | Laterality: Right

## 2021-07-01 MED ORDER — LIDOCAINE 2% (20 MG/ML) 5 ML SYRINGE
INTRAMUSCULAR | Status: DC | PRN
  Administered 2021-07-01: 40 mg via INTRAVENOUS

## 2021-07-01 MED ORDER — PROPOFOL 10 MG/ML IV BOLUS
INTRAVENOUS | Status: AC
  Filled 2021-07-01: qty 20

## 2021-07-01 MED ORDER — SODIUM CHLORIDE 0.9 % IV BOLUS: 500.0000 mL | Freq: Once | INTRAVENOUS | Status: DC

## 2021-07-01 MED ORDER — SENNOSIDES-DOCUSATE SODIUM 8.6-50 MG PO TABS
1.0000 | ORAL_TABLET | Freq: Every day | ORAL | Status: DC
  Administered 2021-07-01 – 2021-07-08 (×7): 1 via ORAL
  Filled 2021-07-01 (×8): qty 1

## 2021-07-01 MED ORDER — LACTATED RINGERS IV SOLN: INTRAVENOUS | Status: DC | PRN

## 2021-07-01 MED ORDER — ROCURONIUM BROMIDE 10 MG/ML (PF) SYRINGE
PREFILLED_SYRINGE | INTRAVENOUS | Status: DC | PRN
  Administered 2021-07-01: 40 mg via INTRAVENOUS
  Administered 2021-07-01: 60 mg via INTRAVENOUS

## 2021-07-01 MED ORDER — TRAMADOL HCL 50 MG PO TABS
50.0000 mg | ORAL_TABLET | Freq: Four times a day (QID) | ORAL | Status: DC | PRN
  Administered 2021-07-02: 50 mg via ORAL
  Filled 2021-07-01: qty 1

## 2021-07-01 MED ORDER — LACTATED RINGERS IV SOLN: INTRAVENOUS | Status: DC

## 2021-07-01 MED ORDER — FENTANYL CITRATE (PF) 250 MCG/5ML IJ SOLN
INTRAMUSCULAR | Status: AC
  Filled 2021-07-01: qty 5

## 2021-07-01 MED ORDER — ONDANSETRON HCL 4 MG/2ML IJ SOLN
4.0000 mg | Freq: Four times a day (QID) | INTRAMUSCULAR | Status: DC | PRN
  Administered 2021-07-09: 4 mg via INTRAVENOUS
  Filled 2021-07-01: qty 2

## 2021-07-01 MED ORDER — PHENYLEPHRINE HCL-NACL 20-0.9 MG/250ML-% IV SOLN
INTRAVENOUS | Status: DC | PRN
Start: 2021-07-01 — End: 2021-07-01
  Administered 2021-07-01: 25 ug/min via INTRAVENOUS

## 2021-07-01 MED ORDER — FENTANYL CITRATE PF 50 MCG/ML IJ SOSY
25.0000 ug | PREFILLED_SYRINGE | INTRAMUSCULAR | Status: DC | PRN
  Administered 2021-07-02: 25 ug via INTRAVENOUS
  Filled 2021-07-01: qty 1

## 2021-07-01 MED ORDER — BISACODYL 5 MG PO TBEC
10.0000 mg | DELAYED_RELEASE_TABLET | Freq: Every day | ORAL | Status: DC
  Administered 2021-07-02 – 2021-07-08 (×3): 10 mg via ORAL
  Filled 2021-07-01 (×4): qty 2

## 2021-07-01 MED ORDER — OXYCODONE HCL 5 MG PO TABS: 5.0000 mg | ORAL_TABLET | ORAL | Status: DC | PRN

## 2021-07-01 MED ORDER — MIDAZOLAM HCL 5 MG/5ML IJ SOLN
INTRAMUSCULAR | Status: DC | PRN
  Administered 2021-07-01: 1 mg via INTRAVENOUS

## 2021-07-01 MED ORDER — ENOXAPARIN SODIUM 40 MG/0.4ML IJ SOSY
40.0000 mg | PREFILLED_SYRINGE | INTRAMUSCULAR | Status: DC
  Administered 2021-07-02 – 2021-07-09 (×7): 40 mg via SUBCUTANEOUS
  Filled 2021-07-01 (×8): qty 0.4

## 2021-07-01 MED ORDER — ONDANSETRON HCL 4 MG/2ML IJ SOLN
INTRAMUSCULAR | Status: DC | PRN
  Administered 2021-07-01: 4 mg via INTRAVENOUS

## 2021-07-01 MED ORDER — PHENYLEPHRINE 40 MCG/ML (10ML) SYRINGE FOR IV PUSH (FOR BLOOD PRESSURE SUPPORT)
PREFILLED_SYRINGE | INTRAVENOUS | Status: DC | PRN
  Administered 2021-07-01: 40 ug via INTRAVENOUS
  Administered 2021-07-01: 80 ug via INTRAVENOUS

## 2021-07-01 MED ORDER — AMISULPRIDE (ANTIEMETIC) 5 MG/2ML IV SOLN: 10.0000 mg | Freq: Once | INTRAVENOUS | Status: DC | PRN

## 2021-07-01 MED ORDER — LACTATED RINGERS IV SOLN
INTRAVENOUS | Status: DC | PRN
Start: 2021-07-01 — End: 2021-07-01

## 2021-07-01 MED ORDER — FENTANYL CITRATE (PF) 100 MCG/2ML IJ SOLN
INTRAMUSCULAR | Status: DC | PRN
  Administered 2021-07-01 (×2): 50 ug via INTRAVENOUS
  Administered 2021-07-01: 25 ug via INTRAVENOUS

## 2021-07-01 MED ORDER — SODIUM CHLORIDE 0.9% IV SOLUTION: Freq: Once | INTRAVENOUS | Status: DC

## 2021-07-01 MED ORDER — PROPOFOL 10 MG/ML IV BOLUS
INTRAVENOUS | Status: DC | PRN
  Administered 2021-07-01: 150 mg via INTRAVENOUS

## 2021-07-01 MED ORDER — ALBUMIN HUMAN 5 % IV SOLN
INTRAVENOUS | Status: AC
  Filled 2021-07-01: qty 250

## 2021-07-01 MED ORDER — SUGAMMADEX SODIUM 200 MG/2ML IV SOLN
INTRAVENOUS | Status: DC | PRN
  Administered 2021-07-01: 200 mg via INTRAVENOUS

## 2021-07-01 MED ORDER — FENTANYL CITRATE (PF) 100 MCG/2ML IJ SOLN: 25.0000 ug | INTRAMUSCULAR | Status: DC | PRN

## 2021-07-01 MED ORDER — SODIUM CHLORIDE 0.9 % IV SOLN
INTRAVENOUS | Status: DC | PRN
Start: 2021-07-01 — End: 2021-07-01

## 2021-07-01 MED ORDER — ALBUMIN HUMAN 5 % IV SOLN
12.5000 g | Freq: Once | INTRAVENOUS | Status: AC
  Administered 2021-07-01: 12.5 g via INTRAVENOUS

## 2021-07-01 MED ORDER — 0.9 % SODIUM CHLORIDE (POUR BTL) OPTIME
TOPICAL | Status: DC | PRN
  Administered 2021-07-01 (×2): 1000 mL
  Administered 2021-07-01: 2000 mL

## 2021-07-01 MED ORDER — MIDAZOLAM HCL 2 MG/2ML IJ SOLN
INTRAMUSCULAR | Status: AC
  Filled 2021-07-01: qty 2

## 2021-07-01 MED ORDER — POTASSIUM CHLORIDE IN NACL 20-0.9 MEQ/L-% IV SOLN
INTRAVENOUS | Status: DC
  Filled 2021-07-01 (×7): qty 1000

## 2021-07-01 MED ORDER — ESMOLOL HCL 100 MG/10ML IV SOLN
INTRAVENOUS | Status: DC | PRN
  Administered 2021-07-01 (×2): 30 mg via INTRAVENOUS

## 2021-07-01 MED ORDER — SODIUM CHLORIDE 0.9 % IV BOLUS
500.0000 mL | Freq: Once | INTRAVENOUS | Status: AC
  Administered 2021-07-01: 500 mL via INTRAVENOUS

## 2021-07-01 SURGICAL SUPPLY — 85 items
APPLICATOR TIP EXT COSEAL (VASCULAR PRODUCTS) IMPLANT
BLADE CLIPPER SURG (BLADE) ×2 IMPLANT
CANISTER SUCT 3000ML PPV (MISCELLANEOUS) ×2 IMPLANT
CATH THORACIC 28FR (CATHETERS) IMPLANT
CATH THORACIC 28FR RT ANG (CATHETERS) IMPLANT
CATH THORACIC 36FR (CATHETERS) IMPLANT
CATH THORACIC 36FR RT ANG (CATHETERS) IMPLANT
CLIP VESOCCLUDE MED 6/CT (CLIP) ×2 IMPLANT
CNTNR URN SCR LID CUP LEK RST (MISCELLANEOUS) ×3 IMPLANT
CONN ST 1/4X3/8  BEN (MISCELLANEOUS) ×4
CONN ST 1/4X3/8 BEN (MISCELLANEOUS) ×2 IMPLANT
CONN Y 3/8X3/8X3/8  BEN (MISCELLANEOUS)
CONN Y 3/8X3/8X3/8 BEN (MISCELLANEOUS) IMPLANT
CONT SPEC 4OZ STRL OR WHT (MISCELLANEOUS) ×6
COVER SURGICAL LIGHT HANDLE (MISCELLANEOUS) ×2 IMPLANT
DERMABOND ADVANCED (GAUZE/BANDAGES/DRESSINGS)
DERMABOND ADVANCED .7 DNX12 (GAUZE/BANDAGES/DRESSINGS) IMPLANT
DRAIN CHANNEL 28F RND 3/8 FF (WOUND CARE) ×4 IMPLANT
DRAIN CHANNEL 32F RND 10.7 FF (WOUND CARE) IMPLANT
DRAPE CV SPLIT W-CLR ANES SCRN (DRAPES) ×2 IMPLANT
DRAPE ORTHO SPLIT 77X108 STRL (DRAPES) ×2
DRAPE SURG ORHT 6 SPLT 77X108 (DRAPES) ×1 IMPLANT
DRAPE WARM FLUID 44X44 (DRAPES) ×2 IMPLANT
DRSG AQUACEL AG ADV 3.5X 6 (GAUZE/BANDAGES/DRESSINGS) ×2 IMPLANT
ELECT BLADE 6.5 EXT (BLADE) ×2 IMPLANT
ELECT REM PT RETURN 9FT ADLT (ELECTROSURGICAL) ×2
ELECTRODE REM PT RTRN 9FT ADLT (ELECTROSURGICAL) ×1 IMPLANT
GAUZE 4X4 16PLY ~~LOC~~+RFID DBL (SPONGE) ×2 IMPLANT
GAUZE SPONGE 4X4 12PLY STRL (GAUZE/BANDAGES/DRESSINGS) ×2 IMPLANT
GAUZE SPONGE 4X4 12PLY STRL LF (GAUZE/BANDAGES/DRESSINGS) ×2 IMPLANT
GLOVE SURG SIGNA 7.5 PF LTX (GLOVE) ×4 IMPLANT
GLOVE SURG UNDER POLY LF SZ6.5 (GLOVE) ×2 IMPLANT
GOWN STRL REUS W/ TWL LRG LVL3 (GOWN DISPOSABLE) ×2 IMPLANT
GOWN STRL REUS W/ TWL XL LVL3 (GOWN DISPOSABLE) ×1 IMPLANT
GOWN STRL REUS W/TWL LRG LVL3 (GOWN DISPOSABLE) ×4
GOWN STRL REUS W/TWL XL LVL3 (GOWN DISPOSABLE) ×2
HANDLE STAPLE ENDO GIA SHORT (STAPLE)
HEMOSTAT SURGICEL 2X14 (HEMOSTASIS) IMPLANT
KIT BASIN OR (CUSTOM PROCEDURE TRAY) ×2 IMPLANT
KIT TURNOVER KIT B (KITS) ×2 IMPLANT
NEEDLE HYPO 25GX1X1/2 BEV (NEEDLE) ×2 IMPLANT
NEEDLE SPNL 18GX3.5 QUINCKE PK (NEEDLE) IMPLANT
NEEDLE SPNL 22GX3.5 QUINCKE BK (NEEDLE) ×2 IMPLANT
NS IRRIG 1000ML POUR BTL (IV SOLUTION) ×6 IMPLANT
PACK CHEST (CUSTOM PROCEDURE TRAY) ×2 IMPLANT
PAD ARMBOARD 7.5X6 YLW CONV (MISCELLANEOUS) ×4 IMPLANT
POUCH ENDO CATCH II 15MM (MISCELLANEOUS) IMPLANT
POUCH SPECIMEN RETRIEVAL 10MM (ENDOMECHANICALS) IMPLANT
SEALANT PROGEL (MISCELLANEOUS) IMPLANT
SEALANT SURG COSEAL 4ML (VASCULAR PRODUCTS) IMPLANT
SEALANT SURG COSEAL 8ML (VASCULAR PRODUCTS) IMPLANT
SOL ANTI FOG 6CC (MISCELLANEOUS) ×1 IMPLANT
SOLUTION ANTI FOG 6CC (MISCELLANEOUS) ×1
SPONGE INTESTINAL PEANUT (DISPOSABLE) ×14 IMPLANT
SPONGE T-LAP 18X18 ~~LOC~~+RFID (SPONGE) ×4 IMPLANT
SPONGE TONSIL TAPE 1 RFD (DISPOSABLE) ×2 IMPLANT
STAPLER ENDO GIA 12MM SHORT (STAPLE) IMPLANT
SUT PROLENE 4 0 RB 1 (SUTURE)
SUT PROLENE 4-0 RB1 .5 CRCL 36 (SUTURE) IMPLANT
SUT SILK  1 MH (SUTURE) ×4
SUT SILK 1 MH (SUTURE) ×2 IMPLANT
SUT SILK 1 TIES 10X30 (SUTURE) ×2 IMPLANT
SUT SILK 2 0SH CR/8 30 (SUTURE) IMPLANT
SUT SILK 3 0SH CR/8 30 (SUTURE) IMPLANT
SUT VIC AB 1 CTX 36 (SUTURE) ×2
SUT VIC AB 1 CTX36XBRD ANBCTR (SUTURE) ×1 IMPLANT
SUT VIC AB 2-0 CTX 36 (SUTURE) ×2 IMPLANT
SUT VIC AB 3-0 MH 27 (SUTURE) IMPLANT
SUT VIC AB 3-0 X1 27 (SUTURE) IMPLANT
SUT VICRYL 2 TP 1 (SUTURE) IMPLANT
SWAB COLLECTION DEVICE MRSA (MISCELLANEOUS) ×2 IMPLANT
SWAB CULTURE ESWAB REG 1ML (MISCELLANEOUS) ×2 IMPLANT
SYR 10ML LL (SYRINGE) IMPLANT
SYR 30ML LL (SYRINGE) ×2 IMPLANT
SYSTEM SAHARA CHEST DRAIN ATS (WOUND CARE) ×2 IMPLANT
TAPE CLOTH 4X10 WHT NS (GAUZE/BANDAGES/DRESSINGS) ×2 IMPLANT
TAPE CLOTH SURG 4X10 WHT LF (GAUZE/BANDAGES/DRESSINGS) ×2 IMPLANT
TIP APPLICATOR SPRAY EXTEND 16 (VASCULAR PRODUCTS) IMPLANT
TOWEL GREEN STERILE (TOWEL DISPOSABLE) ×2 IMPLANT
TOWEL GREEN STERILE FF (TOWEL DISPOSABLE) ×2 IMPLANT
TRAP SPECIMEN MUCUS 40CC (MISCELLANEOUS) ×2 IMPLANT
TRAY FOLEY MTR SLVR 16FR STAT (SET/KITS/TRAYS/PACK) ×2 IMPLANT
TROCAR XCEL BLADELESS 5X75MML (TROCAR) ×2 IMPLANT
TROCAR XCEL NON-BLD 11X100MML (ENDOMECHANICALS) ×2 IMPLANT
WATER STERILE IRR 1000ML POUR (IV SOLUTION) ×2 IMPLANT

## 2021-07-01 NOTE — Progress Notes (Signed)
PT Cancellation Note  Patient Details Name: Evan Chambers MRN: [id number] DOB: [birth year]   Cancelled Treatment:    Reason Eval/Treat Not Completed: Patient at procedure or test/unavailable  Patient in OR.    Arby Barrette, PT Pager [phone]  Rexanne Mano 07/01/2021, 10:09 AM

## 2021-07-01 NOTE — Transfer of Care (Signed)
Immediate Anesthesia Transfer of Care Note  Patient: Evan Chambers  Procedure(s) Performed: VIDEO ASSISTED THORACOSCOPY (VATS)/DECORTICATION (Right: Chest)  Patient Location: PACU  Anesthesia Type:General  Level of Consciousness: drowsy  Airway & Oxygen Therapy: Patient Spontanous Breathing and Patient connected to nasal cannula oxygen  Post-op Assessment: Report given to RN and Post -op Vital signs reviewed and stable  Post vital signs: Reviewed and stable  Last Vitals:  Vitals Value Taken Time  BP 139/102 07/01/21 1130  Temp    Pulse 123 07/01/21 1130  Resp 44 07/01/21 1132  SpO2 91 % 07/01/21 1130  Vitals shown include unvalidated device data.  Last Pain:  Vitals:   07/01/21 0400  TempSrc: Oral  PainSc: 0-No pain         Complications: No notable events documented.

## 2021-07-01 NOTE — Progress Notes (Addendum)
HOSPITAL MEDICINE OVERNIGHT EVENT NOTE    Notified by nursing that patient is exhibiting low blood pressures with systolic blood pressure in the 80s and maps in the upper 50s. No significant change in oxygenation.  Patient is not exhibiting any shortness of breath or change in mentation although patient has already seemingly been encephalopathic.    Patient continues to exhibit an elevated mews score.  Chart reviewed, currently being treated for ongoing complicated pulmonary infection secondary to chronic aspiration pneumonia and right-sided lung abscess with empyema.  Patient is already on appropriate biotics and is on a continuous normal saline infusion.  Provided patient with 500 cc normal saline bolus, will reassess blood pressures afterwards.  Following closely.  Vernelle Emerald  MD [facility]   ADDENDUM (9/28 12:40am)  BP responded well to fluid bolus, now 115/67.  Sherryll Burger Lew Prout

## 2021-07-01 NOTE — Brief Op Note (Addendum)
06/23/2021 - 07/01/2021  11:02 AM  PATIENT:  Evan Chambers  60 y.o. male  PRE-OPERATIVE DIAGNOSIS:  RIGHT EMPYEMA  POST-OPERATIVE DIAGNOSIS:  RIGHT EMPYEMA, PROBABLE RIGHT LUNG ABSCESS, NECROTIC PORTION of RLL  PROCEDURE:  RIGHT VIDEO ASSISTED THORACOSCOPY, MINI RIGHT THORACOTOMY, RIGHT DECORTICATION ,DRAINAGE OF EMPYEMA  SURGEON:  Surgeon(s) and Role:    [name], MD - Primary  PHYSICIAN ASSISTANT: Lars Pinks PA-C  ANESTHESIA:   general  EBL:  250 mL   BLOOD ADMINISTERED: One CC PRBC  DRAINS:  Chest tubes placed in the right pleural space    SPECIMEN:  Source of Specimen:  Right pleural fluid and peel  DISPOSITION OF SPECIMEN:   Pathology, culture  COUNTS CORRECT:  YES  DICTATION: .Dragon Dictation  PLAN OF CARE: Admit to inpatient   PATIENT DISPOSITION:  PACU - hemodynamically stable.   Delay start of Pharmacological VTE agent (>24hrs) due to surgical blood loss or risk of bleeding: yes

## 2021-07-01 NOTE — Anesthesia Procedure Notes (Signed)
Arterial Line Insertion Start/End9/27/2022 6:50 AM, 07/01/2021 7:07 AM Performed by: Josephine Igo, CRNA, CRNA  Patient location: Pre-op. Preanesthetic checklist: patient identified, IV checked, monitors and equipment checked and pre-op evaluation Lidocaine 1% used for infiltration Left, radial was placed Hand hygiene performed  and maximum sterile barriers used   Attempts: 1 Procedure performed without using ultrasound guided technique. Following insertion, dressing applied and Biopatch. Post procedure assessment: normal  Patient tolerated the procedure well with no immediate complications.

## 2021-07-01 NOTE — Discharge Instructions (Addendum)
ACTIVITY:  1.Increase activity slowly. 2.Walk daily and increase frequency and duration as tolerates. 3.May walk up steps. 4.No lifting more than ten pounds for two weeks. 5.No driving for two weeks. 6.Avoid straining. 7.STOP any activity that causes chest pain, shortness of breath, dizziness,sweating,     or excessive weakness. 8.Continue with breathing exercises daily.   WOUND:  1.May shower;please remove all dressings before taking a shower. 2.Clean wounds with mild soap and water.  Call the office at [phone] if any problems arise. Thoracoscopy, Care After This sheet gives you information about how to care for yourself after your procedure. Your health care provider may also give you more specific instructions. If you have problems or questions, contact your health care provider. What can I expect after the procedure? After the procedure, it is common to have pain and soreness in the surgical area. Follow these instructions at home: Incision care Follow instructions from your health care provider about how to take care of your incision. Make sure you: Wash your hands with soap and water before you change your bandage (dressing). If soap and water are not available, use hand sanitizer. Change your dressing as told by your health care provider. Leave stitches (sutures), skin glue, or adhesive strips in place. These skin closures may need to stay in place for 2 weeks or longer. If adhesive strip edges start to loosen and curl up, you may trim the loose edges. Do not remove adhesive strips completely unless your health care provider tells you to do that. Check your incision areas every day for signs of infection. Check for: Redness, swelling, or pain. Fluid or blood. Warmth. Pus or a bad smell. Do not take baths, swim, or use a hot tub until your health care provider approves. You may take showers. Medicines Take over-the-counter and prescription medicines only as told by  your health care provider. If you were prescribed an antibiotic medicine, take it as told by your health care provider. Do not stop taking the antibiotic even if you start to feel better. Do not drive or use heavy machinery while taking prescription pain medicine. If you are taking prescription pain medicine, take actions to prevent or treat constipation. Your health care provider may recommend that you: Drink enough fluid to keep your urine pale yellow. Eat foods that are high in fiber, such as fresh fruits and vegetables, whole grains, and beans. Limit foods that are high in fat and processed sugars, such as fried and sweet foods. Take an over-the-counter or prescription medicine for constipation. Managing pain, stiffness, and swelling If directed, put ice on the affected area: Put ice in a plastic bag. Place a towel between your skin and the bag. Leave the ice on for 20 minutes, 2-3 times a day. Preventing lung infection To prevent pneumonia and to keep your lungs healthy: Try to cough often. If it hurts to cough, hold a pillow against your chest as you cough. Take deep breaths or do breathing exercises as instructed by your health care provider. If you were given an incentive spirometer, use it as directed by your health care provider. General instructions Do not lift anything that is heavier than 10 lb (4.5 kg), or the limit that you are told, until your health care provider says that it is safe. Do not use any products that contain nicotine or tobacco, such as cigarettes and e-cigarettes. These can delay healing after surgery. If you need help quitting, ask your health care provider. Avoid driving  until your health care provider approves. If you have a chest drainage tube, care for it as instructed by your health care provider. Do not travel by airplane after the chest drainage tube is removed until your health care provider approves. Keep all follow-up visits as told by your health care  provider. This is important. Contact a health care provider if: You have a fever. Pain medicines do not ease your pain. You have redness, swelling, or increasing pain in your incision area. You develop a cough that does not go away, or you are coughing up mucus that is yellow or green. Get help right away if: You have fluid, blood, or pus coming from your incision. There is a bad smell coming from your incision or dressing. You develop a rash. You cough up blood. You develop light-headedness, or you feel faint. You have difficulty breathing. You develop chest pain. Your heartbeat feels irregular or very fast. These symptoms may represent a serious problem that is an emergency. Do not wait to see if the symptoms will go away. Get medical help right away. Call your local emergency services (911 in the [place] Do not drive yourself to the hospital. Summary Follow instructions from your health care provider about how to take care of your incision. Do not drive or use heavy machinery while taking prescription pain medicine. Leave stitches (sutures), skin glue, or adhesive strips in place. Check your incision areas every day for signs of infection. This information is not intended to replace advice given to you by your health care provider. Make sure you discuss any questions you have with your health care provider. Document Revised: 05/14/2020 Document Reviewed: 05/23/2020 Elsevier Patient Education  2022 [employer].

## 2021-07-01 NOTE — Anesthesia Procedure Notes (Signed)
Procedure Name: Intubation Date/Time: 07/01/2021 8:25 AM Performed by: Georgia Duff, CRNA Pre-anesthesia Checklist: Patient identified, Emergency Drugs available, Suction available and Patient being monitored Patient Re-evaluated:Patient Re-evaluated prior to induction Oxygen Delivery Method: Circle System Utilized Preoxygenation: Pre-oxygenation with 100% oxygen Induction Type: IV induction Ventilation: Mask ventilation without difficulty Laryngoscope Size: Miller and 2 Grade View: Grade I Tube type: Oral Endobronchial tube: Double lumen EBT and Left and 39 Fr Number of attempts: 1 Airway Equipment and Method: Stylet and Oral airway Placement Confirmation: ETT inserted through vocal cords under direct vision, positive ETCO2 and breath sounds checked- equal and bilateral Tube secured with: Tape Dental Injury: Teeth and Oropharynx as per pre-operative assessment

## 2021-07-01 NOTE — Anesthesia Preprocedure Evaluation (Addendum)
Anesthesia Evaluation  Patient identified by MRN, date of birth, ID band Patient awake and Patient confused    Reviewed: Allergy & Precautions, NPO status , Patient's Chart, lab work & pertinent test results  Airway Mallampati: II  TM Distance: >3 FB Neck ROM: Full    Dental  (+) Dental Advisory Given   Pulmonary pneumonia,  PNA with empyema   breath sounds clear to auscultation       Cardiovascular hypertension, Pt. on medications  Rhythm:Regular Rate:Normal     Neuro/Psych Wernicke's encephalopathy     GI/Hepatic Neg liver ROS, hiatal hernia,   Endo/Other  diabetes  Renal/GU Renal disease     Musculoskeletal   Abdominal   Peds  Hematology  (+) anemia ,   Anesthesia Other Findings   Reproductive/Obstetrics                            Lab Results  Component Value Date   WBC 15.4 (H) 07/01/2021   HGB 8.4 (L) 07/01/2021   HCT 23.6 (L) 07/01/2021   MCV 73.8 (L) 07/01/2021   PLT 372 07/01/2021   Lab Results  Component Value Date   CREATININE 0.87 07/01/2021   BUN 9 07/01/2021   NA 134 (L) 07/01/2021   K 3.5 07/01/2021   CL 99 07/01/2021   CO2 21 (L) 07/01/2021    Anesthesia Physical Anesthesia Plan  ASA: 4  Anesthesia Plan: General   Post-op Pain Management:    Induction: Intravenous  PONV Risk Score and Plan: 2 and Dexamethasone, Ondansetron and Treatment may vary due to age or medical condition  Airway Management Planned: Double Lumen EBT  Additional Equipment: Arterial line  Intra-op Plan:   Post-operative Plan: Extubation in OR and Possible Post-op intubation/ventilation  Informed Consent: I have reviewed the patients History and Physical, chart, labs and discussed the procedure including the risks, benefits and alternatives for the proposed anesthesia with the patient or authorized representative who has indicated his/her understanding and acceptance.      Dental advisory given  Plan Discussed with: CRNA  Anesthesia Plan Comments:        Anesthesia Quick Evaluation

## 2021-07-01 NOTE — Anesthesia Postprocedure Evaluation (Signed)
Anesthesia Post Note  Patient: Evan Chambers  Procedure(s) Performed: VIDEO ASSISTED THORACOSCOPY (VATS)/DECORTICATION (Right: Chest)     Patient location during evaluation: PACU Anesthesia Type: General Level of consciousness: awake and alert Pain management: pain level controlled Vital Signs Assessment: post-procedure vital signs reviewed and stable Respiratory status: spontaneous breathing, nonlabored ventilation, respiratory function stable and patient connected to nasal cannula oxygen Cardiovascular status: blood pressure returned to baseline and stable Postop Assessment: no apparent nausea or vomiting Anesthetic complications: no   No notable events documented.  Last Vitals:  Vitals:   07/01/21 1330 07/01/21 1400  BP: (!) 86/69 91/73  Pulse: (!) 125 (!) 127  Resp: (!) 25 (!) 22  Temp: 36.7 C   SpO2: 94%     Last Pain:  Vitals:   07/01/21 1330  TempSrc:   PainSc: 0-No pain                 Tiajuana Amass
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
## 2021-07-01 NOTE — Progress Notes (Signed)
OT Cancellation Note  Patient Details Name: ANCELMO HUNT MRN: [id number] DOB: 10-Apr-1961   Cancelled Treatment:    Reason Eval/Treat Not Completed: Patient at procedure or test/ unavailable  [facility] 07/01/2021, 7:24 AM Maurie Boettcher, OT/L   Acute OT Clinical Specialist Acute Rehabilitation Services Pager [phone] Office [phone]

## 2021-07-01 NOTE — Op Note (Signed)
NAMESEQUAN, AUXIER MEDICAL RECORD NO: [id number] ACCOUNT NO: [id number] DATE OF BIRTH: Sep 26, 1961 FACILITY: [facility] LOCATION: [facility] PHYSICIAN: Revonda Standard. Roxan Hockey, MD  Operative Report   DATE OF PROCEDURE: 07/01/2021  PREOPERATIVE DIAGNOSIS:  Right empyema secondary to lung abscess.  POSTOPERATIVE DIAGNOSIS:  Right empyema secondary to lung abscess.  PROCEDURE:  Right video-assisted thoracoscopy, drainage of empyema, decortication.  SURGEON:  Modesto Charon, MD  ASSISTANT:  Lars Pinks, PA.  ANESTHESIA:  General.  FINDINGS:  Approximately 1.5-2 liters of murky fluid.  Dense adhesions to chest wall posteriorly. Ruptured abscess in inferior aspect of right lower lobe. Extensive visceral pleural peel primarily involving the lower lobe.  CLINICAL NOTE:  Mr. Helmes is a 60 year old gentleman who presented with an altered mental status.  He was found to have a right lung abscess and loculated pleural effusion.  He was unable to participate in decision making.  His daughter was advised  that we needed to do a right VATS for drainage of the empyema and decortication to allow the right lung to reexpand.  The indications, risks, benefits, and alternatives were discussed in detail with her.  She accepted the risks on his behalf and agreed  to proceed.  DESCRIPTION OF PROCEDURE:  The patient was brought to the operating room on 07/01/2021.  He had induction of general anesthesia and was intubated with a double lumen endotracheal tube.  Intravenous antibiotics were being given on a scheduled basis.   Sequential compression devices were placed on the calves for DVT prophylaxis.  A Foley catheter was placed.  He was placed in a left lateral decubitus position and the right chest was prepped and draped in the usual sterile fashion.  Single lung  ventilation of the left lung was initiated and was tolerated well throughout the procedure.  A timeout was performed.  An incision was made in  the fifth interspace.  This was approximately 6 cm in length.  No rib spreading was performed during the procedure.  The chest was entered and there was foul smelling, murky fluid encountered.  This was sent for  cultures.  After evacuating a large portion of the fluid, an incision was made in the eighth interspace and a 5 mm port was inserted and the 5 mm thoracoscope was advanced into the chest.  There was an organizing empyema. Superiorly, there were  relatively thin adhesions and once past the fissure, there was a large collection of fluid at the apex, but the upper lobe did not have a significant peel on it. Anteriorly, the middle lobe did not have a significant peel either.  The fluid was evacuated  from around these areas and the lung was mobilized to the level of the hilum.  Posteriorly, near the superior segment of the lower lobe, there were dense adhesions of the lung to the chest wall. Inferiorly, there was more purulent appearing fluid.  The  lung did come off the diaphragm relatively easily, but posteriorly, there were extensive visceral and parietal pleural peels.  The tissue was quite friable and bled easily.  The lower lobe then was decorticated.  This was a slow and tedious process.  The  peel was very thick, particularly posteriorly and laterally, but did come off with minimal tears of the underlying pleura. Part of the pleural peel was also sent for cultures.  The remainder was sent  for pathology.  The vast majority of the peel was able to be removed and the fissure was opened along its length to  make sure that there was no loculated fluid.  A test inflation showed good reexpansion of the lower lobe.  There was an area of ruptured  abscess in the lower lobe with some necrotic tissue around it.  The chest was copiously irrigated with warm saline on multiple occasions. A second incision was made and two 28 Blake drains were placed, one was directed posteriorly and one laterally. Both were  directed  towards the apex.  Dual lung ventilation was resumed.  The incision was closed in standard fashion.  The chest tubes were placed to a Pleur-Evac on suction.  The patient then was extubated in the operating room and taken to the postanesthetic care unit  in good condition.  All sponge, needle and instrument counts were correct at the end of the procedure.   SHW D: 07/01/2021 4:28:17 pm T: 07/01/2021 8:35:00 pm  JOB: [id number]/ [id number]

## 2021-07-02 ENCOUNTER — Encounter (HOSPITAL_COMMUNITY): Payer: Self-pay | Admitting: Thoracic Surgery (Cardiothoracic Vascular Surgery)

## 2021-07-02 ENCOUNTER — Inpatient Hospital Stay (HOSPITAL_COMMUNITY): Payer: Medicare HMO

## 2021-07-02 DIAGNOSIS — N170 Acute kidney failure with tubular necrosis: Secondary | ICD-10-CM | POA: Diagnosis not present

## 2021-07-02 LAB — GLUCOSE, CAPILLARY
Glucose-Capillary: 106 mg/dL — ABNORMAL HIGH (ref 70–99)
Glucose-Capillary: 149 mg/dL — ABNORMAL HIGH (ref 70–99)
Glucose-Capillary: 145 mg/dL — ABNORMAL HIGH (ref 70–99)
Glucose-Capillary: 157 mg/dL — ABNORMAL HIGH (ref 70–99)

## 2021-07-02 LAB — COMPREHENSIVE METABOLIC PANEL
ALT: 79 U/L — ABNORMAL HIGH (ref 0–44)
AST: 62 U/L — ABNORMAL HIGH (ref 15–41)
Albumin: <1.5 g/dL — ABNORMAL LOW (ref 3.5–5.0)
Alkaline Phosphatase: 133 U/L — ABNORMAL HIGH (ref 38–126)
Anion gap: 7 (ref 5–15)
BUN: 8 mg/dL (ref 6–20)
CO2: 20 mmol/L — ABNORMAL LOW (ref 22–32)
Calcium: 7.1 mg/dL — ABNORMAL LOW (ref 8.9–10.3)
Chloride: 111 mmol/L (ref 98–111)
Creatinine, Ser: 0.79 mg/dL (ref 0.61–1.24)
GFR, Estimated: >60 mL/min (ref >60)
Glucose, Bld: 99 mg/dL (ref 70–99)
Potassium: 4.2 mmol/L (ref 3.5–5.1)
Sodium: 138 mmol/L (ref 135–145)
Total Bilirubin: 1.4 mg/dL — ABNORMAL HIGH (ref 0.3–1.2)
Total Protein: 4.5 g/dL — ABNORMAL LOW (ref 6.5–8.1)

## 2021-07-02 LAB — BLOOD GAS, ARTERIAL
Acid-base deficit: 3.4 mmol/L — ABNORMAL HIGH (ref 0.0–2.0)
Bicarbonate: 20.3 mmol/L (ref 20.0–28.0)
Drawn by: 52388
FIO2: 24
O2 Saturation: 98.1 %
Patient temperature: 37
pCO2 arterial: 32.1 mmHg (ref 32.0–48.0)
pH, Arterial: 7.417 (ref 7.350–7.450)
pO2, Arterial: 122 mmHg — ABNORMAL HIGH (ref 83.0–108.0)

## 2021-07-02 LAB — CBC
HCT: 21 % — ABNORMAL LOW (ref 39.0–52.0)
Hemoglobin: 7.2 g/dL — ABNORMAL LOW (ref 13.0–17.0)
MCH: 26.5 pg (ref 26.0–34.0)
MCHC: 34.3 g/dL (ref 30.0–36.0)
MCV: 77.2 fL — ABNORMAL LOW (ref 80.0–100.0)
Platelets: 315 10*3/uL (ref 150–400)
RBC: 2.72 MIL/uL — ABNORMAL LOW (ref 4.22–5.81)
RDW: 18.7 % — ABNORMAL HIGH (ref 11.5–15.5)
WBC: 12.5 10*3/uL — ABNORMAL HIGH (ref 4.0–10.5)
nRBC: 0 % (ref 0.0–0.2)

## 2021-07-02 LAB — CULTURE, BLOOD (ROUTINE X 2)
Culture: NO GROWTH
Culture: NO GROWTH
Special Requests: ADEQUATE
Special Requests: ADEQUATE

## 2021-07-02 LAB — BODY FLUID CULTURE W GRAM STAIN

## 2021-07-02 LAB — BRAIN NATRIURETIC PEPTIDE: B Natriuretic Peptide: 25.2 pg/mL (ref 0.0–100.0)

## 2021-07-02 LAB — MAGNESIUM: Magnesium: 1.7 mg/dL (ref 1.7–2.4)

## 2021-07-02 LAB — PREPARE RBC (CROSSMATCH)

## 2021-07-02 LAB — C-REACTIVE PROTEIN: CRP: 37.9 mg/dL — ABNORMAL HIGH

## 2021-07-02 LAB — PROCALCITONIN: Procalcitonin: 42.02 ng/mL

## 2021-07-02 MED ORDER — ORAL CARE MOUTH RINSE
15.0000 mL | Freq: Two times a day (BID) | OROMUCOSAL | Status: DC
  Administered 2021-07-02 – 2021-07-16 (×14): 15 mL via OROMUCOSAL

## 2021-07-02 MED ORDER — SODIUM CHLORIDE 0.9% IV SOLUTION: Freq: Once | INTRAVENOUS | Status: AC

## 2021-07-02 MED ORDER — FUROSEMIDE 10 MG/ML IJ SOLN: 20.0000 mg | Freq: Once | INTRAMUSCULAR | Status: DC

## 2021-07-02 MED ORDER — CHLORHEXIDINE GLUCONATE CLOTH 2 % EX PADS
6.0000 | MEDICATED_PAD | Freq: Every day | CUTANEOUS | Status: DC
  Administered 2021-07-03 – 2021-07-06 (×4): 6 via TOPICAL
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
## 2021-07-03 ENCOUNTER — Inpatient Hospital Stay (HOSPITAL_COMMUNITY): Payer: Medicare HMO | Admitting: Anesthesiology

## 2021-07-03 ENCOUNTER — Encounter (HOSPITAL_COMMUNITY): Payer: Self-pay | Admitting: Internal Medicine

## 2021-07-03 ENCOUNTER — Encounter (HOSPITAL_COMMUNITY)
Admission: EM | Disposition: A | Discharge status: Skilled Nursing Facility | Payer: Self-pay | Source: Home / Self Care | Attending: Internal Medicine

## 2021-07-03 ENCOUNTER — Inpatient Hospital Stay (HOSPITAL_COMMUNITY): Payer: Medicare HMO

## 2021-07-03 ENCOUNTER — Other Ambulatory Visit: Payer: Self-pay

## 2021-07-03 DIAGNOSIS — J9811 Atelectasis: Secondary | ICD-10-CM

## 2021-07-03 DIAGNOSIS — N170 Acute kidney failure with tubular necrosis: Secondary | ICD-10-CM | POA: Diagnosis not present

## 2021-07-03 HISTORY — PX: VIDEO BRONCHOSCOPY: SHX5072

## 2021-07-03 LAB — GLUCOSE, CAPILLARY
Glucose-Capillary: 107 mg/dL — ABNORMAL HIGH (ref 70–99)
Glucose-Capillary: 109 mg/dL — ABNORMAL HIGH (ref 70–99)
Glucose-Capillary: 97 mg/dL (ref 70–99)
Glucose-Capillary: 110 mg/dL — ABNORMAL HIGH (ref 70–99)
Glucose-Capillary: 166 mg/dL — ABNORMAL HIGH (ref 70–99)

## 2021-07-03 LAB — CBC
HCT: 21.6 % — ABNORMAL LOW (ref 39.0–52.0)
Hemoglobin: 7.7 g/dL — ABNORMAL LOW (ref 13.0–17.0)
MCH: 27.2 pg (ref 26.0–34.0)
MCHC: 35.6 g/dL (ref 30.0–36.0)
MCV: 76.3 fL — ABNORMAL LOW (ref 80.0–100.0)
Platelets: 308 10*3/uL (ref 150–400)
RBC: 2.83 MIL/uL — ABNORMAL LOW (ref 4.22–5.81)
RDW: 18 % — ABNORMAL HIGH (ref 11.5–15.5)
WBC: 13.8 10*3/uL — ABNORMAL HIGH (ref 4.0–10.5)
nRBC: 0.1 % (ref 0.0–0.2)

## 2021-07-03 LAB — COMPREHENSIVE METABOLIC PANEL
ALT: 72 U/L — ABNORMAL HIGH (ref 0–44)
AST: 60 U/L — ABNORMAL HIGH (ref 15–41)
Albumin: <1.5 g/dL — ABNORMAL LOW (ref 3.5–5.0)
Alkaline Phosphatase: 157 U/L — ABNORMAL HIGH (ref 38–126)
Anion gap: 7 (ref 5–15)
BUN: 9 mg/dL (ref 6–20)
CO2: 19 mmol/L — ABNORMAL LOW (ref 22–32)
Calcium: 7.2 mg/dL — ABNORMAL LOW (ref 8.9–10.3)
Chloride: 114 mmol/L — ABNORMAL HIGH (ref 98–111)
Creatinine, Ser: 0.95 mg/dL (ref 0.61–1.24)
GFR, Estimated: >60 mL/min (ref >60)
Glucose, Bld: 148 mg/dL — ABNORMAL HIGH (ref 70–99)
Potassium: 4.1 mmol/L (ref 3.5–5.1)
Sodium: 140 mmol/L (ref 135–145)
Total Bilirubin: 0.7 mg/dL (ref 0.3–1.2)
Total Protein: 4.9 g/dL — ABNORMAL LOW (ref 6.5–8.1)

## 2021-07-03 LAB — PROCALCITONIN: Procalcitonin: 2.11 ng/mL

## 2021-07-03 LAB — AEROBIC/ANAEROBIC CULTURE W GRAM STAIN (SURGICAL/DEEP WOUND)

## 2021-07-03 LAB — BRAIN NATRIURETIC PEPTIDE: B Natriuretic Peptide: 78 pg/mL (ref 0.0–100.0)

## 2021-07-03 LAB — MAGNESIUM: Magnesium: 1.8 mg/dL (ref 1.7–2.4)

## 2021-07-03 LAB — C-REACTIVE PROTEIN: CRP: 38.7 mg/dL — ABNORMAL HIGH (ref <1.0)

## 2021-07-03 LAB — PREPARE RBC (CROSSMATCH)

## 2021-07-03 SURGERY — BRONCHOSCOPY, VIDEO-ASSISTED
Anesthesia: General | Site: Chest

## 2021-07-03 MED ORDER — LACTATED RINGERS IV SOLN: INTRAVENOUS | Status: DC | PRN

## 2021-07-03 MED ORDER — ACETYLCYSTEINE 20 % IN SOLN
4.0000 mL | Freq: Once | RESPIRATORY_TRACT | Status: AC
  Administered 2021-07-03: 4 mL via RESPIRATORY_TRACT
  Filled 2021-07-03: qty 4

## 2021-07-03 MED ORDER — ALBUTEROL SULFATE (2.5 MG/3ML) 0.083% IN NEBU: 2.5000 mg | INHALATION_SOLUTION | RESPIRATORY_TRACT | Status: DC | PRN

## 2021-07-03 MED ORDER — FENTANYL CITRATE (PF) 250 MCG/5ML IJ SOLN
INTRAMUSCULAR | Status: AC
  Filled 2021-07-03: qty 5

## 2021-07-03 MED ORDER — FENTANYL CITRATE (PF) 250 MCG/5ML IJ SOLN
INTRAMUSCULAR | Status: DC | PRN
  Administered 2021-07-03: 100 ug via INTRAVENOUS

## 2021-07-03 MED ORDER — FUROSEMIDE 10 MG/ML IJ SOLN
40.0000 mg | Freq: Once | INTRAMUSCULAR | Status: AC
  Administered 2021-07-03: 40 mg via INTRAVENOUS
  Filled 2021-07-03: qty 4

## 2021-07-03 MED ORDER — LIDOCAINE HCL (PF) 2 % IJ SOLN
INTRAMUSCULAR | Status: AC
  Filled 2021-07-03: qty 5

## 2021-07-03 MED ORDER — TRAMADOL HCL 50 MG PO TABS
50.0000 mg | ORAL_TABLET | Freq: Four times a day (QID) | ORAL | Status: DC | PRN
  Administered 2021-07-14 (×2): 50 mg via ORAL
  Filled 2021-07-03 (×2): qty 1

## 2021-07-03 MED ORDER — OXYCODONE HCL 5 MG PO TABS: 5.0000 mg | ORAL_TABLET | ORAL | Status: DC | PRN

## 2021-07-03 MED ORDER — DEXAMETHASONE SODIUM PHOSPHATE 10 MG/ML IJ SOLN
INTRAMUSCULAR | Status: AC
  Filled 2021-07-03: qty 1

## 2021-07-03 MED ORDER — FENTANYL CITRATE PF 50 MCG/ML IJ SOSY: 25.0000 ug | PREFILLED_SYRINGE | INTRAMUSCULAR | Status: DC | PRN

## 2021-07-03 MED ORDER — LACTATED RINGERS IV SOLN: INTRAVENOUS | Status: DC

## 2021-07-03 MED ORDER — PROPOFOL 10 MG/ML IV BOLUS
INTRAVENOUS | Status: DC | PRN
  Administered 2021-07-03: 150 mg via INTRAVENOUS

## 2021-07-03 MED ORDER — SUGAMMADEX SODIUM 500 MG/5ML IV SOLN
INTRAVENOUS | Status: AC
  Filled 2021-07-03: qty 5

## 2021-07-03 MED ORDER — PHENYLEPHRINE 40 MCG/ML (10ML) SYRINGE FOR IV PUSH (FOR BLOOD PRESSURE SUPPORT)
PREFILLED_SYRINGE | INTRAVENOUS | Status: DC | PRN
Start: 2021-07-03 — End: 2021-07-03
  Administered 2021-07-03 (×2): 200 ug via INTRAVENOUS

## 2021-07-03 MED ORDER — CHLORHEXIDINE GLUCONATE 0.12 % MT SOLN: 15.0000 mL | Freq: Once | OROMUCOSAL | Status: AC

## 2021-07-03 MED ORDER — PROPOFOL 10 MG/ML IV BOLUS
INTRAVENOUS | Status: AC
  Filled 2021-07-03: qty 20

## 2021-07-03 MED ORDER — EPINEPHRINE PF 1 MG/ML IJ SOLN
INTRAMUSCULAR | Status: AC
  Filled 2021-07-03: qty 1

## 2021-07-03 MED ORDER — ORAL CARE MOUTH RINSE: 15.0000 mL | Freq: Once | OROMUCOSAL | Status: AC

## 2021-07-03 MED ORDER — CHLORHEXIDINE GLUCONATE 0.12 % MT SOLN
OROMUCOSAL | Status: AC
  Administered 2021-07-03: 15 mL via OROMUCOSAL
  Filled 2021-07-03: qty 15

## 2021-07-03 MED ORDER — ONDANSETRON HCL 4 MG/2ML IJ SOLN
INTRAMUSCULAR | Status: DC | PRN
  Administered 2021-07-03: 4 mg via INTRAVENOUS

## 2021-07-03 MED ORDER — 0.9 % SODIUM CHLORIDE (POUR BTL) OPTIME
TOPICAL | Status: DC | PRN
  Administered 2021-07-03: 1000 mL

## 2021-07-03 MED ORDER — EPINEPHRINE PF 1 MG/ML IJ SOLN
INTRAMUSCULAR | Status: DC | PRN
  Administered 2021-07-03: 1 mg via ENDOTRACHEOPULMONARY

## 2021-07-03 MED ORDER — ONDANSETRON HCL 4 MG/2ML IJ SOLN
INTRAMUSCULAR | Status: AC
  Filled 2021-07-03: qty 2

## 2021-07-03 MED ORDER — ROCURONIUM BROMIDE 10 MG/ML (PF) SYRINGE
PREFILLED_SYRINGE | INTRAVENOUS | Status: AC
  Filled 2021-07-03: qty 10

## 2021-07-03 MED ORDER — DEXAMETHASONE SODIUM PHOSPHATE 10 MG/ML IJ SOLN
INTRAMUSCULAR | Status: DC | PRN
  Administered 2021-07-03: 4 mg via INTRAVENOUS

## 2021-07-03 MED ORDER — PHENYLEPHRINE HCL-NACL 20-0.9 MG/250ML-% IV SOLN
INTRAVENOUS | Status: DC | PRN
  Administered 2021-07-03: 50 ug/min via INTRAVENOUS

## 2021-07-03 SURGICAL SUPPLY — 34 items
ADAPTER VALVE BIOPSY EBUS (MISCELLANEOUS) IMPLANT
ADPTR VALVE BIOPSY EBUS (MISCELLANEOUS)
BRUSH CYTOL CELLEBRITY 1.5X140 (MISCELLANEOUS) IMPLANT
CANISTER SUCT 3000ML PPV (MISCELLANEOUS) ×2 IMPLANT
CNTNR URN SCR LID CUP LEK RST (MISCELLANEOUS) ×2 IMPLANT
CONT SPEC 4OZ STRL OR WHT (MISCELLANEOUS) ×2
COVER BACK TABLE 60X90IN (DRAPES) ×2 IMPLANT
FILTER STRAW FLUID ASPIR (MISCELLANEOUS) ×1 IMPLANT
FORCEPS BIOP RJ4 1.8 (CUTTING FORCEPS) IMPLANT
FORCEPS RADIAL JAW LRG 4 PULM (INSTRUMENTS) IMPLANT
GAUZE SPONGE 4X4 12PLY STRL (GAUZE/BANDAGES/DRESSINGS) ×2 IMPLANT
GLOVE SURG MICRO LTX SZ6.5 (GLOVE) ×1 IMPLANT
GLOVE SURG SIGNA 7.5 PF LTX (GLOVE) ×3 IMPLANT
GOWN STRL REUS W/ TWL LRG LVL3 (GOWN DISPOSABLE) ×1 IMPLANT
GOWN STRL REUS W/ TWL XL LVL3 (GOWN DISPOSABLE) ×1 IMPLANT
GOWN STRL REUS W/TWL LRG LVL3 (GOWN DISPOSABLE) ×2
GOWN STRL REUS W/TWL XL LVL3 (GOWN DISPOSABLE) ×2
KIT CLEAN ENDO COMPLIANCE (KITS) ×2 IMPLANT
KIT TURNOVER KIT B (KITS) ×2 IMPLANT
MARKER SKIN DUAL TIP RULER LAB (MISCELLANEOUS) ×2 IMPLANT
NS IRRIG 1000ML POUR BTL (IV SOLUTION) ×2 IMPLANT
OIL SILICONE PENTAX (PARTS (SERVICE/REPAIRS)) ×2 IMPLANT
PAD ARMBOARD 7.5X6 YLW CONV (MISCELLANEOUS) ×4 IMPLANT
RADIAL JAW LRG 4 PULMONARY (INSTRUMENTS)
SYR 20ML ECCENTRIC (SYRINGE) ×4 IMPLANT
SYR 5ML LL (SYRINGE) ×2 IMPLANT
SYR 5ML LUER SLIP (SYRINGE) ×2 IMPLANT
TOWEL GREEN STERILE (TOWEL DISPOSABLE) ×2 IMPLANT
TOWEL GREEN STERILE FF (TOWEL DISPOSABLE) ×2 IMPLANT
TRAP SPECIMEN MUCUS 40CC (MISCELLANEOUS) ×2 IMPLANT
TUBE CONNECTING 20X1/4 (TUBING) ×2 IMPLANT
VALVE BIOPSY  SINGLE USE (MISCELLANEOUS) ×2
VALVE BIOPSY SINGLE USE (MISCELLANEOUS) ×1 IMPLANT
VALVE SUCTION BRONCHIO DISP (MISCELLANEOUS) ×2 IMPLANT
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
## 2021-07-03 NOTE — H&P (View-Only) (Signed)
[address]       [place] [address]             [phone]      2 Days Post-Op Procedure(s) (LRB): VIDEO ASSISTED THORACOSCOPY (VATS)/DECORTICATION (Right) Subjective: No events overnight.  Sleeping, awakens slowly and is cooperative.  Night nurse reported he was cooperative and responded appropriately to her overnight.  Tolerating p.o.'s but must be fed.  Bowel movement yesterday  Objective: Vital signs in last 24 hours: Temp:  [98 F (36.7 C)-99.6 F (37.6 C)] 99.6 F (37.6 C) (09/28 2339) Pulse Rate:  [113-124] 113 (09/29 0400) Cardiac Rhythm: Sinus tachycardia (09/29 0735) Resp:  [20-35] 20 (09/29 0400) BP: (90-122)/(66-79) 90/66 (09/29 0400) SpO2:  [93 %-96 %] 95 % (09/29 0400) Weight:  [67.1 kg] 67.1 kg (09/29 0400)    Intake/Output from previous day: 09/28 0701 - 09/29 0700 In: 3468.6 [P.O.:360; I.V.:2506; Blood:304.2; IV Piggyback:298.5] Out: 2240 [Urine:2050; Chest Tube:190] Intake/Output this shift: No intake/output data recorded.  General appearance: not as alert but slowly awakens and is cooperative, and no distress Neurologic: intact Heart: regular rhythm, stable sinus tach on monitor Lungs: breath sounds clear, shallow, weak cough. Chest tube is on -20 suction. No air leak and both lungs appear to be fully expanded on chest x-ray.  Chest tube position is unchanged. CT drainage 190 ml over the past 24 hours.  Drainage in the tube is thin, serous fluid.  Abdomen: soft, NT, active bowel sounds. Wound: the right chest dressing is dry, the chest tubes secure.   Lab Results: Recent Labs    07/02/21 0540 07/03/21 0034  WBC 12.5* 13.8*  HGB 7.2* 7.7*  HCT 21.0* 21.6*  PLT 315 308    BMET:  Recent Labs    07/02/21 0540 07/03/21 0034  NA 138 140  K 4.2 4.1  CL 111 114*  CO2 20* 19*  GLUCOSE 99 148*  BUN 8 9  CREATININE 0.79 0.95  CALCIUM 7.1* 7.2*     PT/INR:  Recent Labs    07/01/21 0445  LABPROT 14.1  INR 1.1     ABG    Component Value Date/Time   PHART 7.417 07/02/2021 0528   HCO3 20.3 07/02/2021 0528   TCO2 26 06/23/2021 1708   ACIDBASEDEF 3.4 (H) 07/02/2021 0528   O2SAT 98.1 07/02/2021 0528   CBG (last 3)  Recent Labs    07/02/21 1645 07/02/21 2123 07/03/21 0631  GLUCAP 145* 157* 107*    CLINICAL DATA:  Evaluate chest tube   EXAM: PORTABLE CHEST 1 VIEW   COMPARISON:  Chest x-ray dated July 01, 2021   FINDINGS: Visualized cardiac and mediastinal contours are unchanged. Small to moderate layering right pleural effusion. Previously seen pneumothorax is no longer apparent. Two right-sided chest tubes are unchanged in position. Increased opacities of the right lower lung, likely due to worsening atelectasis.   IMPRESSION: Minimal right pneumothorax is no longer apparent. Small to moderate layering right pleural effusion similar in size when compared to prior. Unchanged position of right-sided chest tubes.   Increased opacities of the right lower lung, likely due to worsening atelectasis.     Electronically Signed   By: Yetta Glassman M.D.   On: 07/02/2021 09:02  Assessment/Plan: S/P Procedure(s) (LRB): VIDEO ASSISTED THORACOSCOPY (VATS)/DECORTICATION (Right)  -POD-2 right VATS drainage of empyema and decortication precipitated by lung abscess.  No air leak in CT's, drainage is slowing. Transition to water seal today. Gram stain from OR  showing few Gram positive cocci and Gram negative rods.  Cultures from the OR reintubated for better growth.  On Vanc and Zosyn.  Appreciate input from PT and OT.  Needs to work on pulmonary hygiene as able.    -Anemia-Hct 21.0 -> 21.6 after 1unit PRBC's.  No obvious ongoing losses but I/O net positive by 1234ml yesterday.  Transfuse another unit PRBC's today, slow IVF.   -Type 2 DM- Glucose has been well controlled past 24 hours. On CC sliding scale.   -AKI on admission- creat has normalized and is stable, K+ 4.1  -DVT PPX-  on daily enoxaparin.   -Encephalopathy- at baseline mental status per primary team.   LOS: 9 days    Antony Odea, PA-C [phone] 07/03/2021  Patient seen and examined.  Agree with assessment and plan as noted above Reviewing his chest x-ray he still has persistent right lower lobe atelectasis.  He is coughing, but not clearing mucus.  I suspect he has mucous plugging.  Given his inability to cooperate with pulmonary hygiene, I think the best option would be to do a bronchoscopy to clear the mucous plugs.  I discussed bronchoscopy with the patient's daughter Myer Haff.  She understands the indications, risks, and benefits.  She is in agreement with the procedure.  We will plan bronchoscopy to clear mucous plugs this afternoon.  Revonda Standard Roxan Hockey, MD [facility] [phone]

## 2021-07-03 NOTE — Brief Op Note (Signed)
07/03/2021  2:10 PM  PATIENT:  Patrina Levering  60 y.o. male  PRE-OPERATIVE DIAGNOSIS:  MUCOUS PLUGGING  POST-OPERATIVE DIAGNOSIS:  MUCOUS PLUGGING  PROCEDURE:  Procedure(s): VIDEO BRONCHOSCOPY (N/A)  SURGEON:  Surgeon(s) and Role:    * [name], MD - Primary  PHYSICIAN ASSISTANT:   ASSISTANTS: none   ANESTHESIA:   general  EBL:  none   BLOOD ADMINISTERED:none  DRAINS: none   LOCAL MEDICATIONS USED:  NONE  SPECIMEN:  Source of Specimen:  BAL  DISPOSITION OF SPECIMEN:   micro  COUNTS:  NO endo  TOURNIQUET:  * No tourniquets in log *  DICTATION: .Other Dictation: Dictation Number -  PLAN OF CARE: Admit to inpatient   PATIENT DISPOSITION:  PACU - hemodynamically stable.   Delay start of Pharmacological VTE agent (>24hrs) due to surgical blood loss or risk of bleeding: no

## 2021-07-03 NOTE — Progress Notes (Addendum)
[address]       [place] [address]             [phone]      2 Days Post-Op Procedure(s) (LRB): VIDEO ASSISTED THORACOSCOPY (VATS)/DECORTICATION (Right) Subjective: No events overnight.  Sleeping, awakens slowly and is cooperative.  Night nurse reported he was cooperative and responded appropriately to her overnight.  Tolerating p.o.'s but must be fed.  Bowel movement yesterday  Objective: Vital signs in last 24 hours: Temp:  [98 F (36.7 C)-99.6 F (37.6 C)] 99.6 F (37.6 C) (09/28 2339) Pulse Rate:  [113-124] 113 (09/29 0400) Cardiac Rhythm: Sinus tachycardia (09/29 0735) Resp:  [20-35] 20 (09/29 0400) BP: (90-122)/(66-79) 90/66 (09/29 0400) SpO2:  [93 %-96 %] 95 % (09/29 0400) Weight:  [67.1 kg] 67.1 kg (09/29 0400)    Intake/Output from previous day: 09/28 0701 - 09/29 0700 In: 3468.6 [P.O.:360; I.V.:2506; Blood:304.2; IV Piggyback:298.5] Out: 2240 [Urine:2050; Chest Tube:190] Intake/Output this shift: No intake/output data recorded.  General appearance: not as alert but slowly awakens and is cooperative, and no distress Neurologic: intact Heart: regular rhythm, stable sinus tach on monitor Lungs: breath sounds clear, shallow, weak cough. Chest tube is on -20 suction. No air leak and both lungs appear to be fully expanded on chest x-ray.  Chest tube position is unchanged. CT drainage 190 ml over the past 24 hours.  Drainage in the tube is thin, serous fluid.  Abdomen: soft, NT, active bowel sounds. Wound: the right chest dressing is dry, the chest tubes secure.   Lab Results: Recent Labs    07/02/21 0540 07/03/21 0034  WBC 12.5* 13.8*  HGB 7.2* 7.7*  HCT 21.0* 21.6*  PLT 315 308    BMET:  Recent Labs    07/02/21 0540 07/03/21 0034  NA 138 140  K 4.2 4.1  CL 111 114*  CO2 20* 19*  GLUCOSE 99 148*  BUN 8 9  CREATININE 0.79 0.95  CALCIUM 7.1* 7.2*     PT/INR:  Recent Labs    07/01/21 0445  LABPROT 14.1  INR 1.1     ABG    Component Value Date/Time   PHART 7.417 07/02/2021 0528   HCO3 20.3 07/02/2021 0528   TCO2 26 06/23/2021 1708   ACIDBASEDEF 3.4 (H) 07/02/2021 0528   O2SAT 98.1 07/02/2021 0528   CBG (last 3)  Recent Labs    07/02/21 1645 07/02/21 2123 07/03/21 0631  GLUCAP 145* 157* 107*    CLINICAL DATA:  Evaluate chest tube   EXAM: PORTABLE CHEST 1 VIEW   COMPARISON:  Chest x-ray dated July 01, 2021   FINDINGS: Visualized cardiac and mediastinal contours are unchanged. Small to moderate layering right pleural effusion. Previously seen pneumothorax is no longer apparent. Two right-sided chest tubes are unchanged in position. Increased opacities of the right lower lung, likely due to worsening atelectasis.   IMPRESSION: Minimal right pneumothorax is no longer apparent. Small to moderate layering right pleural effusion similar in size when compared to prior. Unchanged position of right-sided chest tubes.   Increased opacities of the right lower lung, likely due to worsening atelectasis.     Electronically Signed   By: Yetta Glassman M.D.   On: 07/02/2021 09:02  Assessment/Plan: S/P Procedure(s) (LRB): VIDEO ASSISTED THORACOSCOPY (VATS)/DECORTICATION (Right)  -POD-2 right VATS drainage of empyema and decortication precipitated by lung abscess.  No air leak in CT's, drainage is slowing. Transition to water seal today. Gram stain from OR  showing few Gram positive cocci and Gram negative rods.  Cultures from the OR reintubated for better growth.  On Vanc and Zosyn.  Appreciate input from PT and OT.  Needs to work on pulmonary hygiene as able.    -Anemia-Hct 21.0 -> 21.6 after 1unit PRBC's.  No obvious ongoing losses but I/O net positive by 1245ml yesterday.  Transfuse another unit PRBC's today, slow IVF.   -Type 2 DM- Glucose has been well controlled past 24 hours. On CC sliding scale.   -AKI on admission- creat has normalized and is stable, K+ 4.1  -DVT PPX-  on daily enoxaparin.   -Encephalopathy- at baseline mental status per primary team.   LOS: 9 days    Antony Odea, PA-C [phone] 07/03/2021  Patient seen and examined.  Agree with assessment and plan as noted above Reviewing his chest x-ray he still has persistent right lower lobe atelectasis.  He is coughing, but not clearing mucus.  I suspect he has mucous plugging.  Given his inability to cooperate with pulmonary hygiene, I think the best option would be to do a bronchoscopy to clear the mucous plugs.  I discussed bronchoscopy with the patient's daughter Myer Haff.  She understands the indications, risks, and benefits.  She is in agreement with the procedure.  We will plan bronchoscopy to clear mucous plugs this afternoon.  Revonda Standard Roxan Hockey, MD [facility] [phone]

## 2021-07-03 NOTE — Transfer of Care (Signed)
Immediate Anesthesia Transfer of Care Note  Patient: Evan Chambers  Procedure(s) Performed: VIDEO BRONCHOSCOPY (Chest)  Patient Location: PACU  Anesthesia Type:General  Level of Consciousness: sedated and patient cooperative  Airway & Oxygen Therapy: Patient Spontanous Breathing and Patient connected to face mask oxygen  Post-op Assessment: Report given to RN and Post -op Vital signs reviewed and stable  Post vital signs: Reviewed  Last Vitals:  Vitals Value Taken Time  BP 125/89 07/03/21 1431  Temp    Pulse 109 07/03/21 1435  Resp 25 07/03/21 1435  SpO2 97 % 07/03/21 1435  Vitals shown include unvalidated device data.  Last Pain:  Vitals:   07/03/21 1247  TempSrc: Oral  PainSc:          Complications: No notable events documented.

## 2021-07-03 NOTE — Progress Notes (Signed)
Dr. Nyoka Cowden with anesthesia notified of patient's heart rate 113. No new orders at this time.

## 2021-07-03 NOTE — Anesthesia Procedure Notes (Signed)
Procedure Name: Intubation Date/Time: 07/03/2021 1:51 PM Performed by: Jenne Campus, CRNA Pre-anesthesia Checklist: Patient identified, Emergency Drugs available, Suction available and Patient being monitored Patient Re-evaluated:Patient Re-evaluated prior to induction Oxygen Delivery Method: Circle System Utilized Preoxygenation: Pre-oxygenation with 100% oxygen Induction Type: IV induction Ventilation: Mask ventilation without difficulty Laryngoscope Size: Miller and 3 Grade View: Grade I Tube type: Oral Tube size: 8.5 mm Number of attempts: 1 Airway Equipment and Method: Stylet and Oral airway Placement Confirmation: ETT inserted through vocal cords under direct vision, positive ETCO2 and breath sounds checked- equal and bilateral Secured at: 21 cm Tube secured with: Tape Dental Injury: Teeth and Oropharynx as per pre-operative assessment

## 2021-07-03 NOTE — Anesthesia Postprocedure Evaluation (Signed)
Anesthesia Post Note  Patient: Evan Chambers  Procedure(s) Performed: VIDEO BRONCHOSCOPY (Chest)     Patient location during evaluation: PACU Anesthesia Type: General Level of consciousness: awake Pain management: pain level controlled Vital Signs Assessment: post-procedure vital signs reviewed and stable Respiratory status: spontaneous breathing Cardiovascular status: stable Postop Assessment: no apparent nausea or vomiting Anesthetic complications: no   No notable events documented.  Last Vitals:  Vitals:   07/03/21 1500 07/03/21 1539  BP: (!) 114/97 117/87  Pulse: (!) 109 (!) 115  Resp: 16 (!) 23  Temp: 36.8 C 36.6 C  SpO2: 94% 93%    Last Pain:  Vitals:   07/03/21 1539  TempSrc: Axillary  PainSc:                  Abdurahman Rugg

## 2021-07-03 NOTE — Anesthesia Preprocedure Evaluation (Addendum)
Anesthesia Evaluation  Patient identified by MRN, date of birth, ID band Patient awake    Reviewed: Allergy & Precautions, NPO status , Patient's Chart, lab work & pertinent test results  Airway Mallampati: II  TM Distance: >3 FB Neck ROM: Full    Dental  (+) Teeth Intact, Dental Advisory Given   Pulmonary neg pulmonary ROS,    breath sounds clear to auscultation       Cardiovascular hypertension,  Rhythm:Regular Rate:Normal     Neuro/Psych negative neurological ROS     GI/Hepatic Neg liver ROS, hiatal hernia,   Endo/Other  diabetes  Renal/GU Renal disease     Musculoskeletal   Abdominal   Peds  Hematology   Anesthesia Other Findings   Reproductive/Obstetrics                            Anesthesia Physical Anesthesia Plan  ASA: 3  Anesthesia Plan: General   Post-op Pain Management:    Induction: Intravenous  PONV Risk Score and Plan: 2 and Ondansetron, Dexamethasone and Midazolam  Airway Management Planned: Oral ETT  Additional Equipment:   Intra-op Plan:   Post-operative Plan: Extubation in OR  Informed Consent: I have reviewed the patients History and Physical, chart, labs and discussed the procedure including the risks, benefits and alternatives for the proposed anesthesia with the patient or authorized representative who has indicated his/her understanding and acceptance.     Dental advisory given  Plan Discussed with: CRNA and Anesthesiologist  Anesthesia Plan Comments:         Anesthesia Quick Evaluation

## 2021-07-03 NOTE — Plan of Care (Signed)
  Problem: Clinical Measurements: Goal: Ability to maintain clinical measurements within normal limits will improve Outcome: Not Progressing Goal: Diagnostic test results will improve Outcome: Not Progressing   Problem: Activity: Goal: Risk for activity intolerance will decrease Outcome: Not Progressing   Problem: Nutrition: Goal: Adequate nutrition will be maintained Outcome: Not Progressing   Problem: Education: Goal: Knowledge of disease or condition will improve Outcome: Not Progressing Goal: Knowledge of the prescribed therapeutic regimen will improve Outcome: Not Progressing   Problem: Activity: Goal: Risk for activity intolerance will decrease Outcome: Not Progressing   Problem: Cardiac: Goal: Will achieve and/or maintain hemodynamic stability Outcome: Not Progressing   Problem: Respiratory: Goal: Respiratory status will improve Outcome: Not Progressing

## 2021-07-03 NOTE — Interval H&P Note (Signed)
History and Physical Interval Note:  07/03/2021 1:18 PM  Evan Chambers  has presented today for surgery, with the diagnosis of MUCOUS PLUGGING.  The various methods of treatment have been discussed with the patient and family. After consideration of risks, benefits and other options for treatment, the patient has consented to  Procedure(s): VIDEO BRONCHOSCOPY (N/A) as a surgical intervention.  The patient's history has been reviewed, patient examined, no change in status, stable for surgery.  I have reviewed the patient's chart and labs.  Questions were answered to the patient's satisfaction.     [name]

## 2021-07-04 ENCOUNTER — Encounter (HOSPITAL_COMMUNITY): Payer: Self-pay | Admitting: Thoracic Surgery (Cardiothoracic Vascular Surgery)

## 2021-07-04 ENCOUNTER — Inpatient Hospital Stay (HOSPITAL_COMMUNITY): Payer: Medicare HMO

## 2021-07-04 DIAGNOSIS — G9341 Metabolic encephalopathy: Secondary | ICD-10-CM | POA: Diagnosis not present

## 2021-07-04 DIAGNOSIS — N170 Acute kidney failure with tubular necrosis: Secondary | ICD-10-CM | POA: Diagnosis not present

## 2021-07-04 LAB — COMPREHENSIVE METABOLIC PANEL
ALT: 71 U/L — ABNORMAL HIGH (ref 0–44)
AST: 67 U/L — ABNORMAL HIGH (ref 15–41)
Albumin: <1.5 g/dL — ABNORMAL LOW (ref 3.5–5.0)
Alkaline Phosphatase: 206 U/L — ABNORMAL HIGH (ref 38–126)
Anion gap: 8 (ref 5–15)
BUN: 10 mg/dL (ref 6–20)
CO2: 22 mmol/L (ref 22–32)
Calcium: 7.6 mg/dL — ABNORMAL LOW (ref 8.9–10.3)
Chloride: 112 mmol/L — ABNORMAL HIGH (ref 98–111)
Creatinine, Ser: 0.73 mg/dL (ref 0.61–1.24)
GFR, Estimated: >60 mL/min (ref >60)
Glucose, Bld: 138 mg/dL — ABNORMAL HIGH (ref 70–99)
Potassium: 4.7 mmol/L (ref 3.5–5.1)
Sodium: 142 mmol/L (ref 135–145)
Total Bilirubin: 0.7 mg/dL (ref 0.3–1.2)
Total Protein: 5.3 g/dL — ABNORMAL LOW (ref 6.5–8.1)

## 2021-07-04 LAB — CBC WITH DIFFERENTIAL/PLATELET
Abs Immature Granulocytes: 0.19 10*3/uL — ABNORMAL HIGH (ref 0.00–0.07)
Basophils Absolute: 0 10*3/uL (ref 0.0–0.1)
Basophils Relative: 0 %
Eosinophils Absolute: 0 10*3/uL (ref 0.0–0.5)
Eosinophils Relative: 0 %
HCT: 27.2 % — ABNORMAL LOW (ref 39.0–52.0)
Hemoglobin: 9.4 g/dL — ABNORMAL LOW (ref 13.0–17.0)
Immature Granulocytes: 2 %
Lymphocytes Relative: 5 %
Lymphs Abs: 0.5 10*3/uL — ABNORMAL LOW (ref 0.7–4.0)
MCH: 27.2 pg (ref 26.0–34.0)
MCHC: 34.6 g/dL (ref 30.0–36.0)
MCV: 78.6 fL — ABNORMAL LOW (ref 80.0–100.0)
Monocytes Absolute: 0.3 10*3/uL (ref 0.1–1.0)
Monocytes Relative: 4 %
Neutro Abs: 8.5 10*3/uL — ABNORMAL HIGH (ref 1.7–7.7)
Neutrophils Relative %: 89 %
Platelets: 349 10*3/uL (ref 150–400)
RBC: 3.46 MIL/uL — ABNORMAL LOW (ref 4.22–5.81)
RDW: 17.6 % — ABNORMAL HIGH (ref 11.5–15.5)
WBC: 9.6 10*3/uL (ref 4.0–10.5)
nRBC: 0 % (ref 0.0–0.2)

## 2021-07-04 LAB — BPAM RBC
Blood Product Expiration Date: 202210022359
Blood Product Expiration Date: 202210272359
Blood Product Expiration Date: 202210292359
ISSUE DATE / TIME: 202209271006
ISSUE DATE / TIME: 202209280959
ISSUE DATE / TIME: 202209290841
Unit Type and Rh: 6200
Unit Type and Rh: 6200
Unit Type and Rh: 6200

## 2021-07-04 LAB — POCT I-STAT, CHEM 8
BUN: 8 mg/dL (ref 6–20)
Calcium, Ion: 1 mmol/L — ABNORMAL LOW (ref 1.15–1.40)
Chloride: 110 mmol/L (ref 98–111)
Creatinine, Ser: 0.6 mg/dL — ABNORMAL LOW (ref 0.61–1.24)
Glucose, Bld: 103 mg/dL — ABNORMAL HIGH (ref 70–99)
HCT: 30 % — ABNORMAL LOW (ref 39.0–52.0)
Hemoglobin: 10.2 g/dL — ABNORMAL LOW (ref 13.0–17.0)
Potassium: 4.1 mmol/L (ref 3.5–5.1)
Sodium: 144 mmol/L (ref 135–145)
TCO2: 23 mmol/L (ref 22–32)

## 2021-07-04 LAB — MAGNESIUM: Magnesium: 1.9 mg/dL (ref 1.7–2.4)

## 2021-07-04 LAB — TYPE AND SCREEN
ABO/RH(D): A POS
Antibody Screen: NEGATIVE
Unit division: 0
Unit division: 0
Unit division: 0

## 2021-07-04 LAB — GLUCOSE, CAPILLARY
Glucose-Capillary: 109 mg/dL — ABNORMAL HIGH (ref 70–99)
Glucose-Capillary: 153 mg/dL — ABNORMAL HIGH (ref 70–99)
Glucose-Capillary: 140 mg/dL — ABNORMAL HIGH (ref 70–99)
Glucose-Capillary: 112 mg/dL — ABNORMAL HIGH (ref 70–99)

## 2021-07-04 LAB — PROCALCITONIN: Procalcitonin: 1.12 ng/mL

## 2021-07-04 LAB — SURGICAL PATHOLOGY

## 2021-07-04 MED ORDER — SODIUM CHLORIDE 0.9 % IV SOLN
2.0000 g | INTRAVENOUS | Status: DC
  Administered 2021-07-04 – 2021-07-06 (×3): 2 g via INTRAVENOUS
  Filled 2021-07-04 (×3): qty 20

## 2021-07-04 MED ORDER — SODIUM CHLORIDE 0.9 % IV SOLN
2.0000 g | INTRAVENOUS | Status: DC
  Filled 2021-07-04: qty 20

## 2021-07-04 MED ORDER — HALOPERIDOL LACTATE 5 MG/ML IJ SOLN
1.0000 mg | Freq: Once | INTRAMUSCULAR | Status: AC
  Administered 2021-07-04: 1 mg via INTRAVENOUS
  Filled 2021-07-04: qty 1

## 2021-07-04 MED ORDER — METRONIDAZOLE 500 MG PO TABS
500.0000 mg | ORAL_TABLET | Freq: Two times a day (BID) | ORAL | Status: DC
  Administered 2021-07-04 – 2021-07-07 (×6): 500 mg via ORAL
  Filled 2021-07-04 (×6): qty 1

## 2021-07-04 MED ORDER — GUAIFENESIN ER 600 MG PO TB12
1200.0000 mg | ORAL_TABLET | Freq: Two times a day (BID) | ORAL | Status: DC
  Administered 2021-07-04 – 2021-07-17 (×25): 1200 mg via ORAL
  Filled 2021-07-04 (×28): qty 2

## 2021-07-04 MED ORDER — POTASSIUM CHLORIDE IN NACL 20-0.9 MEQ/L-% IV SOLN
INTRAVENOUS | Status: DC
  Filled 2021-07-04: qty 1000

## 2021-07-04 MED ORDER — METOPROLOL TARTRATE 25 MG PO TABS
25.0000 mg | ORAL_TABLET | Freq: Two times a day (BID) | ORAL | Status: DC
  Administered 2021-07-04 – 2021-07-09 (×12): 25 mg via ORAL
  Filled 2021-07-04 (×13): qty 1

## 2021-07-04 NOTE — Progress Notes (Signed)
Pharmacy Antibiotic Note  Evan Chambers is a 59 y.o. male admitted on 06/23/2021 with pneumonia.  Pharmacy has been consulted for Vancomycin/Zosyn dosing.  Plan: - Continue Zosyn 3.375 grams IV Q 8 hours - 4 hr infusion - Continue Vancomycin 750 mg IV Q 12 hours (AUC of 490 using Scr of 1) - F/u duration of therapy  Height: 5\' 7"  (170.2 cm) Weight: 65.1 kg (143 lb 8.3 oz) IBW/kg (Calculated) : 66.1  Temp (24hrs), Avg:98.2 F (36.8 C), Min:97.1 F (36.2 C), Max:98.7 F (37.1 C)  Recent Labs  Lab 06/30/21 0231 07/01/21 0020 07/02/21 0540 07/03/21 0034 07/04/21 0033  WBC 13.0* 15.4* 12.5* 13.8* 9.6  CREATININE 0.86 0.87 0.79 0.95 0.73    Estimated Creatinine Clearance: 91.5 mL/min (by C-G formula based on SCr of 0.73 mg/dL).     Antimicrobials this admission: Zosyn 9/26 >>   Vanc  9/27 >>   Microbiology results: 9/25 Pleural fluid cx: gram negative rods, gram positive cocci    Thank you for allowing pharmacy to be a part of this patient's care.  Ardyth Harps, PharmD Clinical Pharmacist

## 2021-07-04 NOTE — Progress Notes (Signed)
Pt down to one IV access.   Pharmacy's Christia Reading confirms that Vancomycin is okay to run Y-site with pts NaCl with KCl 46mm, as it is dilute.   Will place ASAP request for IV team second line.

## 2021-07-04 NOTE — Progress Notes (Addendum)
Patient speaks 4 to 5 word rambling sentences that RN is unable to understand. I ask him to repeat it and he states "I was talking to my brother". I asked where his brother was and he points to behind me. RN reiterates that we are the only two people in the room. I ask patient if he heard someone speaking to him and he does not reply. I asked a second time. No answer. I will confirm with the daughter if pts brother is still living... For peace of mind.   Provider P. Candiss Norse MD notified via secure chat at [phone].
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
## 2021-07-04 NOTE — Progress Notes (Addendum)
[address]       [place] [address]             [phone]      1 Day Post-Op  VIDEO BRONCHOSCOPY  3 Days Post-Op Right VATS, Decortication  Subjective: More awake this morning, cooperating with assessment. Attempts to cough though still weak. Was able to use the flutter valve with assistance.   Objective: Vital signs in last 24 hours: Temp:  [97.1 F (36.2 C)-98.9 F (37.2 C)] 97.6 F (36.4 C) (09/30 0700) Pulse Rate:  [84-115] 95 (09/30 0700) Cardiac Rhythm: Normal sinus rhythm (09/29 2054) Resp:  [15-33] 26 (09/30 0700) BP: (93-133)/(67-97) 133/91 (09/30 0700) SpO2:  [91 %-100 %] 100 % (09/30 0700) Weight:  [65.1 kg-67.1 kg] 65.1 kg (09/30 0341)    Intake/Output from previous day: 09/29 0701 - 09/30 0700 In: 1222.3 [I.V.:754.1; Blood:383.5; IV Piggyback:84.7] Out: 5809 [Urine:4500; Chest Tube:60] Intake/Output this shift: No intake/output data recorded.  General appearance: awake and is cooperative, and no distress Heart: regular rhythm, SR / sinus tach on monitor Lungs: breath sounds clear, shallow, weak cough. Chest tube is on water seal,  No air leak.  Still has RLL ATX on CXR.  Chest tube position is unchanged. CT drainage 60 ml over the past 24 hours.  Drainage in the tube is thin, serous fluid.  Abdomen: soft, NT, active bowel sounds. Wound: the right chest dressing is dry, the chest tubes secure.   Lab Results: Recent Labs    07/03/21 0034 07/04/21 0033  WBC 13.8* 9.6  HGB 7.7* 9.4*  HCT 21.6* 27.2*  PLT 308 349    BMET:  Recent Labs    07/03/21 0034 07/04/21 0033  NA 140 142  K 4.1 4.7  CL 114* 112*  CO2 19* 22  GLUCOSE 148* 138*  BUN 9 10  CREATININE 0.95 0.73  CALCIUM 7.2* 7.6*     PT/INR:  No results for input(s): LABPROT, INR in the last 72 hours.  ABG    Component Value Date/Time   PHART 7.417 07/02/2021 0528   HCO3 20.3 07/02/2021 0528   TCO2 26 06/23/2021 1708   ACIDBASEDEF 3.4 (H) 07/02/2021 0528    O2SAT 98.1 07/02/2021 0528   CBG (last 3)  Recent Labs    07/03/21 1616 07/03/21 2109 07/04/21 0620  GLUCAP 110* 166* 109*     Assessment/Plan: S/P Procedure(s) (LRB): VIDEO BRONCHOSCOPY (N/A)  -POD-3 right VATS drainage of empyema and decortication precipitated by lung abscess.  -POD-1 video bronch to clear mucus plugging No air leak in CT's, and only 61ml drainage past 24 hours.   Gram stain from OR showing few Gram positive cocci and Gram negative rods. 1 of 3 cultures grew strep species.  New cultures sent at time of bronch on 9/29.   On Vanc and Zosyn. WBC is trending down.  Appreciate input from PT and OT.  Continue to work on pulmonary hygiene as able  -Anemia-Hct 27% after transfusing another unit PRBC's yesterday. Minimal CT drainage. Monitor.   -Type 2 DM- Glucose has been well controlled past 24 hours. On CC sliding scale.   -AKI on admission- creat has normalized and is stable, K+ 4.7  -DVT PPX- on daily enoxaparin.   -Encephalopathy- mental status much better this morning. Mgt per primary team.    LOS: 10 days    Antony Odea, PA-C [phone] 07/04/2021 Patient seen and examined, agree with above Much more alert today Eating breakfast  Blood tinged drainage from tubes, will leave in for now [place]. Roxan Hockey, MD [facility] [phone]
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
## 2021-07-04 NOTE — Op Note (Signed)
NAMEGERRARD, CRYSTAL MEDICAL RECORD NO: [id number] ACCOUNT NO: [id number] DATE OF BIRTH: 02/27/1961 FACILITY: [facility] LOCATION: [facility] PHYSICIAN: Revonda Standard. Roxan Hockey, MD  Operative Report   DATE OF PROCEDURE: 07/03/2021  PREOPERATIVE DIAGNOSIS:  Right lower lobe atelectasis, suspect mucus plugging.  POSTOPERATIVE DIAGNOSIS:  Mucus plugging, bronchus intermedius.  PROCEDURE:  Bronchoscopy.  SURGEON:  Revonda Standard. Roxan Hockey, MD  ASSISTANT:  None.  ANESTHESIA:  General.  FINDINGS:  Normal endobronchial anatomy with no endobronchial lesions to the level of the subsegmental bronchi.  Thick inspissated mucus in the bronchus intermedius, cleared with suctioning and irrigation with saline.  CLINICAL NOTE:  The patient is a 60 year old gentleman who presented with a lung abscess, complicated by empyema.  He had undergone drainage of the empyema and decortication.  His chest x-ray showed persistent right lower lobe atelectasis with dense  consolidation.  He was having altered mental status and was unable to clear secretions.  I recommended to his daughter that we do a bronchoscopy in the operating room to clear mucus plugs.  The indications, risks, benefits, and alternatives were  discussed in detail with the patient.  She gave consent.  DESCRIPTION OF PROCEDURE:  Mr. Budai was brought to the operating room on 07/03/2021.  He had induction of general anesthesia and was intubated.  Sequential compression devices were in place.  A timeout was performed.  Flexible fiberoptic bronchoscopy  was performed via the endotracheal tube.  There was a large amount of thick mucus in the bronchus intermedius.  This cleared with irrigation with saline and suctioning. The mucus was sent for cultures.  Bronchoalveolar lavage then was performed of the  right lower and middle lobes.  There was minimal residual mucus in the distal airways.  The entire tracheobronchial tree was inspected.  There was normal  endobronchial anatomy with no endobronchial lesions to the level of the subsegmental bronchi.  The  bronchoscope was removed.  The patient was extubated in the operating room and taken to the postanesthetic care unit in good condition.   [facility] D: 07/04/2021 10:55:24 am T: 07/04/2021 1:53:00 pm  JOB: [id number]/ [id number]
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
## 2021-07-04 NOTE — Plan of Care (Signed)
  Problem: Clinical Measurements: Goal: Diagnostic test results will improve Outcome: Not Progressing   Problem: Nutrition: Goal: Adequate nutrition will be maintained Outcome: Not Progressing   Problem: Education: Goal: Knowledge of disease or condition will improve Outcome: Not Progressing Goal: Knowledge of the prescribed therapeutic regimen will improve Outcome: Not Progressing   Problem: Activity: Goal: Risk for activity intolerance will decrease Outcome: Not Progressing

## 2021-07-05 ENCOUNTER — Inpatient Hospital Stay (HOSPITAL_COMMUNITY): Payer: Medicare HMO

## 2021-07-05 DIAGNOSIS — G9341 Metabolic encephalopathy: Secondary | ICD-10-CM | POA: Diagnosis not present

## 2021-07-05 DIAGNOSIS — N170 Acute kidney failure with tubular necrosis: Secondary | ICD-10-CM | POA: Diagnosis not present

## 2021-07-05 LAB — CBC WITH DIFFERENTIAL/PLATELET
Abs Immature Granulocytes: 0.36 10*3/uL — ABNORMAL HIGH (ref 0.00–0.07)
Basophils Absolute: 0 10*3/uL (ref 0.0–0.1)
Basophils Relative: 0 %
Eosinophils Absolute: 0.1 10*3/uL (ref 0.0–0.5)
Eosinophils Relative: 1 %
HCT: 25.5 % — ABNORMAL LOW (ref 39.0–52.0)
Hemoglobin: 8.7 g/dL — ABNORMAL LOW (ref 13.0–17.0)
Immature Granulocytes: 4 %
Lymphocytes Relative: 12 %
Lymphs Abs: 1.1 10*3/uL (ref 0.7–4.0)
MCH: 27 pg (ref 26.0–34.0)
MCHC: 34.1 g/dL (ref 30.0–36.0)
MCV: 79.2 fL — ABNORMAL LOW (ref 80.0–100.0)
Monocytes Absolute: 0.5 10*3/uL (ref 0.1–1.0)
Monocytes Relative: 6 %
Neutro Abs: 6.5 10*3/uL (ref 1.7–7.7)
Neutrophils Relative %: 77 %
Platelets: 378 10*3/uL (ref 150–400)
RBC: 3.22 MIL/uL — ABNORMAL LOW (ref 4.22–5.81)
RDW: 18.1 % — ABNORMAL HIGH (ref 11.5–15.5)
WBC: 8.5 10*3/uL (ref 4.0–10.5)
nRBC: 0.5 % — ABNORMAL HIGH (ref 0.0–0.2)

## 2021-07-05 LAB — COMPREHENSIVE METABOLIC PANEL
ALT: 73 U/L — ABNORMAL HIGH (ref 0–44)
AST: 82 U/L — ABNORMAL HIGH (ref 15–41)
Albumin: <1.5 g/dL — ABNORMAL LOW (ref 3.5–5.0)
Alkaline Phosphatase: 212 U/L — ABNORMAL HIGH (ref 38–126)
Anion gap: 6 (ref 5–15)
BUN: 13 mg/dL (ref 6–20)
CO2: 22 mmol/L (ref 22–32)
Calcium: 7.5 mg/dL — ABNORMAL LOW (ref 8.9–10.3)
Chloride: 111 mmol/L (ref 98–111)
Creatinine, Ser: 0.81 mg/dL (ref 0.61–1.24)
GFR, Estimated: >60 mL/min (ref >60)
Glucose, Bld: 122 mg/dL — ABNORMAL HIGH (ref 70–99)
Potassium: 4 mmol/L (ref 3.5–5.1)
Sodium: 139 mmol/L (ref 135–145)
Total Bilirubin: 0.7 mg/dL (ref 0.3–1.2)
Total Protein: 5 g/dL — ABNORMAL LOW (ref 6.5–8.1)

## 2021-07-05 LAB — GLUCOSE, CAPILLARY
Glucose-Capillary: 79 mg/dL (ref 70–99)
Glucose-Capillary: 108 mg/dL — ABNORMAL HIGH (ref 70–99)
Glucose-Capillary: 146 mg/dL — ABNORMAL HIGH (ref 70–99)
Glucose-Capillary: 161 mg/dL — ABNORMAL HIGH (ref 70–99)

## 2021-07-05 LAB — MAGNESIUM: Magnesium: 1.9 mg/dL (ref 1.7–2.4)

## 2021-07-05 NOTE — Plan of Care (Signed)
  Problem: Health Behavior/Discharge Planning: Goal: Ability to manage health-related needs will improve Outcome: Not Progressing   Problem: Clinical Measurements: Goal: Ability to maintain clinical measurements within normal limits will improve Outcome: Not Progressing Goal: Will remain free from infection Outcome: Not Progressing Goal: Diagnostic test results will improve Outcome: Not Progressing   Problem: Activity: Goal: Risk for activity intolerance will decrease Outcome: Not Progressing   Problem: Nutrition: Goal: Adequate nutrition will be maintained Outcome: Not Progressing   Problem: Coping: Goal: Level of anxiety will decrease Outcome: Not Progressing   Problem: Elimination: Goal: Will not experience complications related to bowel motility Outcome: Not Progressing Goal: Will not experience complications related to urinary retention Outcome: Not Progressing   Problem: Pain Managment: Goal: General experience of comfort will improve Outcome: Not Progressing   Problem: Safety: Goal: Ability to remain free from injury will improve Outcome: Not Progressing   Problem: Skin Integrity: Goal: Risk for impaired skin integrity will decrease Outcome: Not Progressing   Problem: Education: Goal: Knowledge of disease or condition will improve Outcome: Not Progressing Goal: Knowledge of the prescribed therapeutic regimen will improve Outcome: Not Progressing   Problem: Activity: Goal: Risk for activity intolerance will decrease Outcome: Not Progressing   Problem: Cardiac: Goal: Will achieve and/or maintain hemodynamic stability Outcome: Not Progressing   Problem: Clinical Measurements: Goal: Postoperative complications will be avoided or minimized Outcome: Not Progressing   Problem: Respiratory: Goal: Respiratory status will improve Outcome: Not Progressing   Problem: Pain Management: Goal: Pain level will decrease Outcome: Not Progressing   Problem: Skin  Integrity: Goal: Wound healing without signs and symptoms infection will improve Outcome: Not Progressing

## 2021-07-05 NOTE — Plan of Care (Signed)
  Problem: Coping: Goal: Level of anxiety will decrease Outcome: Progressing   Problem: Safety: Goal: Ability to remain free from injury will improve Outcome: Progressing   Problem: Clinical Measurements: Goal: Postoperative complications will be avoided or minimized Outcome: Progressing

## 2021-07-05 NOTE — Progress Notes (Addendum)
[address]       [place] [address]             [phone]      2 Days Post-Op Procedure(s) (LRB): VIDEO BRONCHOSCOPY (N/A) Subjective: Alert and conversant but confused  Objective: Vital signs in last 24 hours: Temp:  [97.8 F (36.6 C)-98.7 F (37.1 C)] 98.1 F (36.7 C) (10/01 0756) Pulse Rate:  [89-106] 98 (10/01 0756) Cardiac Rhythm: Normal sinus rhythm (10/01 0700) Resp:  [18-32] 26 (10/01 0756) BP: (118-148)/(76-102) 148/98 (10/01 0756) SpO2:  [94 %-97 %] 97 % (10/01 0756) Weight:  [65.4 kg] 65.4 kg (10/01 0505)  Hemodynamic parameters for last 24 hours:    Intake/Output from previous day: 09/30 0701 - 10/01 0700 In: 1240.1 [P.O.:930; I.V.:310.1] Out: 675 [Urine:625; Chest Tube:50] Intake/Output this shift: No intake/output data recorded.  General appearance: alert, cooperative, distracted, and no distress Heart: regular rate and rhythm Lungs: dim right base Abdomen: soft, non tender Extremities: no edema Wound: incis ok  Lab Results: Recent Labs    07/04/21 0033 07/05/21 0041  WBC 9.6 8.5  HGB 9.4* 8.7*  HCT 27.2* 25.5*  PLT 349 378   BMET:  Recent Labs    07/04/21 0033 07/05/21 0041  NA 142 139  K 4.7 4.0  CL 112* 111  CO2 22 22  GLUCOSE 138* 122*  BUN 10 13  CREATININE 0.73 0.81  CALCIUM 7.6* 7.5*    PT/INR: No results for input(s): LABPROT, INR in the last 72 hours. ABG    Component Value Date/Time   PHART 7.417 07/02/2021 0528   HCO3 20.3 07/02/2021 0528   TCO2 23 07/03/2021 1332   ACIDBASEDEF 3.4 (H) 07/02/2021 0528   O2SAT 98.1 07/02/2021 0528   CBG (last 3)  Recent Labs    07/04/21 1613 07/04/21 2126 07/05/21 0632  GLUCAP 140* 112* 79    Meds Scheduled Meds:  bisacodyl  10 mg Oral Daily   Chlorhexidine Gluconate Cloth  6 each Topical Daily   enoxaparin (LOVENOX) injection  40 mg Subcutaneous F63W   folic acid  1 mg Oral Daily   guaiFENesin  1,200 mg Oral BID   insulin aspart  0-9 Units  Subcutaneous TID WC   lactose free nutrition  237 mL Oral TID WC   mouth rinse  15 mL Mouth Rinse BID   metoprolol tartrate  25 mg Oral BID   metroNIDAZOLE  500 mg Oral Q12H   multivitamin with minerals  1 tablet Oral Daily   pantoprazole  40 mg Oral Q1200   senna-docusate  1 tablet Oral QHS   thiamine  500 mg Oral Daily   vitamin B-12  1,000 mcg Oral Daily   Continuous Infusions:  cefTRIAXone (ROCEPHIN)  IV Stopped (07/05/21 0808)   PRN Meds:.acetaminophen **OR** acetaminophen, albuterol, alum & mag hydroxide-simeth, ondansetron (ZOFRAN) IV, traMADol  Xrays DG Chest Port 1 View  Result Date: 07/05/2021 CLINICAL DATA:  Shortness of breath EXAM: PORTABLE CHEST 1 VIEW COMPARISON:  July 04, 2021 FINDINGS: Stable right chest tube. Effusion with underlying opacity on the right is stable. No other interval changes. IMPRESSION: Stable right chest tube. Stable right pleural fluid and underlying opacity/atelectasis. No other changes. Electronically Signed   By: Dorise Bullion III M.D.   On: 07/05/2021 07:06   DG CHEST PORT 1 VIEW  Result Date: 07/04/2021 CLINICAL DATA:  Chest tube, post empyema EXAM: PORTABLE CHEST 1 VIEW COMPARISON:  07/03/2021 FINDINGS: Right chest tubes remain  in place, unchanged. Airspace disease throughout the right lung, most pronounced in the right lower lobe, unchanged. Small right pleural effusion. No pneumothorax. Cardiomegaly. Left base atelectasis or infiltrate unchanged. IMPRESSION: Bilateral airspace disease, right greater than left, unchanged. Right chest tubes in place with small residual right pleural effusion. No pneumothorax. Cardiomegaly. Electronically Signed   By: Rolm Baptise M.D.   On: 07/04/2021 08:25   Results for orders placed or performed during the hospital encounter of 06/23/21  Resp Panel by RT-PCR (Flu A&B, Covid) Nasopharyngeal Swab     Status: None   Collection Time: 06/23/21  7:24 PM   Specimen: Nasopharyngeal Swab; Nasopharyngeal(NP) swabs  in vial transport medium  Result Value Ref Range Status   SARS Coronavirus 2 by RT PCR NEGATIVE NEGATIVE Final    Comment: (NOTE) SARS-CoV-2 target nucleic acids are NOT DETECTED.  The SARS-CoV-2 RNA is generally detectable in upper respiratory specimens during the acute phase of infection. The lowest concentration of SARS-CoV-2 viral copies this assay can detect is 138 copies/mL. A negative result does not preclude SARS-Cov-2 infection and should not be used as the sole basis for treatment or other patient management decisions. A negative result may occur with  improper specimen collection/handling, submission of specimen other than nasopharyngeal swab, presence of viral mutation(s) within the areas targeted by this assay, and inadequate number of viral copies(<138 copies/mL). A negative result must be combined with clinical observations, patient history, and epidemiological information. The expected result is Negative.  Fact Sheet for Patients:  EntrepreneurPulse.com.au  Fact Sheet for Healthcare Providers:  IncredibleEmployment.be  This test is no t yet approved or cleared by the [place] FDA and  has been authorized for detection and/or diagnosis of SARS-CoV-2 by FDA under an Emergency Use Authorization (EUA). This EUA will remain  in effect (meaning this test can be used) for the duration of the COVID-19 declaration under Section 564(b)(1) of the Act, 21 U.S.C.section 360bbb-3(b)(1), unless the authorization is terminated  or revoked sooner.       Influenza A by PCR NEGATIVE NEGATIVE Final   Influenza B by PCR NEGATIVE NEGATIVE Final    Comment: (NOTE) The Xpert Xpress SARS-CoV-2/FLU/RSV plus assay is intended as an aid in the diagnosis of influenza from Nasopharyngeal swab specimens and should not be used as a sole basis for treatment. Nasal washings and aspirates are unacceptable for Xpert Xpress  SARS-CoV-2/FLU/RSV testing.  Fact Sheet for Patients: EntrepreneurPulse.com.au  Fact Sheet for Healthcare Providers: IncredibleEmployment.be  This test is not yet approved or cleared by the [place] FDA and has been authorized for detection and/or diagnosis of SARS-CoV-2 by FDA under an Emergency Use Authorization (EUA). This EUA will remain in effect (meaning this test can be used) for the duration of the COVID-19 declaration under Section 564(b)(1) of the Act, 21 U.S.C. section 360bbb-3(b)(1), unless the authorization is terminated or revoked.  Performed at [facility], [place] [address] [place], [place] [address]   Culture, blood (routine x 2)     Status: None   Collection Time: 06/27/21 12:21 PM   Specimen: BLOOD LEFT ARM  Result Value Ref Range Status   Specimen Description BLOOD LEFT ARM  Final   Special Requests   Final    BOTTLES DRAWN AEROBIC AND ANAEROBIC Blood Culture adequate volume   Culture   Final    NO GROWTH 5 DAYS Performed at [facility], [address], [place], [place] [address]    Report Status 07/02/2021 FINAL  Final  Culture, blood (routine x 2)     Status: None   Collection Time: 06/27/21 12:25 PM   Specimen: BLOOD LEFT HAND  Result Value Ref Range Status   Specimen Description BLOOD LEFT HAND  Final   Special Requests   Final    BOTTLES DRAWN AEROBIC AND ANAEROBIC Blood Culture adequate volume   Culture   Final    NO GROWTH 5 DAYS Performed at [facility], [address] [place], [place] [address]    Report Status 07/02/2021 FINAL  Final  Body fluid culture w Gram Stain     Status: None   Collection Time: 06/29/21 10:58 AM   Specimen: Lung, Right; Pleural Fluid  Result Value Ref Range Status   Specimen Description PLEURAL FLUID  Final   Special Requests LUNG RIGHT  Final   Gram Stain   Final    FEW WBC PRESENT,BOTH PMN AND MONONUCLEAR ABUNDANT GRAM NEGATIVE RODS ABUNDANT GRAM  POSITIVE COCCI Performed at [facility], [address] [place] [address], [place], [place] [address]    Culture   Final    MIXED ANAEROBIC FLORA PRESENT.  CALL LAB IF FURTHER IID REQUIRED.   Report Status 07/02/2021 FINAL  Final  MRSA Next Gen by PCR, Nasal     Status: None   Collection Time: 06/30/21  2:00 PM   Specimen: Nasal Mucosa; Nasal Swab  Result Value Ref Range Status   MRSA by PCR Next Gen NOT DETECTED NOT DETECTED Final    Comment: (NOTE) The GeneXpert MRSA Assay (FDA approved for NASAL specimens only), is one component of a comprehensive MRSA colonization surveillance program. It is not intended to diagnose MRSA infection nor to guide or monitor treatment for MRSA infections. Test performance is not FDA approved in patients less than 62 years old. Performed at [facility], [place] [address] [place], [place] [address]   Aerobic/Anaerobic Culture w Gram Stain (surgical/deep wound)     Status: None   Collection Time: 07/01/21  9:10 AM   Specimen: Pleural, Right; Body Fluid  Result Value Ref Range Status   Specimen Description PLEURAL RIGHT  Final   Special Requests SAMPLE A VANCOMYCIN ZOSYN  Final   Gram Stain   Final    ABUNDANT WBC PRESENT, PREDOMINANTLY PMN ABUNDANT GRAM POSITIVE COCCI IN PAIRS AND CHAINS MODERATE GRAM VARIABLE ROD Performed at [facility], [place] [address] [place], [place] [address]    Culture   Final    MIXED ANAEROBIC FLORA PRESENT.  CALL LAB IF FURTHER IID REQUIRED.   Report Status 07/03/2021 FINAL  Final  Aerobic/Anaerobic Culture w Gram Stain (surgical/deep wound)     Status: None (Preliminary result)   Collection Time: 07/01/21  9:14 AM   Specimen: Pleural, Right; Body Fluid  Result Value Ref Range Status   Specimen Description PLEURAL RIGHT  Final   Special Requests SAMPLE B VANCOMYCIN ZOSYN  Final   Gram Stain   Final    RARE WBC PRESENT,BOTH PMN AND MONONUCLEAR NO ORGANISMS SEEN    Culture   Final    NO GROWTH 3 DAYS NO ANAEROBES  ISOLATED; CULTURE IN PROGRESS FOR 5 DAYS Performed at [facility], [address] [place], [place] [address]    Report Status PENDING  Incomplete  Aerobic/Anaerobic Culture w Gram Stain (surgical/deep wound)     Status: None (Preliminary result)   Collection Time: 07/01/21  9:15 AM   Specimen: Pleural, Right; Body Fluid  Result  Value Ref Range Status   Specimen Description PLEURAL RIGHT  Final   Special Requests SAMPLE C VANCOMYCIN ZOSYN  Final   Gram Stain   Final    RARE WBC PRESENT,BOTH PMN AND MONONUCLEAR NO ORGANISMS SEEN    Culture   Final    CULTURE REINCUBATED FOR BETTER GROWTH Performed at [facility], [address] [place], [place] [address]    Report Status PENDING  Incomplete  Aerobic/Anaerobic Culture w Gram Stain (surgical/deep wound)     Status: None (Preliminary result)   Collection Time: 07/01/21  9:21 AM   Specimen: Pleural, Right; Body Fluid  Result Value Ref Range Status   Specimen Description PLEURAL RIGHT  Final   Special Requests SAMPLE D VANCOMYCIN ZOSYN  Final   Gram Stain   Final    RARE WBC PRESENT,BOTH PMN AND MONONUCLEAR FEW GRAM POSITIVE COCCI RARE GRAM VARIABLE ROD Performed at [facility], [place] [address] [place], [place] [address]    Culture FEW STREPTOCOCCUS SANGUINIS  Final   Report Status PENDING  Incomplete  Culture, Respiratory w Gram Stain     Status: None (Preliminary result)   Collection Time: 07/03/21  2:08 PM   Specimen: Bronchial Wash  Result Value Ref Range Status   Specimen Description BRONCHIAL WASHINGS  Final   Special Requests PT ON VANC,ZOSYN  Final   Gram Stain   Final    NO ORGANISMS SEEN SQUAMOUS EPITHELIAL CELLS PRESENT ABUNDANT WBC PRESENT, PREDOMINANTLY MONONUCLEAR RARE GRAM POSITIVE COCCI    Culture   Final    NO GROWTH < 24 HOURS Performed at [facility], [place] [address], [place], [place] [address]    Report Status PENDING  Incomplete    Assessment/Plan: S/P Procedure(s)  (LRB): VIDEO BRONCHOSCOPY (N/A)  1 afeb, VSS, some s BP in 140's, sinus rhythm 2 sats ok on RA 3 CXR stable in appearance 4 no leukocytosis 5 anemia is slightly worse 6 renal fxn ok 7 on flagyl/rocephin- no organisms but has been on abx 8 CT 50 cc should be able to remove soon 9 delirium improving, baseline early dementia 10 medical management as per hospitalist    LOS: 11 days    John Giovanni 07/05/2021    Chart reviewed, patient examined, agree with above. Chest tube output only 50 cc for 24 hrs. CXR stable. If output remains low today then tube can be removed tomorrow.
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
## 2021-07-05 NOTE — Plan of Care (Signed)
  Problem: Health Behavior/Discharge Planning: Goal: Ability to manage health-related needs will improve Outcome: Not Progressing   Problem: Clinical Measurements: Goal: Ability to maintain clinical measurements within normal limits will improve Outcome: Not Progressing   Problem: Activity: Goal: Risk for activity intolerance will decrease Outcome: Not Progressing   Problem: Education: Goal: Knowledge of disease or condition will improve Outcome: Not Progressing Goal: Knowledge of the prescribed therapeutic regimen will improve Outcome: Not Progressing   Problem: Activity: Goal: Risk for activity intolerance will decrease Outcome: Not Progressing

## 2021-07-06 DIAGNOSIS — G9341 Metabolic encephalopathy: Secondary | ICD-10-CM | POA: Diagnosis not present

## 2021-07-06 DIAGNOSIS — N170 Acute kidney failure with tubular necrosis: Secondary | ICD-10-CM | POA: Diagnosis not present

## 2021-07-06 LAB — AEROBIC/ANAEROBIC CULTURE W GRAM STAIN (SURGICAL/DEEP WOUND): Culture: NO GROWTH

## 2021-07-06 LAB — COMPREHENSIVE METABOLIC PANEL
ALT: 142 U/L — ABNORMAL HIGH (ref 0–44)
AST: 181 U/L — ABNORMAL HIGH (ref 15–41)
Albumin: 1.5 g/dL — ABNORMAL LOW (ref 3.5–5.0)
Alkaline Phosphatase: 227 U/L — ABNORMAL HIGH (ref 38–126)
Anion gap: 8 (ref 5–15)
BUN: 9 mg/dL (ref 6–20)
CO2: 22 mmol/L (ref 22–32)
Calcium: 8 mg/dL — ABNORMAL LOW (ref 8.9–10.3)
Chloride: 107 mmol/L (ref 98–111)
Creatinine, Ser: 0.72 mg/dL (ref 0.61–1.24)
GFR, Estimated: >60 mL/min (ref >60)
Glucose, Bld: 115 mg/dL — ABNORMAL HIGH (ref 70–99)
Potassium: 3.9 mmol/L (ref 3.5–5.1)
Sodium: 137 mmol/L (ref 135–145)
Total Bilirubin: 0.6 mg/dL (ref 0.3–1.2)
Total Protein: 5.7 g/dL — ABNORMAL LOW (ref 6.5–8.1)

## 2021-07-06 LAB — CBC WITH DIFFERENTIAL/PLATELET
Abs Immature Granulocytes: 0.52 10*3/uL — ABNORMAL HIGH (ref 0.00–0.07)
Basophils Absolute: 0.1 10*3/uL (ref 0.0–0.1)
Basophils Relative: 1 %
Eosinophils Absolute: 0.1 10*3/uL (ref 0.0–0.5)
Eosinophils Relative: 1 %
HCT: 30.2 % — ABNORMAL LOW (ref 39.0–52.0)
Hemoglobin: 10.3 g/dL — ABNORMAL LOW (ref 13.0–17.0)
Immature Granulocytes: 5 %
Lymphocytes Relative: 13 %
Lymphs Abs: 1.4 10*3/uL (ref 0.7–4.0)
MCH: 26.8 pg (ref 26.0–34.0)
MCHC: 34.1 g/dL (ref 30.0–36.0)
MCV: 78.4 fL — ABNORMAL LOW (ref 80.0–100.0)
Monocytes Absolute: 0.8 10*3/uL (ref 0.1–1.0)
Monocytes Relative: 7 %
Neutro Abs: 7.9 10*3/uL — ABNORMAL HIGH (ref 1.7–7.7)
Neutrophils Relative %: 73 %
Platelets: 431 10*3/uL — ABNORMAL HIGH (ref 150–400)
RBC: 3.85 MIL/uL — ABNORMAL LOW (ref 4.22–5.81)
RDW: 18.4 % — ABNORMAL HIGH (ref 11.5–15.5)
WBC: 10.7 10*3/uL — ABNORMAL HIGH (ref 4.0–10.5)
nRBC: 0.7 % — ABNORMAL HIGH (ref 0.0–0.2)

## 2021-07-06 LAB — MAGNESIUM: Magnesium: 1.7 mg/dL (ref 1.7–2.4)

## 2021-07-06 LAB — PROTIME-INR
INR: 1 (ref 0.8–1.2)
Prothrombin Time: 12.8 seconds (ref 11.4–15.2)

## 2021-07-06 LAB — GLUCOSE, CAPILLARY
Glucose-Capillary: 109 mg/dL — ABNORMAL HIGH (ref 70–99)
Glucose-Capillary: 145 mg/dL — ABNORMAL HIGH (ref 70–99)
Glucose-Capillary: 107 mg/dL — ABNORMAL HIGH (ref 70–99)
Glucose-Capillary: 130 mg/dL — ABNORMAL HIGH (ref 70–99)
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
## 2021-07-06 NOTE — Progress Notes (Addendum)
[address]       [place] [address]             [phone]      3 Days Post-Op Procedure(s) (LRB): VIDEO BRONCHOSCOPY (N/A) Subjective: More confused this am    Objective: Vital signs in last 24 hours: Temp:  [98.2 F (36.8 C)-98.7 F (37.1 C)] 98.6 F (37 C) (10/02 0801) Pulse Rate:  [100-115] 100 (10/02 0801) Cardiac Rhythm: Sinus tachycardia (10/02 0701) Resp:  [20-28] 22 (10/02 0801) BP: (136-154)/(87-104) 154/104 (10/02 0801) SpO2:  [95 %-98 %] 95 % (10/02 0801) Weight:  [65.6 kg] 65.6 kg (10/02 0449)  Hemodynamic parameters for last 24 hours:    Intake/Output from previous day: 10/01 0701 - 10/02 0700 In: 200 [IV Piggyback:200] Out: 9767 [Urine:3550; Chest Tube:90] Intake/Output this shift: Total I/O In: 240 [P.O.:240] Out: -   General appearance: alert, distracted, and slowed mentation Heart: regular rate and rhythm and tachy Lungs: dim right lower fields Abdomen: soft, non-tender Extremities: no edema.left AKA Wound: dressings CDI  Lab Results: Recent Labs    07/05/21 0041 07/06/21 0038  WBC 8.5 10.7*  HGB 8.7* 10.3*  HCT 25.5* 30.2*  PLT 378 431*   BMET:  Recent Labs    07/05/21 0041 07/06/21 0038  NA 139 137  K 4.0 3.9  CL 111 107  CO2 22 22  GLUCOSE 122* 115*  BUN 13 9  CREATININE 0.81 0.72  CALCIUM 7.5* 8.0*    PT/INR: No results for input(s): LABPROT, INR in the last 72 hours. ABG    Component Value Date/Time   PHART 7.417 07/02/2021 0528   HCO3 20.3 07/02/2021 0528   TCO2 23 07/03/2021 1332   ACIDBASEDEF 3.4 (H) 07/02/2021 0528   O2SAT 98.1 07/02/2021 0528   CBG (last 3)  Recent Labs    07/05/21 1643 07/05/21 2118 07/06/21 0649  GLUCAP 146* 161* 109*    Meds Scheduled Meds:  bisacodyl  10 mg Oral Daily   Chlorhexidine Gluconate Cloth  6 each Topical Daily   enoxaparin (LOVENOX) injection  40 mg Subcutaneous H41P   folic acid  1 mg Oral Daily   guaiFENesin  1,200 mg Oral BID    insulin aspart  0-9 Units Subcutaneous TID WC   lactose free nutrition  237 mL Oral TID WC   mouth rinse  15 mL Mouth Rinse BID   metoprolol tartrate  25 mg Oral BID   metroNIDAZOLE  500 mg Oral Q12H   multivitamin with minerals  1 tablet Oral Daily   pantoprazole  40 mg Oral Q1200   senna-docusate  1 tablet Oral QHS   thiamine  500 mg Oral Daily   vitamin B-12  1,000 mcg Oral Daily   Continuous Infusions:  cefTRIAXone (ROCEPHIN)  IV 2 g (07/05/21 2139)   PRN Meds:.acetaminophen **OR** acetaminophen, albuterol, alum & mag hydroxide-simeth, ondansetron (ZOFRAN) IV, traMADol  Xrays DG Chest Port 1 View  Result Date: 07/05/2021 CLINICAL DATA:  Shortness of breath EXAM: PORTABLE CHEST 1 VIEW COMPARISON:  July 04, 2021 FINDINGS: Stable right chest tube. Effusion with underlying opacity on the right is stable. No other interval changes. IMPRESSION: Stable right chest tube. Stable right pleural fluid and underlying opacity/atelectasis. No other changes. Electronically Signed   By: Dorise Bullion III M.D.   On: 07/05/2021 07:06    Results for orders placed or performed during the hospital encounter of 06/23/21  Resp Panel by RT-PCR (Flu A&B, Covid) Nasopharyngeal  Swab     Status: None   Collection Time: 06/23/21  7:24 PM   Specimen: Nasopharyngeal Swab; Nasopharyngeal(NP) swabs in vial transport medium  Result Value Ref Range Status   SARS Coronavirus 2 by RT PCR NEGATIVE NEGATIVE Final    Comment: (NOTE) SARS-CoV-2 target nucleic acids are NOT DETECTED.  The SARS-CoV-2 RNA is generally detectable in upper respiratory specimens during the acute phase of infection. The lowest concentration of SARS-CoV-2 viral copies this assay can detect is 138 copies/mL. A negative result does not preclude SARS-Cov-2 infection and should not be used as the sole basis for treatment or other patient management decisions. A negative result may occur with  improper specimen collection/handling,  submission of specimen other than nasopharyngeal swab, presence of viral mutation(s) within the areas targeted by this assay, and inadequate number of viral copies(<138 copies/mL). A negative result must be combined with clinical observations, patient history, and epidemiological information. The expected result is Negative.  Fact Sheet for Patients:  EntrepreneurPulse.com.au  Fact Sheet for Healthcare Providers:  IncredibleEmployment.be  This test is no t yet approved or cleared by the [place] FDA and  has been authorized for detection and/or diagnosis of SARS-CoV-2 by FDA under an Emergency Use Authorization (EUA). This EUA will remain  in effect (meaning this test can be used) for the duration of the COVID-19 declaration under Section 564(b)(1) of the Act, 21 U.S.C.section 360bbb-3(b)(1), unless the authorization is terminated  or revoked sooner.       Influenza A by PCR NEGATIVE NEGATIVE Final   Influenza B by PCR NEGATIVE NEGATIVE Final    Comment: (NOTE) The Xpert Xpress SARS-CoV-2/FLU/RSV plus assay is intended as an aid in the diagnosis of influenza from Nasopharyngeal swab specimens and should not be used as a sole basis for treatment. Nasal washings and aspirates are unacceptable for Xpert Xpress SARS-CoV-2/FLU/RSV testing.  Fact Sheet for Patients: EntrepreneurPulse.com.au  Fact Sheet for Healthcare Providers: IncredibleEmployment.be  This test is not yet approved or cleared by the [place] FDA and has been authorized for detection and/or diagnosis of SARS-CoV-2 by FDA under an Emergency Use Authorization (EUA). This EUA will remain in effect (meaning this test can be used) for the duration of the COVID-19 declaration under Section 564(b)(1) of the Act, 21 U.S.C. section 360bbb-3(b)(1), unless the authorization is terminated or revoked.  Performed at [facility], [place] [address], [place], [place] [address]   Culture, blood (routine x 2)     Status: None   Collection Time: 06/27/21 12:21 PM   Specimen: BLOOD LEFT ARM  Result Value Ref Range Status   Specimen Description BLOOD LEFT ARM  Final   Special Requests   Final    BOTTLES DRAWN AEROBIC AND ANAEROBIC Blood Culture adequate volume   Culture   Final    NO GROWTH 5 DAYS Performed at [facility], [address] [place], [place] [address]    Report Status 07/02/2021 FINAL  Final  Culture, blood (routine x 2)     Status: None   Collection Time: 06/27/21 12:25 PM   Specimen: BLOOD LEFT HAND  Result Value Ref Range Status   Specimen Description BLOOD LEFT HAND  Final   Special Requests   Final    BOTTLES DRAWN AEROBIC AND ANAEROBIC Blood Culture adequate volume   Culture   Final    NO GROWTH 5 DAYS Performed at [facility], [place] [address], [place], [place] [address]  Report Status 07/02/2021 FINAL  Final  Body fluid culture w Gram Stain     Status: None   Collection Time: 06/29/21 10:58 AM   Specimen: Lung, Right; Pleural Fluid  Result Value Ref Range Status   Specimen Description PLEURAL FLUID  Final   Special Requests LUNG RIGHT  Final   Gram Stain   Final    FEW WBC PRESENT,BOTH PMN AND MONONUCLEAR ABUNDANT GRAM NEGATIVE RODS ABUNDANT GRAM POSITIVE COCCI Performed at [facility], [address], [place], [place] [address]    Culture   Final    MIXED ANAEROBIC FLORA PRESENT.  CALL LAB IF FURTHER IID REQUIRED.   Report Status 07/02/2021 FINAL  Final  MRSA Next Gen by PCR, Nasal     Status: None   Collection Time: 06/30/21  2:00 PM   Specimen: Nasal Mucosa; Nasal Swab  Result Value Ref Range Status   MRSA by PCR Next Gen NOT DETECTED NOT DETECTED Final    Comment: (NOTE) The GeneXpert MRSA Assay (FDA approved for NASAL specimens only), is one component of a comprehensive MRSA colonization surveillance program. It is not intended to diagnose MRSA infection nor to  guide or monitor treatment for MRSA infections. Test performance is not FDA approved in patients less than 72 years old. Performed at [facility], [place] [address] [place], [place] [address]   Aerobic/Anaerobic Culture w Gram Stain (surgical/deep wound)     Status: None   Collection Time: 07/01/21  9:10 AM   Specimen: Pleural, Right; Body Fluid  Result Value Ref Range Status   Specimen Description PLEURAL RIGHT  Final   Special Requests SAMPLE A VANCOMYCIN ZOSYN  Final   Gram Stain   Final    ABUNDANT WBC PRESENT, PREDOMINANTLY PMN ABUNDANT GRAM POSITIVE COCCI IN PAIRS AND CHAINS MODERATE GRAM VARIABLE ROD Performed at [facility], [place] [address], [place], [place] [address]    Culture   Final    MIXED ANAEROBIC FLORA PRESENT.  CALL LAB IF FURTHER IID REQUIRED.   Report Status 07/03/2021 FINAL  Final  Aerobic/Anaerobic Culture w Gram Stain (surgical/deep wound)     Status: None (Preliminary result)   Collection Time: 07/01/21  9:14 AM   Specimen: Pleural, Right; Body Fluid  Result Value Ref Range Status   Specimen Description PLEURAL RIGHT  Final   Special Requests SAMPLE B VANCOMYCIN ZOSYN  Final   Gram Stain   Final    RARE WBC PRESENT,BOTH PMN AND MONONUCLEAR NO ORGANISMS SEEN    Culture   Final    NO GROWTH 4 DAYS NO ANAEROBES ISOLATED; CULTURE IN PROGRESS FOR 5 DAYS Performed at [facility], [address], [place], [place] [address]    Report Status PENDING  Incomplete  Aerobic/Anaerobic Culture w Gram Stain (surgical/deep wound)     Status: None (Preliminary result)   Collection Time: 07/01/21  9:15 AM   Specimen: Pleural, Right; Body Fluid  Result Value Ref Range Status   Specimen Description PLEURAL RIGHT  Final   Special Requests SAMPLE C VANCOMYCIN ZOSYN  Final   Gram Stain   Final    RARE WBC PRESENT,BOTH PMN AND MONONUCLEAR NO ORGANISMS SEEN Performed at [facility], [address], [place], [place] [address]    Culture   Final     FEW DIPHTHEROIDS(CORYNEBACTERIUM SPECIES) Standardized susceptibility testing for this organism is not available. NO ANAEROBES ISOLATED; CULTURE IN PROGRESS FOR 5 DAYS    Report  Status PENDING  Incomplete  Aerobic/Anaerobic Culture w Gram Stain (surgical/deep wound)     Status: None (Preliminary result)   Collection Time: 07/01/21  9:21 AM   Specimen: Pleural, Right; Body Fluid  Result Value Ref Range Status   Specimen Description PLEURAL RIGHT  Final   Special Requests SAMPLE D VANCOMYCIN ZOSYN  Final   Gram Stain   Final    RARE WBC PRESENT,BOTH PMN AND MONONUCLEAR FEW GRAM POSITIVE COCCI RARE GRAM VARIABLE ROD Performed at [facility], [place] [address], [place], [place] [address]    Culture   Final    FEW STREPTOCOCCUS SANGUINIS FEW DIPHTHEROIDS(CORYNEBACTERIUM SPECIES) Standardized susceptibility testing for this organism is not available. NO ANAEROBES ISOLATED; CULTURE IN PROGRESS FOR 5 DAYS    Report Status PENDING  Incomplete  Anaerobic culture w Gram Stain     Status: None (Preliminary result)   Collection Time: 07/03/21  2:08 PM   Specimen: Bronchial Washing, Right; Respiratory  Result Value Ref Range Status   Specimen Description BRONCHIAL WASHINGS  Final   Special Requests PT ON VANC,ZOSYN  Final   Gram Stain   Final    ABUNDANT WBC PRESENT, PREDOMINANTLY MONONUCLEAR RARE GRAM POSITIVE COCCI Performed at [facility], [place] [address], [place], [place] [address]    Culture   Final    NO ANAEROBES ISOLATED; CULTURE IN PROGRESS FOR 5 DAYS   Report Status PENDING  Incomplete  Culture, Respiratory w Gram Stain     Status: None (Preliminary result)   Collection Time: 07/03/21  2:08 PM   Specimen: Bronchial Wash  Result Value Ref Range Status   Specimen Description BRONCHIAL WASHINGS  Final   Special Requests PT ON VANC,ZOSYN  Final   Gram Stain   Final    NO ORGANISMS SEEN SQUAMOUS EPITHELIAL CELLS PRESENT ABUNDANT WBC PRESENT, PREDOMINANTLY  MONONUCLEAR RARE GRAM POSITIVE COCCI    Culture   Final    RARE YEAST CULTURE REINCUBATED FOR BETTER GROWTH Performed at [facility], [place] [address], [place],  [address]    Report Status PENDING  Incomplete      Assessment/Plan: S/P Procedure(s) (LRB): VIDEO BRONCHOSCOPY (N/A)  1 afebrile, VSS s BP 130's-150's 2 sats good on RA 3 CT - 90 cc/24 hours- remove today 4 anemia is improved 5 on rocephin and flagyl 6 cont medical management per hospitalist 7 renal fxn is normal- good UOP 8 will repeat CXR in am- hopefully d/c tube soon    LOS: 12 days    John Giovanni PA-C Pager [phone] 07/06/2021   Chest tube output low. Will remove the posterior chest tube today and keep the anterior Blake drain. Follow up CXR in am.

## 2021-07-07 ENCOUNTER — Inpatient Hospital Stay (HOSPITAL_COMMUNITY): Payer: Medicare HMO

## 2021-07-07 ENCOUNTER — Other Ambulatory Visit (HOSPITAL_COMMUNITY): Payer: Self-pay

## 2021-07-07 DIAGNOSIS — N170 Acute kidney failure with tubular necrosis: Secondary | ICD-10-CM | POA: Diagnosis not present

## 2021-07-07 DIAGNOSIS — G9341 Metabolic encephalopathy: Secondary | ICD-10-CM | POA: Diagnosis not present

## 2021-07-07 LAB — COMPREHENSIVE METABOLIC PANEL
ALT: 101 U/L — ABNORMAL HIGH (ref 0–44)
AST: 60 U/L — ABNORMAL HIGH (ref 15–41)
Albumin: 1.6 g/dL — ABNORMAL LOW (ref 3.5–5.0)
Alkaline Phosphatase: 207 U/L — ABNORMAL HIGH (ref 38–126)
Anion gap: 7 (ref 5–15)
BUN: 6 mg/dL (ref 6–20)
CO2: 23 mmol/L (ref 22–32)
Calcium: 8.3 mg/dL — ABNORMAL LOW (ref 8.9–10.3)
Chloride: 105 mmol/L (ref 98–111)
Creatinine, Ser: 0.63 mg/dL (ref 0.61–1.24)
GFR, Estimated: >60 mL/min (ref >60)
Glucose, Bld: 129 mg/dL — ABNORMAL HIGH (ref 70–99)
Potassium: 3.9 mmol/L (ref 3.5–5.1)
Sodium: 135 mmol/L (ref 135–145)
Total Bilirubin: 0.8 mg/dL (ref 0.3–1.2)
Total Protein: 6 g/dL — ABNORMAL LOW (ref 6.5–8.1)

## 2021-07-07 LAB — CBC WITH DIFFERENTIAL/PLATELET
Abs Immature Granulocytes: 0.52 10*3/uL — ABNORMAL HIGH (ref 0.00–0.07)
Basophils Absolute: 0.1 10*3/uL (ref 0.0–0.1)
Basophils Relative: 1 %
Eosinophils Absolute: 0.1 10*3/uL (ref 0.0–0.5)
Eosinophils Relative: 1 %
HCT: 33.1 % — ABNORMAL LOW (ref 39.0–52.0)
Hemoglobin: 11.2 g/dL — ABNORMAL LOW (ref 13.0–17.0)
Immature Granulocytes: 5 %
Lymphocytes Relative: 10 %
Lymphs Abs: 1.2 10*3/uL (ref 0.7–4.0)
MCH: 26.9 pg (ref 26.0–34.0)
MCHC: 33.8 g/dL (ref 30.0–36.0)
MCV: 79.4 fL — ABNORMAL LOW (ref 80.0–100.0)
Monocytes Absolute: 0.7 10*3/uL (ref 0.1–1.0)
Monocytes Relative: 6 %
Neutro Abs: 9 10*3/uL — ABNORMAL HIGH (ref 1.7–7.7)
Neutrophils Relative %: 77 %
Platelets: 497 10*3/uL — ABNORMAL HIGH (ref 150–400)
RBC: 4.17 MIL/uL — ABNORMAL LOW (ref 4.22–5.81)
RDW: 18.3 % — ABNORMAL HIGH (ref 11.5–15.5)
WBC: 11.6 10*3/uL — ABNORMAL HIGH (ref 4.0–10.5)
nRBC: 0.4 % — ABNORMAL HIGH (ref 0.0–0.2)

## 2021-07-07 LAB — PROTIME-INR
INR: 1.1 (ref 0.8–1.2)
Prothrombin Time: 13.7 seconds (ref 11.4–15.2)

## 2021-07-07 LAB — CULTURE, RESPIRATORY W GRAM STAIN

## 2021-07-07 LAB — GLUCOSE, CAPILLARY
Glucose-Capillary: 109 mg/dL — ABNORMAL HIGH (ref 70–99)
Glucose-Capillary: 123 mg/dL — ABNORMAL HIGH (ref 70–99)
Glucose-Capillary: 112 mg/dL — ABNORMAL HIGH (ref 70–99)
Glucose-Capillary: 117 mg/dL — ABNORMAL HIGH (ref 70–99)

## 2021-07-07 LAB — MAGNESIUM: Magnesium: 1.8 mg/dL (ref 1.7–2.4)

## 2021-07-07 MED ORDER — TAMSULOSIN HCL 0.4 MG PO CAPS
0.4000 mg | ORAL_CAPSULE | Freq: Every day | ORAL | Status: DC
  Administered 2021-07-07 – 2021-07-17 (×10): 0.4 mg via ORAL
  Filled 2021-07-07 (×11): qty 1

## 2021-07-07 MED ORDER — AMOXICILLIN-POT CLAVULANATE 875-125 MG PO TABS
1.0000 | ORAL_TABLET | Freq: Two times a day (BID) | ORAL | Status: DC
  Administered 2021-07-07 – 2021-07-09 (×6): 1 via ORAL
  Filled 2021-07-07 (×7): qty 1

## 2021-07-07 MED ORDER — LABETALOL HCL 5 MG/ML IV SOLN: 10.0000 mg | INTRAVENOUS | Status: DC | PRN

## 2021-07-07 MED ORDER — LINEZOLID 600 MG PO TABS
600.0000 mg | ORAL_TABLET | Freq: Two times a day (BID) | ORAL | Status: DC
  Administered 2021-07-07 – 2021-07-09 (×6): 600 mg via ORAL
  Filled 2021-07-07 (×8): qty 1

## 2021-07-07 NOTE — TOC Benefit Eligibility Note (Signed)
Patient Advocate Encounter   Received notification that prior authorization for Linezolid 600 mg tab is required.   PA submitted on 07/07/2021 Key [id number] Status is pending       Lyndel Safe, CPhT Pharmacy Patient Advocate Specialist [facility] Antimicrobial Stewardship Team Direct Number: [phone]  Fax: [phone]

## 2021-07-07 NOTE — Plan of Care (Signed)
  Problem: Health Behavior/Discharge Planning: Goal: Ability to manage health-related needs will improve Outcome: Progressing   Problem: Clinical Measurements: Goal: Ability to maintain clinical measurements within normal limits will improve Outcome: Progressing Goal: Will remain free from infection Outcome: Progressing Goal: Diagnostic test results will improve Outcome: Progressing   Problem: Activity: Goal: Risk for activity intolerance will decrease Outcome: Progressing   Problem: Nutrition: Goal: Adequate nutrition will be maintained Outcome: Progressing   Problem: Coping: Goal: Level of anxiety will decrease Outcome: Progressing   Problem: Elimination: Goal: Will not experience complications related to bowel motility Outcome: Progressing Goal: Will not experience complications related to urinary retention Outcome: Progressing   Problem: Pain Managment: Goal: General experience of comfort will improve Outcome: Progressing   Problem: Safety: Goal: Ability to remain free from injury will improve Outcome: Progressing   Problem: Skin Integrity: Goal: Risk for impaired skin integrity will decrease Outcome: Progressing   Problem: Education: Goal: Knowledge of disease or condition will improve Outcome: Progressing Goal: Knowledge of the prescribed therapeutic regimen will improve Outcome: Progressing   Problem: Activity: Goal: Risk for activity intolerance will decrease Outcome: Progressing   Problem: Cardiac: Goal: Will achieve and/or maintain hemodynamic stability Outcome: Progressing   Problem: Clinical Measurements: Goal: Postoperative complications will be avoided or minimized Outcome: Progressing   Problem: Respiratory: Goal: Respiratory status will improve Outcome: Progressing   Problem: Pain Management: Goal: Pain level will decrease Outcome: Progressing   Problem: Skin Integrity: Goal: Wound healing without signs and symptoms infection will  improve Outcome: Progressing

## 2021-07-07 NOTE — TOC Progression Note (Signed)
Transition of Care [facility]) - Progression Note    Patient Details  Name: Evan Chambers MRN: [id number] Date of Birth: Mar 16, 1961  [facility] CM/SW [place], [place] Phone Number: 07/07/2021, 2:06 PM  Clinical Narrative:    CSW attempted to contact pt mother and pt with no answer from either. CSW then went to pt room, pt was asleep and Nurse tech stated pt was not fully oriented to check with pt emergency contact.   Pt had previous plans to DC to [facility], CSW checked on a bed. There is an available bed, CSW will attempt to contact pt mother to confirm bed at [facility] [facility] as SNF placement.    Expected Discharge Plan: Skilled Nursing Facility Barriers to Discharge: Active Substance Use - Placement, Insurance Authorization  Expected Discharge Plan and Services Expected Discharge Plan: [place] In-house Referral: Clinical Social Work Discharge Planning Services: NA Post Acute Care Choice: [place] Living arrangements for the past 2 months: Single Family Home                                       Social Determinants of Health (SDOH) Interventions    Readmission Risk Interventions No flowsheet data found.
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
## 2021-07-07 NOTE — Progress Notes (Signed)
8:30 am Chest tube removed without complication, site is clean dry and intact. Patient resting comfortably in bed at this time.   Chest x-ray already ordered post chest tube removal.

## 2021-07-07 NOTE — TOC Benefit Eligibility Note (Signed)
Patient Advocate Encounter  Prior Authorization for Linezolid 600 mg tab has been approved.    PA# [id number] Effective dates: 07/07/2021 through 08/04/2021  Patients co-pay is $3.95.     Lyndel Safe, [place] Patient Advocate Specialist [facility] Health Antimicrobial Stewardship Team Direct Number: [phone]  Fax: [phone]

## 2021-07-07 NOTE — Progress Notes (Addendum)
      [address]       [place] [address]             [phone]       4 Days Post-Op Procedure(s) (LRB): VIDEO BRONCHOSCOPY (N/A)  Subjective: Patient sleeping, awakened. When asked if has pain he shook his head no.  Objective: Vital signs in last 24 hours: Temp:  [98 F (36.7 C)-99.1 F (37.3 C)] 98.3 F (36.8 C) (10/03 0419) Pulse Rate:  [95-117] 95 (10/03 0419) Cardiac Rhythm: Normal sinus rhythm (10/03 0419) Resp:  [15-25] 19 (10/03 0419) BP: (120-154)/(90-104) 120/90 (10/03 0419) SpO2:  [95 %-98 %] 98 % (10/03 0419) Weight:  [62.4 kg] 62.4 kg (10/03 0420)     Intake/Output from previous day: 10/02 0701 - 10/03 0700 In: 580 [P.O.:480; IV Piggyback:100] Out: 1750 [Urine:1700; Chest Tube:50]   Physical Exam:  Cardiovascular: Slightly tachycardic Pulmonary: Clear to auscultation on left and slightly diminished right basilar breath sounds Extremities: No RLE edema Wounds: Clean and dry.  No erythema or signs of infection. Chest Tube: to water seal, no air leak  Lab Results: CBC: Recent Labs    07/06/21 0038 07/07/21 0040  WBC 10.7* 11.6*  HGB 10.3* 11.2*  HCT 30.2* 33.1*  PLT 431* 497*   BMET:  Recent Labs    07/06/21 0038 07/07/21 0040  NA 137 135  K 3.9 3.9  CL 107 105  CO2 22 23  GLUCOSE 115* 129*  BUN 9 6  CREATININE 0.72 0.63  CALCIUM 8.0* 8.3*    PT/INR:  Recent Labs    07/07/21 0040  LABPROT 13.7  INR 1.1   ABG:  INR: Will add last result for INR, ABG once components are confirmed Will add last 4 CBG results once components are confirmed  Assessment/Plan:  1. CV - Slightly tachy this am. On Lopressor 25 mg bid. 2.  Pulmonary - On room air. Right chest tube with 50 cc for 12 (last 12 hours not recorded). Chest tube is to water seal, no air leak. CXR this am appear stable (improving right opacity). Hope to remove chest tube soon. Encourage incentive spirometer 3. DM-CBGs 107/130/109. On Insulin. Will  restart Metformin closer to discharge. Pre op HGA1C 6. 4. ID-on Ceftriaxone and Metronidazole for right empyema. Right pleural culture showed a few Strep Sanguinis and Diphtheroids 5. Expected post op blood loss anemia-H and H this am increased to 11.2 and 33.1  Donielle M ZimmermanPA-C 07/07/2021,6:55 AM [phone]   CXR looks much better at right base Minimal drainage form CT- will dc  Will need long course of antibiotics  Remo Lipps C. Roxan Hockey, MD [facility] [phone]

## 2021-07-08 ENCOUNTER — Inpatient Hospital Stay (HOSPITAL_COMMUNITY): Payer: Medicare HMO

## 2021-07-08 DIAGNOSIS — N170 Acute kidney failure with tubular necrosis: Secondary | ICD-10-CM | POA: Diagnosis not present

## 2021-07-08 DIAGNOSIS — G9341 Metabolic encephalopathy: Secondary | ICD-10-CM | POA: Diagnosis not present

## 2021-07-08 DIAGNOSIS — E440 Moderate protein-calorie malnutrition: Secondary | ICD-10-CM | POA: Insufficient documentation

## 2021-07-08 LAB — CBC WITH DIFFERENTIAL/PLATELET
Abs Immature Granulocytes: 0.47 10*3/uL — ABNORMAL HIGH (ref 0.00–0.07)
Basophils Absolute: 0 10*3/uL (ref 0.0–0.1)
Basophils Relative: 0 %
Eosinophils Absolute: 0.1 10*3/uL (ref 0.0–0.5)
Eosinophils Relative: 1 %
HCT: 31.7 % — ABNORMAL LOW (ref 39.0–52.0)
Hemoglobin: 10.7 g/dL — ABNORMAL LOW (ref 13.0–17.0)
Immature Granulocytes: 4 %
Lymphocytes Relative: 9 %
Lymphs Abs: 1 10*3/uL (ref 0.7–4.0)
MCH: 26.8 pg (ref 26.0–34.0)
MCHC: 33.8 g/dL (ref 30.0–36.0)
MCV: 79.4 fL — ABNORMAL LOW (ref 80.0–100.0)
Monocytes Absolute: 0.7 10*3/uL (ref 0.1–1.0)
Monocytes Relative: 6 %
Neutro Abs: 9 10*3/uL — ABNORMAL HIGH (ref 1.7–7.7)
Neutrophils Relative %: 80 %
Platelets: 492 10*3/uL — ABNORMAL HIGH (ref 150–400)
RBC: 3.99 MIL/uL — ABNORMAL LOW (ref 4.22–5.81)
RDW: 18.3 % — ABNORMAL HIGH (ref 11.5–15.5)
WBC: 11.3 10*3/uL — ABNORMAL HIGH (ref 4.0–10.5)
nRBC: 0 % (ref 0.0–0.2)

## 2021-07-08 LAB — ANAEROBIC CULTURE W GRAM STAIN

## 2021-07-08 LAB — COMPREHENSIVE METABOLIC PANEL
ALT: 60 U/L — ABNORMAL HIGH (ref 0–44)
AST: 23 U/L (ref 15–41)
Albumin: 1.5 g/dL — ABNORMAL LOW (ref 3.5–5.0)
Alkaline Phosphatase: 163 U/L — ABNORMAL HIGH (ref 38–126)
Anion gap: 10 (ref 5–15)
BUN: 8 mg/dL (ref 6–20)
CO2: 22 mmol/L (ref 22–32)
Calcium: 8.2 mg/dL — ABNORMAL LOW (ref 8.9–10.3)
Chloride: 103 mmol/L (ref 98–111)
Creatinine, Ser: 0.62 mg/dL (ref 0.61–1.24)
GFR, Estimated: >60 mL/min (ref >60)
Glucose, Bld: 120 mg/dL — ABNORMAL HIGH (ref 70–99)
Potassium: 3 mmol/L — ABNORMAL LOW (ref 3.5–5.1)
Sodium: 135 mmol/L (ref 135–145)
Total Bilirubin: 0.8 mg/dL (ref 0.3–1.2)
Total Protein: 5.8 g/dL — ABNORMAL LOW (ref 6.5–8.1)

## 2021-07-08 LAB — GLUCOSE, CAPILLARY
Glucose-Capillary: 135 mg/dL — ABNORMAL HIGH (ref 70–99)
Glucose-Capillary: 100 mg/dL — ABNORMAL HIGH (ref 70–99)
Glucose-Capillary: 116 mg/dL — ABNORMAL HIGH (ref 70–99)
Glucose-Capillary: 137 mg/dL — ABNORMAL HIGH (ref 70–99)

## 2021-07-08 LAB — MAGNESIUM: Magnesium: 1.7 mg/dL (ref 1.7–2.4)

## 2021-07-08 LAB — PROTIME-INR
INR: 1.1 (ref 0.8–1.2)
Prothrombin Time: 13.8 seconds (ref 11.4–15.2)

## 2021-07-08 MED ORDER — LACTATED RINGERS IV SOLN: INTRAVENOUS | Status: AC

## 2021-07-08 MED ORDER — POTASSIUM CHLORIDE CRYS ER 20 MEQ PO TBCR
30.0000 meq | EXTENDED_RELEASE_TABLET | Freq: Three times a day (TID) | ORAL | Status: AC
  Administered 2021-07-08 (×3): 30 meq via ORAL
  Filled 2021-07-08 (×3): qty 1

## 2021-07-08 MED ORDER — MAGNESIUM SULFATE IN D5W 1-5 GM/100ML-% IV SOLN
1.0000 g | Freq: Once | INTRAVENOUS | Status: AC
  Administered 2021-07-08: 1 g via INTRAVENOUS
  Filled 2021-07-08: qty 100

## 2021-07-08 NOTE — Progress Notes (Addendum)
      [address]       [place] [address]             [phone]       5 Days Post-Op Procedure(s) (LRB): VIDEO BRONCHOSCOPY (N/A)  Subjective: When asked if breathing is ok, patient stated yes.  Objective: Vital signs in last 24 hours: Temp:  [97.8 F (36.6 C)-98.7 F (37.1 C)] 98.7 F (37.1 C) (10/04 0436) Pulse Rate:  [100-103] 103 (10/04 0010) Cardiac Rhythm: Sinus tachycardia (10/03 1908) Resp:  [12-20] 20 (10/04 0436) BP: (111-136)/(84-94) 111/85 (10/04 0010) SpO2:  [96 %-99 %] 99 % (10/04 0010)     Intake/Output from previous day: 10/03 0701 - 10/04 0700 In: 474 [P.O.:474] Out: 850 [Urine:850]   Physical Exam:  Cardiovascular: Slightly tachycardic Pulmonary: Clear to auscultation on left and slightly diminished right basilar breath sounds Extremities: No RLE edema Wounds: Clean and dry.  No erythema or signs of infection. Mild serous drainage from posterior chest tube wound.   Lab Results: CBC: Recent Labs    07/07/21 0040 07/08/21 0034  WBC 11.6* 11.3*  HGB 11.2* 10.7*  HCT 33.1* 31.7*  PLT 497* 492*    BMET:  Recent Labs    07/07/21 0040 07/08/21 0034  NA 135 135  K 3.9 3.0*  CL 105 103  CO2 23 22  GLUCOSE 129* 120*  BUN 6 8  CREATININE 0.63 0.62  CALCIUM 8.3* 8.2*     PT/INR:  Recent Labs    07/08/21 0034  LABPROT 13.8  INR 1.1    ABG:  INR: Will add last result for INR, ABG once components are confirmed Will add last 4 CBG results once components are confirmed  Assessment/Plan:  1. CV - Slightly tachy at times. On Lopressor 25 mg bid. 2.  Pulmonary - On room air. CXR this am appear stable (improving right opacity, no pneumothorax). Encourage incentive spirometer 3. DM-CBGs 112/117/135. On Insulin. Will restart Metformin closer to discharge. Pre op HGA1C 6. 4. ID-Now on Augmentin and Linezolid for right empyema. Right pleural culture showed Strep Sanguinis and Diphtheroids 5. Expected post op blood  loss anemia-H and H this am i10.7 and 31.7 6. Supplement potassium 7. Follow up appointment arranged  Donielle M ZimmermanPA-C 07/08/2021,6:55 AM [phone]   Patient seen and examined. Agree with above CXR looks good Plan per primary team  Remo Lipps C. Roxan Hockey, MD [facility] [phone]

## 2021-07-08 NOTE — TOC Progression Note (Signed)
Transition of Care [facility]) - Progression Note    Patient Details  Name: Evan Chambers MRN: [id number] Date of Birth: 12/10/1960  Transition of Care [facility]) CM/SW [place], [place] Phone Number: 07/08/2021, 12:09 PM  Clinical Narrative:    CSW spoke with pt daughter to confirm placement for rehab. Pt daughter confirmed that pt will DC to [facility], CSW shared that pt insurance has been started but pt is not medically ready for DC. CSW will follow up on auth with the facility when pt is medically stable.    Expected Discharge Plan: Skilled Nursing Facility Barriers to Discharge: Active Substance Use - Placement, Insurance Authorization  Expected Discharge Plan and Services Expected Discharge Plan: [place] In-house Referral: Clinical Social Work Discharge Planning Services: NA Post Acute Care Choice: [place] Living arrangements for the past 2 months: Single Family Home                                       Social Determinants of Health (SDOH) Interventions    Readmission Risk Interventions No flowsheet data found.
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
## 2021-07-08 NOTE — Plan of Care (Signed)
  Problem: Health Behavior/Discharge Planning: Goal: Ability to manage health-related needs will improve Outcome: Progressing   Problem: Clinical Measurements: Goal: Ability to maintain clinical measurements within normal limits will improve Outcome: Progressing Goal: Will remain free from infection Outcome: Progressing Goal: Diagnostic test results will improve Outcome: Progressing   Problem: Activity: Goal: Risk for activity intolerance will decrease Outcome: Progressing   Problem: Nutrition: Goal: Adequate nutrition will be maintained Outcome: Progressing   Problem: Coping: Goal: Level of anxiety will decrease Outcome: Progressing   Problem: Elimination: Goal: Will not experience complications related to bowel motility Outcome: Progressing Goal: Will not experience complications related to urinary retention Outcome: Progressing   Problem: Pain Managment: Goal: General experience of comfort will improve Outcome: Progressing   Problem: Safety: Goal: Ability to remain free from injury will improve Outcome: Progressing   Problem: Skin Integrity: Goal: Risk for impaired skin integrity will decrease Outcome: Progressing   Problem: Education: Goal: Knowledge of disease or condition will improve Outcome: Progressing Goal: Knowledge of the prescribed therapeutic regimen will improve Outcome: Progressing   Problem: Activity: Goal: Risk for activity intolerance will decrease Outcome: Progressing   Problem: Cardiac: Goal: Will achieve and/or maintain hemodynamic stability Outcome: Progressing   Problem: Clinical Measurements: Goal: Postoperative complications will be avoided or minimized Outcome: Progressing   Problem: Respiratory: Goal: Respiratory status will improve Outcome: Progressing   Problem: Pain Management: Goal: Pain level will decrease Outcome: Progressing   Problem: Skin Integrity: Goal: Wound healing without signs and symptoms infection will  improve Outcome: Progressing
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
## 2021-07-09 DIAGNOSIS — N170 Acute kidney failure with tubular necrosis: Secondary | ICD-10-CM | POA: Diagnosis not present

## 2021-07-09 LAB — COMPREHENSIVE METABOLIC PANEL
ALT: 39 U/L (ref 0–44)
AST: 19 U/L (ref 15–41)
Albumin: 1.6 g/dL — ABNORMAL LOW (ref 3.5–5.0)
Alkaline Phosphatase: 134 U/L — ABNORMAL HIGH (ref 38–126)
Anion gap: 8 (ref 5–15)
BUN: 10 mg/dL (ref 6–20)
CO2: 20 mmol/L — ABNORMAL LOW (ref 22–32)
Calcium: 7.7 mg/dL — ABNORMAL LOW (ref 8.9–10.3)
Chloride: 106 mmol/L (ref 98–111)
Creatinine, Ser: 0.71 mg/dL (ref 0.61–1.24)
GFR, Estimated: >60 mL/min (ref >60)
Glucose, Bld: 122 mg/dL — ABNORMAL HIGH (ref 70–99)
Potassium: 3.8 mmol/L (ref 3.5–5.1)
Sodium: 134 mmol/L — ABNORMAL LOW (ref 135–145)
Total Bilirubin: 1 mg/dL (ref 0.3–1.2)
Total Protein: 5.6 g/dL — ABNORMAL LOW (ref 6.5–8.1)

## 2021-07-09 LAB — CBC WITH DIFFERENTIAL/PLATELET
Abs Immature Granulocytes: 0.3 10*3/uL — ABNORMAL HIGH (ref 0.00–0.07)
Basophils Absolute: 0.1 10*3/uL (ref 0.0–0.1)
Basophils Relative: 1 %
Eosinophils Absolute: 0.1 10*3/uL (ref 0.0–0.5)
Eosinophils Relative: 1 %
HCT: 30.6 % — ABNORMAL LOW (ref 39.0–52.0)
Hemoglobin: 10.2 g/dL — ABNORMAL LOW (ref 13.0–17.0)
Immature Granulocytes: 3 %
Lymphocytes Relative: 11 %
Lymphs Abs: 1.1 10*3/uL (ref 0.7–4.0)
MCH: 26.6 pg (ref 26.0–34.0)
MCHC: 33.3 g/dL (ref 30.0–36.0)
MCV: 79.9 fL — ABNORMAL LOW (ref 80.0–100.0)
Monocytes Absolute: 0.7 10*3/uL (ref 0.1–1.0)
Monocytes Relative: 7 %
Neutro Abs: 7.3 10*3/uL (ref 1.7–7.7)
Neutrophils Relative %: 77 %
Platelets: 484 10*3/uL — ABNORMAL HIGH (ref 150–400)
RBC: 3.83 MIL/uL — ABNORMAL LOW (ref 4.22–5.81)
RDW: 18.5 % — ABNORMAL HIGH (ref 11.5–15.5)
WBC: 9.5 10*3/uL (ref 4.0–10.5)
nRBC: 0 % (ref 0.0–0.2)

## 2021-07-09 LAB — GLUCOSE, CAPILLARY
Glucose-Capillary: 110 mg/dL — ABNORMAL HIGH (ref 70–99)
Glucose-Capillary: 105 mg/dL — ABNORMAL HIGH (ref 70–99)
Glucose-Capillary: 102 mg/dL — ABNORMAL HIGH (ref 70–99)
Glucose-Capillary: 97 mg/dL (ref 70–99)

## 2021-07-09 LAB — PROTIME-INR
INR: 1.1 (ref 0.8–1.2)
Prothrombin Time: 14 seconds (ref 11.4–15.2)

## 2021-07-09 LAB — MAGNESIUM: Magnesium: 1.9 mg/dL (ref 1.7–2.4)

## 2021-07-09 MED ORDER — ENSURE ENLIVE PO LIQD
237.0000 mL | Freq: Two times a day (BID) | ORAL | Status: DC
  Administered 2021-07-09: 237 mL via ORAL

## 2021-07-09 NOTE — TOC Progression Note (Signed)
Transition of Care [facility]) - Progression Note    Patient Details  Name: Evan Chambers MRN: [id number] Date of Birth: 1960/10/13  Transition of Care [facility]) CM/SW Contact  Reece Agar, [place] Phone Number: 07/09/2021, 3:22 PM  Clinical Narrative:    CSW spoke with [name] at [facility] to get updates on pt auth and provide information on pt est DC tomorrow. [name] will follow up with insurance for auth decision.      Expected Discharge Plan: Skilled Nursing Facility Barriers to Discharge: Active Substance Use - Placement, Insurance Authorization  Expected Discharge Plan and Services Expected Discharge Plan: [place] In-house Referral: Clinical Social Work Discharge Planning Services: NA Post Acute Care Choice: [place] Living arrangements for the past 2 months: Single Family Home                                       Social Determinants of Health (SDOH) Interventions    Readmission Risk Interventions No flowsheet data found.
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
## 2021-07-09 NOTE — Plan of Care (Signed)
  Problem: Health Behavior/Discharge Planning: Goal: Ability to manage health-related needs will improve Outcome: Progressing   Problem: Clinical Measurements: Goal: Ability to maintain clinical measurements within normal limits will improve Outcome: Progressing   Problem: Nutrition: Goal: Adequate nutrition will be maintained Outcome: Progressing   Problem: Skin Integrity: Goal: Risk for impaired skin integrity will decrease Outcome: Progressing

## 2021-07-09 NOTE — Progress Notes (Addendum)
[address]       [place] [address]             [phone]      6 Days Post-Op Procedure(s) (LRB): VIDEO BRONCHOSCOPY (N/A) Subjective: Minor confusion but overall much more conversant and responsive  Objective: Vital signs in last 24 hours: Temp:  [98.2 F (36.8 C)-99 F (37.2 C)] 98.7 F (37.1 C) (10/05 0351) Pulse Rate:  [86-110] 86 (10/05 0351) Cardiac Rhythm: Normal sinus rhythm (10/05 0722) Resp:  [16-21] 16 (10/05 0351) BP: (107-126)/(79-90) 117/81 (10/05 0351) SpO2:  [92 %-94 %] 93 % (10/05 0351) Weight:  [62.2 kg] 62.2 kg (10/05 0453)  Hemodynamic parameters for last 24 hours:    Intake/Output from previous day: 10/04 0701 - 10/05 0700 In: 140 [P.O.:140] Out: 1025 [Urine:1025] Intake/Output this shift: No intake/output data recorded.  General appearance: alert, cooperative, and no distress Heart: regular rate and rhythm Lungs: dim right base Abdomen: benign Extremities: no edema, L AKA Wound: incis healing well  Lab Results: Recent Labs    07/08/21 0034 07/09/21 0140  WBC 11.3* 9.5  HGB 10.7* 10.2*  HCT 31.7* 30.6*  PLT 492* 484*   BMET:  Recent Labs    07/08/21 0034 07/09/21 0140  NA 135 134*  K 3.0* 3.8  CL 103 106  CO2 22 20*  GLUCOSE 120* 122*  BUN 8 10  CREATININE 0.62 0.71  CALCIUM 8.2* 7.7*    PT/INR:  Recent Labs    07/09/21 0140  LABPROT 14.0  INR 1.1   ABG    Component Value Date/Time   PHART 7.417 07/02/2021 0528   HCO3 20.3 07/02/2021 0528   TCO2 23 07/03/2021 1332   ACIDBASEDEF 3.4 (H) 07/02/2021 0528   O2SAT 98.1 07/02/2021 0528   CBG (last 3)  Recent Labs    07/08/21 1640 07/08/21 2115 07/09/21 0633  GLUCAP 116* 137* 110*    Meds Scheduled Meds:  amoxicillin-clavulanate  1 tablet Oral Q12H   bisacodyl  10 mg Oral Daily   enoxaparin (LOVENOX) injection  40 mg Subcutaneous B28U   folic acid  1 mg Oral Daily   guaiFENesin  1,200 mg Oral BID   insulin aspart  0-9 Units  Subcutaneous TID WC   lactose free nutrition  237 mL Oral TID WC   linezolid  600 mg Oral Q12H   mouth rinse  15 mL Mouth Rinse BID   metoprolol tartrate  25 mg Oral BID   multivitamin with minerals  1 tablet Oral Daily   pantoprazole  40 mg Oral Q1200   senna-docusate  1 tablet Oral QHS   tamsulosin  0.4 mg Oral Daily   thiamine  500 mg Oral Daily   vitamin B-12  1,000 mcg Oral Daily   Continuous Infusions: PRN Meds:.acetaminophen **OR** acetaminophen, albuterol, alum & mag hydroxide-simeth, labetalol, ondansetron (ZOFRAN) IV, traMADol  Xrays DG Chest 2 View  Result Date: 07/08/2021 CLINICAL DATA:  Pleural effusion. EXAM: CHEST - 2 VIEW COMPARISON:  Same day. FINDINGS: Stable cardiomediastinal silhouette. No pneumothorax is noted. Stable right basilar atelectasis or scarring is noted. Probable small right pleural effusion. Bony thorax is unremarkable. IMPRESSION: Stable right basilar atelectasis or scarring. Small right pleural effusion. Electronically Signed   By: Marijo Conception M.D.   On: 07/08/2021 09:27   DG Chest 1V REPEAT Same Day  Result Date: 07/07/2021 CLINICAL DATA:  Chest tube removal EXAM: CHEST - 1 VIEW SAME DAY COMPARISON:  Radiograph  07/07/2021 FINDINGS: Unchanged, enlarged cardiac silhouette. There is a small right pleural effusion with adjacent right basilar airspace disease, not significantly changed from prior exam. The right-sided chest tube is been removed. There is no visible pneumothorax. Peripheral left basilar bandlike atelectasis. No acute osseous abnormality. IMPRESSION: Removal of right apical chest tube. No visible pneumothorax. Persistent right basilar opacities and small right pleural effusion. Electronically Signed   By: Maurine Simmering M.D.   On: 07/07/2021 14:03    Results for orders placed or performed during the hospital encounter of 06/23/21  Resp Panel by RT-PCR (Flu A&B, Covid) Nasopharyngeal Swab     Status: None   Collection Time: 06/23/21  7:24 PM    Specimen: Nasopharyngeal Swab; Nasopharyngeal(NP) swabs in vial transport medium  Result Value Ref Range Status   SARS Coronavirus 2 by RT PCR NEGATIVE NEGATIVE Final    Comment: (NOTE) SARS-CoV-2 target nucleic acids are NOT DETECTED.  The SARS-CoV-2 RNA is generally detectable in upper respiratory specimens during the acute phase of infection. The lowest concentration of SARS-CoV-2 viral copies this assay can detect is 138 copies/mL. A negative result does not preclude SARS-Cov-2 infection and should not be used as the sole basis for treatment or other patient management decisions. A negative result may occur with  improper specimen collection/handling, submission of specimen other than nasopharyngeal swab, presence of viral mutation(s) within the areas targeted by this assay, and inadequate number of viral copies(<138 copies/mL). A negative result must be combined with clinical observations, patient history, and epidemiological information. The expected result is Negative.  Fact Sheet for Patients:  EntrepreneurPulse.com.au  Fact Sheet for Healthcare Providers:  IncredibleEmployment.be  This test is no t yet approved or cleared by the [place] FDA and  has been authorized for detection and/or diagnosis of SARS-CoV-2 by FDA under an Emergency Use Authorization (EUA). This EUA will remain  in effect (meaning this test can be used) for the duration of the COVID-19 declaration under Section 564(b)(1) of the Act, 21 U.S.C.section 360bbb-3(b)(1), unless the authorization is terminated  or revoked sooner.       Influenza A by PCR NEGATIVE NEGATIVE Final   Influenza B by PCR NEGATIVE NEGATIVE Final    Comment: (NOTE) The Xpert Xpress SARS-CoV-2/FLU/RSV plus assay is intended as an aid in the diagnosis of influenza from Nasopharyngeal swab specimens and should not be used as a sole basis for treatment. Nasal washings and aspirates are  unacceptable for Xpert Xpress SARS-CoV-2/FLU/RSV testing.  Fact Sheet for Patients: EntrepreneurPulse.com.au  Fact Sheet for Healthcare Providers: IncredibleEmployment.be  This test is not yet approved or cleared by the [place] FDA and has been authorized for detection and/or diagnosis of SARS-CoV-2 by FDA under an Emergency Use Authorization (EUA). This EUA will remain in effect (meaning this test can be used) for the duration of the COVID-19 declaration under Section 564(b)(1) of the Act, 21 U.S.C. section 360bbb-3(b)(1), unless the authorization is terminated or revoked.  Performed at [facility], [place] [address], [place], [place] [address]   Culture, blood (routine x 2)     Status: None   Collection Time: 06/27/21 12:21 PM   Specimen: BLOOD LEFT ARM  Result Value Ref Range Status   Specimen Description BLOOD LEFT ARM  Final   Special Requests   Final    BOTTLES DRAWN AEROBIC AND ANAEROBIC Blood Culture adequate volume   Culture   Final    NO GROWTH 5 DAYS Performed at [facility], [address] [place], [place] [address]    Report Status 07/02/2021 FINAL  Final  Culture, blood (routine x 2)     Status: None   Collection Time: 06/27/21 12:25 PM   Specimen: BLOOD LEFT HAND  Result Value Ref Range Status   Specimen Description BLOOD LEFT HAND  Final   Special Requests   Final    BOTTLES DRAWN AEROBIC AND ANAEROBIC Blood Culture adequate volume   Culture   Final    NO GROWTH 5 DAYS Performed at [facility], [place] [address], [place], [place] [address]    Report Status 07/02/2021 FINAL  Final  Body fluid culture w Gram Stain     Status: None   Collection Time: 06/29/21 10:58 AM   Specimen: Lung, Right; Pleural Fluid  Result Value Ref Range Status   Specimen Description PLEURAL FLUID  Final   Special Requests LUNG RIGHT  Final   Gram Stain   Final    FEW WBC PRESENT,BOTH PMN AND MONONUCLEAR ABUNDANT GRAM  NEGATIVE RODS ABUNDANT GRAM POSITIVE COCCI Performed at [facility], [address], [place], [place] [address]    Culture   Final    MIXED ANAEROBIC FLORA PRESENT.  CALL LAB IF FURTHER IID REQUIRED.   Report Status 07/02/2021 FINAL  Final  MRSA Next Gen by PCR, Nasal     Status: None   Collection Time: 06/30/21  2:00 PM   Specimen: Nasal Mucosa; Nasal Swab  Result Value Ref Range Status   MRSA by PCR Next Gen NOT DETECTED NOT DETECTED Final    Comment: (NOTE) The GeneXpert MRSA Assay (FDA approved for NASAL specimens only), is one component of a comprehensive MRSA colonization surveillance program. It is not intended to diagnose MRSA infection nor to guide or monitor treatment for MRSA infections. Test performance is not FDA approved in patients less than 70 years old. Performed at [facility], [place] [address], [place], [place] [address]   Aerobic/Anaerobic Culture w Gram Stain (surgical/deep wound)     Status: None   Collection Time: 07/01/21  9:10 AM   Specimen: Pleural, Right; Body Fluid  Result Value Ref Range Status   Specimen Description PLEURAL RIGHT  Final   Special Requests SAMPLE A VANCOMYCIN ZOSYN  Final   Gram Stain   Final    ABUNDANT WBC PRESENT, PREDOMINANTLY PMN ABUNDANT GRAM POSITIVE COCCI IN PAIRS AND CHAINS MODERATE GRAM VARIABLE ROD Performed at [facility], [place] [address] [place], [place] [address]    Culture   Final    MIXED ANAEROBIC FLORA PRESENT.  CALL LAB IF FURTHER IID REQUIRED.   Report Status 07/03/2021 FINAL  Final  Aerobic/Anaerobic Culture w Gram Stain (surgical/deep wound)     Status: None   Collection Time: 07/01/21  9:14 AM   Specimen: Pleural, Right; Body Fluid  Result Value Ref Range Status   Specimen Description PLEURAL RIGHT  Final   Special Requests SAMPLE B VANCOMYCIN ZOSYN  Final   Gram Stain   Final    RARE WBC PRESENT,BOTH PMN AND MONONUCLEAR NO ORGANISMS SEEN    Culture   Final    No growth aerobically  or anaerobically. Performed at [facility], [place] [address], [place], [place] [address]    Report Status 07/06/2021 FINAL  Final  Aerobic/Anaerobic Culture w Gram Stain (surgical/deep wound)     Status: None   Collection Time: 07/01/21  9:15 AM   Specimen: Pleural, Right; Body  Fluid  Result Value Ref Range Status   Specimen Description PLEURAL RIGHT  Final   Special Requests SAMPLE C VANCOMYCIN ZOSYN  Final   Gram Stain   Final    RARE WBC PRESENT,BOTH PMN AND MONONUCLEAR NO ORGANISMS SEEN    Culture   Final    FEW DIPHTHEROIDS(CORYNEBACTERIUM SPECIES) Standardized susceptibility testing for this organism is not available. NO ANAEROBES ISOLATED Performed at [facility], [place] [address] [place], [place] [address]    Report Status 07/06/2021 FINAL  Final  Aerobic/Anaerobic Culture w Gram Stain (surgical/deep wound)     Status: None   Collection Time: 07/01/21  9:21 AM   Specimen: Pleural, Right; Body Fluid  Result Value Ref Range Status   Specimen Description PLEURAL RIGHT  Final   Special Requests SAMPLE D VANCOMYCIN ZOSYN  Final   Gram Stain   Final    RARE WBC PRESENT,BOTH PMN AND MONONUCLEAR FEW GRAM POSITIVE COCCI RARE GRAM VARIABLE ROD    Culture   Final    FEW STREPTOCOCCUS SANGUINIS FEW DIPHTHEROIDS(CORYNEBACTERIUM SPECIES) Standardized susceptibility testing for this organism is not available. NO ANAEROBES ISOLATED Performed at [facility], [place] [address], [place], [place] [address]    Report Status 07/06/2021 FINAL  Final  Anaerobic culture w Gram Stain     Status: None   Collection Time: 07/03/21  2:08 PM   Specimen: Bronchial Washing, Right; Respiratory  Result Value Ref Range Status   Specimen Description BRONCHIAL WASHINGS  Final   Special Requests PT ON VANC,ZOSYN  Final   Gram Stain   Final    ABUNDANT WBC PRESENT, PREDOMINANTLY MONONUCLEAR RARE GRAM POSITIVE COCCI    Culture   Final    NO ANAEROBES ISOLATED Performed at [facility], [place] [address] [place], [place] [address]    Report Status 07/08/2021 FINAL  Final  Culture, Respiratory w Gram Stain     Status: None   Collection Time: 07/03/21  2:08 PM   Specimen: Bronchial Wash  Result Value Ref Range Status   Specimen Description BRONCHIAL WASHINGS  Final   Special Requests PT ON VANC,ZOSYN  Final   Gram Stain   Final    NO ORGANISMS SEEN SQUAMOUS EPITHELIAL CELLS PRESENT ABUNDANT WBC PRESENT, PREDOMINANTLY MONONUCLEAR RARE GRAM POSITIVE COCCI Performed at [facility], [place] [address] [place], [place] [address]    Culture RARE CANDIDA KRUSEI  Final   Report Status 07/07/2021 FINAL  Final     Assessment/Plan: S/P Procedure(s) (LRB): VIDEO BRONCHOSCOPY (N/A)   1 afeb, VSS  2 ID-Now on Augmentin and Linezolid for right empyema. Right pleural culture showed Strep Sanguinis and Diphtheroids 3 sats ok on RA 4 no leukocytosis , Abs immature Granulocytes 0.30 c/w left shift. Anemia is stable 5 protein cal malnutrition , albumin 1.6 6 normal renal fxn 7 BS controlled 8 medical management/disposition per primary 9 CT sutures will be removed soon    LOS: 15 days    John Giovanni PA-C Pager [phone] 07/09/2021   Patient seen and examined, agree with above  Remo Lipps C. Roxan Hockey, MD [facility] [phone]

## 2021-07-09 NOTE — Progress Notes (Signed)
Noted with poor appetite, encourage to increase po intake but refused. MD aware.

## 2021-07-09 NOTE — Plan of Care (Signed)
  Problem: Health Behavior/Discharge Planning: Goal: Ability to manage health-related needs will improve Outcome: Progressing   Problem: Clinical Measurements: Goal: Ability to maintain clinical measurements within normal limits will improve Outcome: Progressing   Problem: Activity: Goal: Risk for activity intolerance will decrease Outcome: Progressing   Problem: Nutrition: Goal: Adequate nutrition will be maintained Outcome: Progressing   Problem: Pain Managment: Goal: General experience of comfort will improve Outcome: Progressing   Problem: Safety: Goal: Ability to remain free from injury will improve Outcome: Progressing   Problem: Skin Integrity: Goal: Risk for impaired skin integrity will decrease Outcome: Progressing

## 2021-07-10 ENCOUNTER — Inpatient Hospital Stay (HOSPITAL_COMMUNITY): Payer: Medicare HMO

## 2021-07-10 LAB — GLUCOSE, CAPILLARY
Glucose-Capillary: 103 mg/dL — ABNORMAL HIGH (ref 70–99)
Glucose-Capillary: 132 mg/dL — ABNORMAL HIGH (ref 70–99)
Glucose-Capillary: 117 mg/dL — ABNORMAL HIGH (ref 70–99)
Glucose-Capillary: 109 mg/dL — ABNORMAL HIGH (ref 70–99)

## 2021-07-10 LAB — CBC
HCT: 33.3 % — ABNORMAL LOW (ref 39.0–52.0)
Hemoglobin: 11 g/dL — ABNORMAL LOW (ref 13.0–17.0)
MCH: 26.8 pg (ref 26.0–34.0)
MCHC: 33 g/dL (ref 30.0–36.0)
MCV: 81.2 fL (ref 80.0–100.0)
Platelets: 545 10*3/uL — ABNORMAL HIGH (ref 150–400)
RBC: 4.1 MIL/uL — ABNORMAL LOW (ref 4.22–5.81)
RDW: 18.3 % — ABNORMAL HIGH (ref 11.5–15.5)
WBC: 20.3 10*3/uL — ABNORMAL HIGH (ref 4.0–10.5)
nRBC: 0 % (ref 0.0–0.2)

## 2021-07-10 LAB — BASIC METABOLIC PANEL
Anion gap: 9 (ref 5–15)
BUN: 6 mg/dL (ref 6–20)
CO2: 21 mmol/L — ABNORMAL LOW (ref 22–32)
Calcium: 8.2 mg/dL — ABNORMAL LOW (ref 8.9–10.3)
Chloride: 106 mmol/L (ref 98–111)
Creatinine, Ser: 0.74 mg/dL (ref 0.61–1.24)
GFR, Estimated: >60 mL/min (ref >60)
Glucose, Bld: 162 mg/dL — ABNORMAL HIGH (ref 70–99)
Potassium: 2.9 mmol/L — ABNORMAL LOW (ref 3.5–5.1)
Sodium: 136 mmol/L (ref 135–145)

## 2021-07-10 LAB — LACTIC ACID, PLASMA: Lactic Acid, Venous: 2.5 mmol/L (ref 0.5–1.9)

## 2021-07-10 MED ORDER — FLEET ENEMA 7-19 GM/118ML RE ENEM
1.0000 | ENEMA | Freq: Once | RECTAL | Status: AC
  Administered 2021-07-10: 1 via RECTAL
  Filled 2021-07-10: qty 1

## 2021-07-10 MED ORDER — LINEZOLID 600 MG/300ML IV SOLN
600.0000 mg | Freq: Two times a day (BID) | INTRAVENOUS | Status: DC
  Administered 2021-07-11 – 2021-07-13 (×6): 600 mg via INTRAVENOUS
  Filled 2021-07-10 (×7): qty 300

## 2021-07-10 MED ORDER — PANTOPRAZOLE SODIUM 40 MG IV SOLR
40.0000 mg | Freq: Two times a day (BID) | INTRAVENOUS | Status: DC
  Administered 2021-07-10 – 2021-07-13 (×8): 40 mg via INTRAVENOUS
  Filled 2021-07-10 (×8): qty 40

## 2021-07-10 MED ORDER — SODIUM CHLORIDE 0.9 % IV SOLN: INTRAVENOUS | Status: DC

## 2021-07-10 MED ORDER — SODIUM CHLORIDE 0.9 % IV SOLN
3.0000 g | Freq: Four times a day (QID) | INTRAVENOUS | Status: DC
  Administered 2021-07-10 – 2021-07-13 (×13): 3 g via INTRAVENOUS
  Filled 2021-07-10 (×14): qty 8

## 2021-07-10 MED ORDER — SODIUM CHLORIDE 0.9 % IV BOLUS
500.0000 mL | Freq: Once | INTRAVENOUS | Status: AC
  Administered 2021-07-10: 500 mL via INTRAVENOUS

## 2021-07-10 MED ORDER — METOPROLOL TARTRATE 5 MG/5ML IV SOLN
2.5000 mg | Freq: Three times a day (TID) | INTRAVENOUS | Status: DC
  Administered 2021-07-10 – 2021-07-13 (×9): 2.5 mg via INTRAVENOUS
  Filled 2021-07-10 (×9): qty 5

## 2021-07-10 MED ORDER — POTASSIUM CHLORIDE 10 MEQ/100ML IV SOLN
10.0000 meq | INTRAVENOUS | Status: AC
  Administered 2021-07-10 (×4): 10 meq via INTRAVENOUS
  Filled 2021-07-10 (×4): qty 100

## 2021-07-10 MED ORDER — IOHEXOL 350 MG/ML SOLN
100.0000 mL | Freq: Once | INTRAVENOUS | Status: AC | PRN
  Administered 2021-07-10: 100 mL via INTRAVENOUS
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
## 2021-07-10 NOTE — Progress Notes (Signed)
   07/10/21 2015  Assess: MEWS Score  Temp 99.4 F (37.4 C)  BP 124/79  Pulse Rate (!) 112  ECG Heart Rate (!) 113  Resp (!) 23  Level of Consciousness Alert  SpO2 97 %  O2 Device Room Air  Patient Activity (if Appropriate) In bed  Assess: MEWS Score  MEWS Temp 0  MEWS Systolic 0  MEWS Pulse 2  MEWS RR 1  MEWS LOC 0  MEWS Score 3  MEWS Score Color Yellow  Assess: if the MEWS score is Yellow or Red  Were vital signs taken at a resting state? Yes  Focused Assessment Change from prior assessment (see assessment flowsheet)  Early Detection of Sepsis Score *See Row Information* Low  MEWS guidelines implemented *See Row Information* Yes  Treat  MEWS Interventions Escalated (See documentation below)  Pain Scale 0-10  Pain Score 0  Take Vital Signs  Increase Vital Sign Frequency  Yellow: Q 2hr X 2 then Q 4hr X 2, if remains yellow, continue Q 4hrs  Escalate  MEWS: Escalate Yellow: discuss with charge nurse/RN and consider discussing with provider and RRT (Dr aware of changing VS)  Notify: Charge Nurse/RN  Name of Charge Nurse/RN Notified Tanya, RN  Date Charge Nurse/RN Notified 07/10/21  Time Charge Nurse/RN Notified 2020

## 2021-07-10 NOTE — TOC Progression Note (Addendum)
Transition of Care [facility]) - Progression Note    Patient Details  Name: MALAK ORANTES MRN: [id number] Date of Birth: [birth year]  Transition of Care [facility]) CM/SW Contact  Reece Agar, [place] Phone Number: 07/10/2021, 1:03 PM  Clinical Narrative:    CSW following, pt not medically ready to DC. CSW requested covid test for possible DC tomorrow.   Expected Discharge Plan: Skilled Nursing Facility Barriers to Discharge: Active Substance Use - Placement, Insurance Authorization  Expected Discharge Plan and Services Expected Discharge Plan: [place] In-house Referral: Clinical Social Work Discharge Planning Services: NA Post Acute Care Choice: [place] Living arrangements for the past 2 months: Single Family Home                                       Social Determinants of Health (SDOH) Interventions    Readmission Risk Interventions No flowsheet data found.

## 2021-07-10 NOTE — Progress Notes (Signed)
General Surgery  I was notified of the patient's lactated of 2.5. I reviewed his CT scan which was completed earlier this evening. The small bowel and stomach are decompressed, and there is mild diffuse colonic distention with contrast from a previous study throughout the colon.  There is also significant stool within the rectum but no sign of bowel obstruction or bowel ischemia.  Recommend an enema or suppository to help disimpact the rectum.  Keep n.p.o. and place NG tube if patient has nausea or vomiting.  Michaelle Birks, [facility] 07/10/21 7:29 PM

## 2021-07-10 NOTE — Progress Notes (Addendum)
      [address]       [place] [address]             [phone]       7 Days Post-Op Procedure(s) (LRB): VIDEO BRONCHOSCOPY (N/A)  Subjective: Patient sleeping and awakened this am.  Objective: Vital signs in last 24 hours: Temp:  [97.8 F (36.6 C)-98.9 F (37.2 C)] 97.9 F (36.6 C) (10/06 0700) Pulse Rate:  [84-100] 100 (10/06 0700) Cardiac Rhythm: Sinus tachycardia (10/06 0711) Resp:  [16-22] 16 (10/06 0700) BP: (109-129)/(73-92) 126/85 (10/06 0700) SpO2:  [92 %-100 %] 100 % (10/06 0700) Weight:  [62 kg] 62 kg (10/06 0500)     Intake/Output from previous day: 10/05 0701 - 10/06 0700 In: 380 [P.O.:380] Out: 300 [Urine:300]   Physical Exam:  Cardiovascular: Slightly tachycardic Pulmonary: Clear to auscultation on left and slightly diminished right basilar breath sounds Extremities: No RLE edema Wounds: Clean and dry.  No erythema or signs of infection.    Lab Results: CBC: Recent Labs    07/08/21 0034 07/09/21 0140  WBC 11.3* 9.5  HGB 10.7* 10.2*  HCT 31.7* 30.6*  PLT 492* 484*    BMET:  Recent Labs    07/08/21 0034 07/09/21 0140  NA 135 134*  K 3.0* 3.8  CL 103 106  CO2 22 20*  GLUCOSE 120* 122*  BUN 8 10  CREATININE 0.62 0.71  CALCIUM 8.2* 7.7*     PT/INR:  Recent Labs    07/09/21 0140  LABPROT 14.0  INR 1.1    ABG:  INR: Will add last result for INR, ABG once components are confirmed Will add last 4 CBG results once components are confirmed  Assessment/Plan:  1. CV - Tachy at times. On Lopressor 25 mg bid. 2.  Pulmonary - On room air. CXR this am appear stable (improving right opacity, no pneumothorax). Encourage incentive spirometer 3. DM-CBGs 102/97/103. On Insulin. Will restart Metformin at discharge. Pre op HGA1C 6. 4. ID-On Augmentin and Linezolid for right empyema. Right pleural culture showed Strep Sanguinis and Diphtheroids. He needs long term antibiotic treatment 5. Expected post op blood loss  anemia-Last H and H  10.2 and 30.6 6. Follow up appointment arranged. Management per primary. Patient to go to SNF  Sharalyn Ink ZimmermanPA-C 07/10/2021,7:50 AM [phone]   Patient seen and examined Ok for SNF from our standpoint  Remo Lipps C. Roxan Hockey, MD [facility] [phone]
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
## 2021-07-11 ENCOUNTER — Inpatient Hospital Stay (HOSPITAL_COMMUNITY): Payer: Medicare HMO

## 2021-07-11 DIAGNOSIS — N170 Acute kidney failure with tubular necrosis: Secondary | ICD-10-CM | POA: Diagnosis not present

## 2021-07-11 LAB — BASIC METABOLIC PANEL
Anion gap: 10 (ref 5–15)
BUN: <5 mg/dL — ABNORMAL LOW (ref 6–20)
CO2: 18 mmol/L — ABNORMAL LOW (ref 22–32)
Calcium: 7.7 mg/dL — ABNORMAL LOW (ref 8.9–10.3)
Chloride: 109 mmol/L (ref 98–111)
Creatinine, Ser: 0.7 mg/dL (ref 0.61–1.24)
GFR, Estimated: >60 mL/min (ref >60)
Glucose, Bld: 110 mg/dL — ABNORMAL HIGH (ref 70–99)
Potassium: 2.9 mmol/L — ABNORMAL LOW (ref 3.5–5.1)
Sodium: 137 mmol/L (ref 135–145)

## 2021-07-11 LAB — CBC
HCT: 28.5 % — ABNORMAL LOW (ref 39.0–52.0)
Hemoglobin: 9.4 g/dL — ABNORMAL LOW (ref 13.0–17.0)
MCH: 26.6 pg (ref 26.0–34.0)
MCHC: 33 g/dL (ref 30.0–36.0)
MCV: 80.7 fL (ref 80.0–100.0)
Platelets: 434 10*3/uL — ABNORMAL HIGH (ref 150–400)
RBC: 3.53 MIL/uL — ABNORMAL LOW (ref 4.22–5.81)
RDW: 18.4 % — ABNORMAL HIGH (ref 11.5–15.5)
WBC: 19.3 10*3/uL — ABNORMAL HIGH (ref 4.0–10.5)
nRBC: 0 % (ref 0.0–0.2)

## 2021-07-11 LAB — GLUCOSE, CAPILLARY
Glucose-Capillary: 91 mg/dL (ref 70–99)
Glucose-Capillary: 94 mg/dL (ref 70–99)
Glucose-Capillary: 93 mg/dL (ref 70–99)
Glucose-Capillary: 98 mg/dL (ref 70–99)

## 2021-07-11 LAB — MAGNESIUM: Magnesium: 1.8 mg/dL (ref 1.7–2.4)

## 2021-07-11 MED ORDER — POLYETHYLENE GLYCOL 3350 17 G PO PACK
17.0000 g | PACK | Freq: Two times a day (BID) | ORAL | Status: DC
  Administered 2021-07-11 – 2021-07-13 (×4): 17 g via ORAL
  Filled 2021-07-11 (×6): qty 1

## 2021-07-11 MED ORDER — POTASSIUM CHLORIDE 10 MEQ/100ML IV SOLN
10.0000 meq | INTRAVENOUS | Status: AC
  Administered 2021-07-11 (×4): 10 meq via INTRAVENOUS
  Filled 2021-07-11 (×4): qty 100

## 2021-07-11 MED ORDER — BISACODYL 10 MG RE SUPP
10.0000 mg | Freq: Once | RECTAL | Status: AC
  Administered 2021-07-11: 10 mg via RECTAL
  Filled 2021-07-11: qty 1

## 2021-07-11 MED ORDER — POTASSIUM CHLORIDE IN NACL 40-0.9 MEQ/L-% IV SOLN
INTRAVENOUS | Status: DC
  Administered 2021-07-14: 100 mL/h via INTRAVENOUS
  Filled 2021-07-11 (×8): qty 1000

## 2021-07-11 NOTE — TOC Progression Note (Signed)
Transition of Care [facility]) - Progression Note    Patient Details  Name: Evan Chambers MRN: [id number] Date of Birth: [birth year]  Transition of Care [facility]) CM/SW [place], [place] Phone Number: 07/11/2021, 2:29 PM  Clinical Narrative:    CSW spoke with [name] at [facility] to follow up on pt DC status, pt is not medically ready for DC. Pt auth expires on the 9th of October, Teresa at [facility] will restart auth on Monday.   Expected Discharge Plan: Skilled Nursing Facility Barriers to Discharge: Active Substance Use - Placement, Insurance Authorization  Expected Discharge Plan and Services Expected Discharge Plan: [place] In-house Referral: Clinical Social Work Discharge Planning Services: NA Post Acute Care Choice: [place] Living arrangements for the past 2 months: Single Family Home                                       Social Determinants of Health (SDOH) Interventions    Readmission Risk Interventions No flowsheet data found.
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
## 2021-07-11 NOTE — Progress Notes (Signed)
8 Days Post-Op  Subjective: Afebrile, WBC 19. Patient denies abdominal pain this morning. Says he is not having bowel movements but multiple stools have been charted overnight after enema.   Objective: Vital signs in last 24 hours: Temp:  [98 F (36.7 C)-99.4 F (37.4 C)] 98.4 F (36.9 C) (10/07 0315) Pulse Rate:  [100-115] 100 (10/07 0315) Resp:  [15-23] 19 (10/07 0315) BP: (115-130)/(68-90) 120/81 (10/07 0315) SpO2:  [97 %-100 %] 97 % (10/07 0315) Weight:  [63.6 kg] 63.6 kg (10/07 0613) Last BM Date: 07/11/21  Intake/Output from previous day: 10/06 0701 - 10/07 0700 In: 240 [P.O.:240] Out: 1125 [Urine:1125] Intake/Output this shift: No intake/output data recorded.  PE: General: resting comfortably, NAD Neuro: alert and oriented, no focal deficits Resp: normal work of breathing Abdomen: soft, mildly distended, nontender to palpation Extremities: warm and well-perfused   Lab Results:  Recent Labs    07/10/21 1127 07/11/21 0126  WBC 20.3* 19.3*  HGB 11.0* 9.4*  HCT 33.3* 28.5*  PLT 545* 434*   BMET Recent Labs    07/10/21 1127 07/11/21 0126  NA 136 137  K 2.9* 2.9*  CL 106 109  CO2 21* 18*  GLUCOSE 162* 110*  BUN 6 <5*  CREATININE 0.74 0.70  CALCIUM 8.2* 7.7*   PT/INR Recent Labs    07/09/21 0140  LABPROT 14.0  INR 1.1   CMP     Component Value Date/Time   NA 137 07/11/2021 0126   K 2.9 (L) 07/11/2021 0126   CL 109 07/11/2021 0126   CO2 18 (L) 07/11/2021 0126   GLUCOSE 110 (H) 07/11/2021 0126   BUN <5 (L) 07/11/2021 0126   CREATININE 0.70 07/11/2021 0126   CALCIUM 7.7 (L) 07/11/2021 0126   PROT 5.6 (L) 07/09/2021 0140   ALBUMIN 1.6 (L) 07/09/2021 0140   AST 19 07/09/2021 0140   ALT 39 07/09/2021 0140   ALKPHOS 134 (H) 07/09/2021 0140   BILITOT 1.0 07/09/2021 0140   GFRNONAA >60 07/11/2021 0126   GFRAA 47 (L) 03/19/2019 0724   Lipase     Component Value Date/Time   LIPASE 49 06/27/2021 0936       Studies/Results: DG Abd  1 View  Result Date: 07/10/2021 CLINICAL DATA:  Abdominal pain. EXAM: ABDOMEN - 1 VIEW COMPARISON:  Same day. FINDINGS: Almyra Brace is seen involving the right side of the abdomen, but it is uncertain if this represents pneumoperitoneum or air within bowel. IMPRESSION: Lucency is seen in the right side of the abdomen, but it is uncertain if this represents pneumoperitoneum or air within bowel. CT scan is recommended for further evaluation. These results will be called to the ordering clinician or representative by the Radiologist Assistant, and communication documented in the PACS or [employer] Dashboard. Electronically Signed   By: Marijo Conception M.D.   On: 07/10/2021 13:49   DG Abd 1 View  Result Date: 07/10/2021 CLINICAL DATA:  Abdominal pain EXAM: ABDOMEN - 1 VIEW COMPARISON:  06/24/2021 FINDINGS: Prominent gaseous distension of the colon and rectum. There is enteric contrast within the colon. Air distended loops of small bowel are also seen, although do not appear to be abnormally dilated. The upper abdomen is partially excluded from the field of view. There is relatively increased lucency within the upper abdomen. IMPRESSION: 1. Relatively increased lucency within the upper abdomen. Pneumoperitoneum not excluded on portable supine view. Recommend lateral decubitus view of the abdomen to further evaluate. 2. Prominent gaseous distension of the colon and  rectum suggests ileus. Continued radiographic follow-up is suggested. These results will be called to the ordering clinician or representative by the Radiologist Assistant, and communication documented in the PACS or [employer]. Electronically Signed   By: Davina Poke D.O.   On: 07/10/2021 12:26   CT ABDOMEN PELVIS W CONTRAST  Result Date: 07/10/2021 CLINICAL DATA:  Acute abdominal pain EXAM: CT ABDOMEN AND PELVIS WITH CONTRAST TECHNIQUE: Multidetector CT imaging of the abdomen and pelvis was performed using the standard protocol following bolus  administration of intravenous contrast. CONTRAST:  157mL OMNIPAQUE IOHEXOL 350 MG/ML SOLN COMPARISON:  None. FINDINGS: Lower chest: There is right basilar consolidation and small associated parapneumonic effusion. Mild left basilar atelectasis. Cardiac size within normal limits. Small pericardial effusion. Small hiatal hernia. Hepatobiliary: No focal liver abnormality is seen. No gallstones, gallbladder wall thickening, or biliary dilatation. Pancreas: Unremarkable Spleen: Unremarkable Adrenals/Urinary Tract: The adrenal glands are unremarkable. The kidneys are normal in size and position. Simple cortical cyst noted within the lower pole of the left kidney. The kidneys are otherwise unremarkable. The bladder is unremarkable. Stomach/Bowel: The stomach and small bowel are unremarkable. Contrast from prior swallowing study of 06/30/2021 is seen throughout the colon and rectal vault. The colon appears mildly dilated in keeping with an underlying mild colonic ileus, however, there is no evidence of obstruction. No free intraperitoneal gas or fluid. Appendix normal. Vascular/Lymphatic: Mild aortoiliac atherosclerotic calcification. No aortic aneurysm. No pathologic adenopathy within the abdomen and pelvis. Reproductive: The prostate gland is absent. Other: Mild subcutaneous body wall edema within the flanks. No abdominal wall hernia. Rectum unremarkable. Musculoskeletal: No acute bone abnormality. No lytic or blastic bone lesion. IMPRESSION: Right basilar consolidation and associated small right parapneumonic effusion. No acute intra-abdominal pathology. Mild colonic ileus. Aortic Atherosclerosis (ICD10-I70.0).  A Electronically Signed   By: Fidela Salisbury M.D.   On: 07/10/2021 18:32        Assessment/Plan  60 yo male with chronic aspiration pneumonia and empyema s/p VATS decort on 9/27. Now with abdominal distension and leukocytosis. CT scan shows no pneumoperitoneum, pneumatosis, or signs of bowel obstruction.  He does have a lot of stool in the rectum with some colonic distension. Abdomen is nontender this morning and he had multiple stools overnight after receiving an enema. Ok to begin liquids today. Continue bowel regimen. Source of leukocytosis does not appear to be intraabdominal.   LOS: 17 days    Michaelle Birks, MD [facility] Surgery General, Hepatobiliary and Pancreatic Surgery 07/11/21 7:32 AM

## 2021-07-11 NOTE — Progress Notes (Signed)
Re-examined this afternoon. Patient denied abdominal pain. Has been having bowel movements and passing flatus. Abdominal exam remains benign. CT scan yesterday noted mild colonic ileus. Would recommend NPO if having nausea or vomiting, if tolerating PO intake and having bowel function can slowly advance diet as tolerated. Recommend keeping K > 4.0 and Mg > 2.0 to optimize bowel function. Needs to mobilize as able. If tolerating PO intake could also consider colace/miralax/suppositories/enemas as needed. No other recommendations from a surgical standpoint, general surgery will sign off at this time. If further concerns or questions in regards to management of ileus consider GI consult.   Norm Parcel, [facility] Surgery 07/11/2021, 4:43 PM Please see Amion for pager number during day hours 7:00am-4:30pm

## 2021-07-11 NOTE — Plan of Care (Signed)
  Problem: Clinical Measurements: Goal: Ability to maintain clinical measurements within normal limits will improve Outcome: Progressing   Problem: Coping: Goal: Level of anxiety will decrease Outcome: Progressing   Problem: Elimination: Goal: Will not experience complications related to bowel motility Outcome: Progressing Goal: Will not experience complications related to urinary retention Outcome: Progressing   Problem: Pain Managment: Goal: General experience of comfort will improve Outcome: Progressing   Problem: Safety: Goal: Ability to remain free from injury will improve Outcome: Progressing   Problem: Skin Integrity: Goal: Risk for impaired skin integrity will decrease Outcome: Progressing   Problem: Cardiac: Goal: Will achieve and/or maintain hemodynamic stability Outcome: Progressing   Problem: Pain Management: Goal: Pain level will decrease Outcome: Progressing
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
## 2021-07-11 NOTE — Progress Notes (Addendum)
[address]       [employer] [id number]             [phone]      8 Days Post-Op Procedure(s) (LRB): VIDEO BRONCHOSCOPY (N/A) Subjective: Feels ok, weak, some abdominal pain  Objective: Vital signs in last 24 hours: Temp:  [98 F (36.7 C)-99.4 F (37.4 C)] 98.4 F (36.9 C) (10/07 0315) Pulse Rate:  [100-115] 100 (10/07 0315) Cardiac Rhythm: Normal sinus rhythm (10/07 0713) Resp:  [15-23] 19 (10/07 0315) BP: (115-130)/(68-90) 120/81 (10/07 0315) SpO2:  [97 %-100 %] 97 % (10/07 0315) Weight:  [63.6 kg] 63.6 kg (10/07 0613)  Hemodynamic parameters for last 24 hours:    Intake/Output from previous day: 10/06 0701 - 10/07 0700 In: 240 [P.O.:240] Out: 1125 [Urine:1125] Intake/Output this shift: No intake/output data recorded.  General appearance: alert, cooperative, distracted, fatigued, and no distress Heart: regular rate and rhythm Lungs: dim right>left base Abdomen: mod distension, +  mild TTP without guarding or rebound, BS Extremities: no edema or right calf tenderness Wound: healing well  Lab Results: Recent Labs    07/10/21 1127 07/11/21 0126  WBC 20.3* 19.3*  HGB 11.0* 9.4*  HCT 33.3* 28.5*  PLT 545* 434*   BMET:  Recent Labs    07/10/21 1127 07/11/21 0126  NA 136 137  K 2.9* 2.9*  CL 106 109  CO2 21* 18*  GLUCOSE 162* 110*  BUN 6 <5*  CREATININE 0.74 0.70  CALCIUM 8.2* 7.7*    PT/INR:  Recent Labs    07/09/21 0140  LABPROT 14.0  INR 1.1   ABG    Component Value Date/Time   PHART 7.417 07/02/2021 0528   HCO3 20.3 07/02/2021 0528   TCO2 23 07/03/2021 1332   ACIDBASEDEF 3.4 (H) 07/02/2021 0528   O2SAT 98.1 07/02/2021 0528   CBG (last 3)  Recent Labs    07/10/21 1546 07/10/21 2120 07/11/21 0611  GLUCAP 117* 109* 91    Meds Scheduled Meds:  guaiFENesin  1,200 mg Oral BID   insulin aspart  0-9 Units Subcutaneous TID WC   mouth rinse  15 mL Mouth Rinse BID   metoprolol tartrate  2.5 mg Intravenous Q8H    pantoprazole (PROTONIX) IV  40 mg Intravenous Q12H   polyethylene glycol  17 g Oral BID   tamsulosin  0.4 mg Oral Daily   thiamine  500 mg Oral Daily   vitamin B-12  1,000 mcg Oral Daily   Continuous Infusions:  0.9 % NaCl with KCl 40 mEq / L     ampicillin-sulbactam (UNASYN) IV 3 g (07/11/21 0444)   linezolid (ZYVOX) IV 600 mg (07/11/21 0132)   potassium chloride 10 mEq (07/11/21 0706)   PRN Meds:.acetaminophen **OR** acetaminophen, albuterol, alum & mag hydroxide-simeth, labetalol, ondansetron (ZOFRAN) IV, traMADol  Xrays DG Abd 1 View  Result Date: 07/10/2021 CLINICAL DATA:  Abdominal pain. EXAM: ABDOMEN - 1 VIEW COMPARISON:  Same day. FINDINGS: Almyra Brace is seen involving the right side of the abdomen, but it is uncertain if this represents pneumoperitoneum or air within bowel. IMPRESSION: Lucency is seen in the right side of the abdomen, but it is uncertain if this represents pneumoperitoneum or air within bowel. CT scan is recommended for further evaluation. These results will be called to the ordering clinician or representative by the Radiologist Assistant, and communication documented in the PACS or zVision Dashboard. Electronically Signed   By: Marijo Conception M.D.   On:  07/10/2021 13:49   DG Abd 1 View  Result Date: 07/10/2021 CLINICAL DATA:  Abdominal pain EXAM: ABDOMEN - 1 VIEW COMPARISON:  06/24/2021 FINDINGS: Prominent gaseous distension of the colon and rectum. There is enteric contrast within the colon. Air distended loops of small bowel are also seen, although do not appear to be abnormally dilated. The upper abdomen is partially excluded from the field of view. There is relatively increased lucency within the upper abdomen. IMPRESSION: 1. Relatively increased lucency within the upper abdomen. Pneumoperitoneum not excluded on portable supine view. Recommend lateral decubitus view of the abdomen to further evaluate. 2. Prominent gaseous distension of the colon and rectum  suggests ileus. Continued radiographic follow-up is suggested. These results will be called to the ordering clinician or representative by the Radiologist Assistant, and communication documented in the PACS or [employer]. Electronically Signed   By: Davina Poke D.O.   On: 07/10/2021 12:26   CT ABDOMEN PELVIS W CONTRAST  Result Date: 07/10/2021 CLINICAL DATA:  Acute abdominal pain EXAM: CT ABDOMEN AND PELVIS WITH CONTRAST TECHNIQUE: Multidetector CT imaging of the abdomen and pelvis was performed using the standard protocol following bolus administration of intravenous contrast. CONTRAST:  1110mL OMNIPAQUE IOHEXOL 350 MG/ML SOLN COMPARISON:  None. FINDINGS: Lower chest: There is right basilar consolidation and small associated parapneumonic effusion. Mild left basilar atelectasis. Cardiac size within normal limits. Small pericardial effusion. Small hiatal hernia. Hepatobiliary: No focal liver abnormality is seen. No gallstones, gallbladder wall thickening, or biliary dilatation. Pancreas: Unremarkable Spleen: Unremarkable Adrenals/Urinary Tract: The adrenal glands are unremarkable. The kidneys are normal in size and position. Simple cortical cyst noted within the lower pole of the left kidney. The kidneys are otherwise unremarkable. The bladder is unremarkable. Stomach/Bowel: The stomach and small bowel are unremarkable. Contrast from prior swallowing study of 06/30/2021 is seen throughout the colon and rectal vault. The colon appears mildly dilated in keeping with an underlying mild colonic ileus, however, there is no evidence of obstruction. No free intraperitoneal gas or fluid. Appendix normal. Vascular/Lymphatic: Mild aortoiliac atherosclerotic calcification. No aortic aneurysm. No pathologic adenopathy within the abdomen and pelvis. Reproductive: The prostate gland is absent. Other: Mild subcutaneous body wall edema within the flanks. No abdominal wall hernia. Rectum unremarkable. Musculoskeletal:  No acute bone abnormality. No lytic or blastic bone lesion. IMPRESSION: Right basilar consolidation and associated small right parapneumonic effusion. No acute intra-abdominal pathology. Mild colonic ileus. Aortic Atherosclerosis (ICD10-I70.0).  A Electronically Signed   By: Fidela Salisbury M.D.   On: 07/10/2021 18:32    Assessment/Plan: S/P Procedure(s) (LRB): VIDEO BRONCHOSCOPY (N/A)  1 tmax 99.4, VSS sinus rhythm 2 sats sats good on RA 3 good UOP, + BM 4 leukocytosis trend stable 20.3 yesterday, today 19.3 5 K+ 2.9- replacement ordered, normal renal fxn 6 anemia - H/H has dropped - general surgery has been consulted to assist with abdominal issues- has responded to enema- exam improved, CT scan shows no pneumoperitoneum, pneumatosis, or signs of bowel obstruction, mild colonic ileus 7 push rehab therapies as able 8 cont current abx, med management as per hospitalist    LOS: 17 days    John Giovanni PA-C Pager [phone] 07/11/2021   Patient seen and examined, agree with assessment and plan as outlined above New leukocytosis, some abdominal distention CT shows expected postop changes in right base  Jaionna Weisse C. Roxan Hockey, MD [facility] [phone]

## 2021-07-12 ENCOUNTER — Inpatient Hospital Stay (HOSPITAL_COMMUNITY): Payer: Medicare HMO

## 2021-07-12 DIAGNOSIS — N170 Acute kidney failure with tubular necrosis: Secondary | ICD-10-CM | POA: Diagnosis not present

## 2021-07-12 LAB — CBC
HCT: 30.7 % — ABNORMAL LOW (ref 39.0–52.0)
Hemoglobin: 10.5 g/dL — ABNORMAL LOW (ref 13.0–17.0)
MCH: 27.5 pg (ref 26.0–34.0)
MCHC: 34.2 g/dL (ref 30.0–36.0)
MCV: 80.4 fL (ref 80.0–100.0)
Platelets: 377 10*3/uL (ref 150–400)
RBC: 3.82 MIL/uL — ABNORMAL LOW (ref 4.22–5.81)
RDW: 18.2 % — ABNORMAL HIGH (ref 11.5–15.5)
WBC: 12.1 10*3/uL — ABNORMAL HIGH (ref 4.0–10.5)
nRBC: 0 % (ref 0.0–0.2)

## 2021-07-12 LAB — BASIC METABOLIC PANEL
Anion gap: 7 (ref 5–15)
BUN: <5 mg/dL — ABNORMAL LOW (ref 6–20)
CO2: 18 mmol/L — ABNORMAL LOW (ref 22–32)
Calcium: 7.8 mg/dL — ABNORMAL LOW (ref 8.9–10.3)
Chloride: 111 mmol/L (ref 98–111)
Creatinine, Ser: 0.58 mg/dL — ABNORMAL LOW (ref 0.61–1.24)
GFR, Estimated: >60 mL/min (ref >60)
Glucose, Bld: 111 mg/dL — ABNORMAL HIGH (ref 70–99)
Potassium: 3.3 mmol/L — ABNORMAL LOW (ref 3.5–5.1)
Sodium: 136 mmol/L (ref 135–145)

## 2021-07-12 LAB — GLUCOSE, CAPILLARY
Glucose-Capillary: 110 mg/dL — ABNORMAL HIGH (ref 70–99)
Glucose-Capillary: 106 mg/dL — ABNORMAL HIGH (ref 70–99)
Glucose-Capillary: 100 mg/dL — ABNORMAL HIGH (ref 70–99)
Glucose-Capillary: 86 mg/dL (ref 70–99)

## 2021-07-12 MED ORDER — POTASSIUM CHLORIDE CRYS ER 20 MEQ PO TBCR
40.0000 meq | EXTENDED_RELEASE_TABLET | Freq: Two times a day (BID) | ORAL | Status: AC
  Administered 2021-07-12 (×2): 40 meq via ORAL
  Filled 2021-07-12 (×2): qty 2
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
## 2021-07-12 NOTE — Progress Notes (Signed)
9 Days Post-Op Procedure(s) (LRB): VIDEO BRONCHOSCOPY (N/A) Subjective: No complaints this AM, denies pain and nausea  Objective: Vital signs in last 24 hours: Temp:  [98.1 F (36.7 C)-98.4 F (36.9 C)] 98.1 F (36.7 C) (10/08 0346) Pulse Rate:  [95-107] 100 (10/08 0346) Cardiac Rhythm: Sinus tachycardia (10/07 1917) Resp:  [16-20] 19 (10/08 0346) BP: (107-137)/(69-88) 123/81 (10/08 0346) SpO2:  [96 %-99 %] 99 % (10/08 0346) Weight:  [64.4 kg] 64.4 kg (10/08 0350)  Hemodynamic parameters for last 24 hours:    Intake/Output from previous day: 10/07 0701 - 10/08 0700 In: 2275.5 [P.O.:180; I.V.:581.3; IV Piggyback:1514.2] Out: 500 [Urine:500] Intake/Output this shift: No intake/output data recorded.  General appearance: alert, cooperative, and no distress Heart: regular rate and rhythm Lungs: diminished breath sounds right base Abdomen: distended and tympanitic but nontender Wound: clean and dry  Lab Results: Recent Labs    07/11/21 0126 07/12/21 0124  WBC 19.3* 12.1*  HGB 9.4* 10.5*  HCT 28.5* 30.7*  PLT 434* 377   BMET:  Recent Labs    07/11/21 0126 07/12/21 0124  NA 137 136  K 2.9* 3.3*  CL 109 111  CO2 18* 18*  GLUCOSE 110* 111*  BUN <5* <5*  CREATININE 0.70 0.58*  CALCIUM 7.7* 7.8*    PT/INR: No results for input(s): LABPROT, INR in the last 72 hours. ABG    Component Value Date/Time   PHART 7.417 07/02/2021 0528   HCO3 20.3 07/02/2021 0528   TCO2 23 07/03/2021 1332   ACIDBASEDEF 3.4 (H) 07/02/2021 0528   O2SAT 98.1 07/02/2021 0528   CBG (last 3)  Recent Labs    07/11/21 1707 07/11/21 2119 07/12/21 0607  GLUCAP 93 98 110*    Assessment/Plan: S/P Procedure(s) (LRB): VIDEO BRONCHOSCOPY (N/A) - Looks well this AM WBC down to 12 K form 19K, afebrile Wounds are OK Abdomen still distended but nontender CXR unchanged from yesterday   LOS: 18 days    [name] 07/12/2021

## 2021-07-12 NOTE — Progress Notes (Signed)
Patient from [place] to red MEWS, 2 for respiratory rate and 2 for heart rate. Charge nurse notified and MD paged. Patient's heart rate is slightly elevated from what it has been by about 10-15 bpm. By 2300, patients HR is back down to 105. I would question the accuracy of 29 respiratory rate. Upon entering room at 2300, patient's respirations are 23. Patient is not in any pain, assessment is unchanged, no change in mental status. Continue to monitor vitals q1h for four hours.     07/12/21 2033  Assess: MEWS Score  Temp 98.4 F (36.9 C)  BP (!) 148/97  Pulse Rate (!) 114  ECG Heart Rate (!) 114  Resp (!) 29  SpO2 100 %  Assess: MEWS Score  MEWS Temp 0  MEWS Systolic 0  MEWS Pulse 2  MEWS RR 2  MEWS LOC 0  MEWS Score 4  MEWS Score Color Red  Assess: if the MEWS score is Yellow or Red  Were vital signs taken at a resting state? Yes  Focused Assessment No change from prior assessment  Early Detection of Sepsis Score *See Row Information* Low  MEWS guidelines implemented *See Row Information* Yes  Treat  MEWS Interventions Escalated (See documentation below)  Pain Scale 0-10  Pain Score 0  Take Vital Signs  Increase Vital Sign Frequency  Red: Q 1hr X 4 then Q 4hr X 4, if remains red, continue Q 4hrs  Escalate  MEWS: Escalate Red: discuss with charge nurse/RN and provider, consider discussing with RRT  Notify: Charge Nurse/RN  Name of Charge Nurse/RN Notified Shiwangi, RN  Date Charge Nurse/RN Notified 07/12/21  Time Charge Nurse/RN Notified 2305

## 2021-07-12 NOTE — Plan of Care (Signed)
  Problem: Clinical Measurements: Goal: Ability to maintain clinical measurements within normal limits will improve Outcome: Progressing Goal: Will remain free from infection Outcome: Progressing   Problem: Coping: Goal: Level of anxiety will decrease Outcome: Progressing   Problem: Elimination: Goal: Will not experience complications related to bowel motility Outcome: Progressing   Problem: Safety: Goal: Ability to remain free from injury will improve Outcome: Progressing   Problem: Cardiac: Goal: Will achieve and/or maintain hemodynamic stability Outcome: Progressing   Problem: Clinical Measurements: Goal: Postoperative complications will be avoided or minimized Outcome: Progressing   Problem: Respiratory: Goal: Respiratory status will improve Outcome: Progressing   Problem: Pain Management: Goal: Pain level will decrease Outcome: Progressing

## 2021-07-13 DIAGNOSIS — N170 Acute kidney failure with tubular necrosis: Secondary | ICD-10-CM | POA: Diagnosis not present

## 2021-07-13 LAB — CBC
HCT: 29.6 % — ABNORMAL LOW (ref 39.0–52.0)
Hemoglobin: 10 g/dL — ABNORMAL LOW (ref 13.0–17.0)
MCH: 27.1 pg (ref 26.0–34.0)
MCHC: 33.8 g/dL (ref 30.0–36.0)
MCV: 80.2 fL (ref 80.0–100.0)
Platelets: 408 10*3/uL — ABNORMAL HIGH (ref 150–400)
RBC: 3.69 MIL/uL — ABNORMAL LOW (ref 4.22–5.81)
RDW: 17.9 % — ABNORMAL HIGH (ref 11.5–15.5)
WBC: 7.6 10*3/uL (ref 4.0–10.5)
nRBC: 0 % (ref 0.0–0.2)

## 2021-07-13 LAB — GLUCOSE, CAPILLARY
Glucose-Capillary: 94 mg/dL (ref 70–99)
Glucose-Capillary: 117 mg/dL — ABNORMAL HIGH (ref 70–99)
Glucose-Capillary: 111 mg/dL — ABNORMAL HIGH (ref 70–99)
Glucose-Capillary: 92 mg/dL (ref 70–99)

## 2021-07-13 LAB — BASIC METABOLIC PANEL
Anion gap: 6 (ref 5–15)
BUN: <5 mg/dL — ABNORMAL LOW (ref 6–20)
CO2: 17 mmol/L — ABNORMAL LOW (ref 22–32)
Calcium: 8 mg/dL — ABNORMAL LOW (ref 8.9–10.3)
Chloride: 114 mmol/L — ABNORMAL HIGH (ref 98–111)
Creatinine, Ser: 0.62 mg/dL (ref 0.61–1.24)
GFR, Estimated: >60 mL/min (ref >60)
Glucose, Bld: 103 mg/dL — ABNORMAL HIGH (ref 70–99)
Potassium: 4.1 mmol/L (ref 3.5–5.1)
Sodium: 137 mmol/L (ref 135–145)

## 2021-07-13 MED ORDER — METOPROLOL TARTRATE 25 MG PO TABS
25.0000 mg | ORAL_TABLET | Freq: Two times a day (BID) | ORAL | Status: DC
  Administered 2021-07-13 – 2021-07-17 (×9): 25 mg via ORAL
  Filled 2021-07-13 (×10): qty 1

## 2021-07-13 MED ORDER — AMOXICILLIN-POT CLAVULANATE 875-125 MG PO TABS
1.0000 | ORAL_TABLET | Freq: Two times a day (BID) | ORAL | Status: DC
  Administered 2021-07-13 – 2021-07-17 (×8): 1 via ORAL
  Filled 2021-07-13 (×9): qty 1

## 2021-07-13 MED ORDER — LINEZOLID 600 MG PO TABS
600.0000 mg | ORAL_TABLET | Freq: Two times a day (BID) | ORAL | Status: DC
  Administered 2021-07-13 – 2021-07-17 (×8): 600 mg via ORAL
  Filled 2021-07-13 (×9): qty 1

## 2021-07-13 MED ORDER — ENOXAPARIN SODIUM 40 MG/0.4ML IJ SOSY
40.0000 mg | PREFILLED_SYRINGE | INTRAMUSCULAR | Status: DC
  Administered 2021-07-13 – 2021-07-17 (×5): 40 mg via SUBCUTANEOUS
  Filled 2021-07-13 (×5): qty 0.4

## 2021-07-13 MED ORDER — ENSURE ENLIVE PO LIQD
237.0000 mL | Freq: Two times a day (BID) | ORAL | Status: DC
  Administered 2021-07-13 – 2021-07-15 (×4): 237 mL via ORAL

## 2021-07-13 NOTE — Progress Notes (Signed)
10 Days Post-Op Procedure(s) (LRB): VIDEO BRONCHOSCOPY (N/A) Subjective: No complaints this AM Denies pain  Objective: Vital signs in last 24 hours: Temp:  [98 F (36.7 C)-99 F (37.2 C)] 98.4 F (36.9 C) (10/09 0750) Pulse Rate:  [105-116] 105 (10/09 0750) Cardiac Rhythm: Sinus tachycardia (10/09 0800) Resp:  [17-29] 20 (10/09 0750) BP: (131-148)/(87-103) 142/98 (10/09 0750) SpO2:  [97 %-100 %] 97 % (10/09 0750) Weight:  [65.6 kg] 65.6 kg (10/09 0321)  Hemodynamic parameters for last 24 hours:    Intake/Output from previous day: 10/08 0701 - 10/09 0700 In: -  Out: 150 [Urine:150] Intake/Output this shift: Total I/O In: 120 [P.O.:120] Out: 100 [Urine:100]  General appearance: alert, cooperative, and no distress Neurologic: intact Heart: tachy Lungs: diminished breath sounds right base  Lab Results: Recent Labs    07/12/21 0124 07/13/21 0739  WBC 12.1* 7.6  HGB 10.5* 10.0*  HCT 30.7* 29.6*  PLT 377 408*   BMET:  Recent Labs    07/11/21 0126 07/12/21 0124  NA 137 136  K 2.9* 3.3*  CL 109 111  CO2 18* 18*  GLUCOSE 110* 111*  BUN <5* <5*  CREATININE 0.70 0.58*  CALCIUM 7.7* 7.8*    PT/INR: No results for input(s): LABPROT, INR in the last 72 hours. ABG    Component Value Date/Time   PHART 7.417 07/02/2021 0528   HCO3 20.3 07/02/2021 0528   TCO2 23 07/03/2021 1332   ACIDBASEDEF 3.4 (H) 07/02/2021 0528   O2SAT 98.1 07/02/2021 0528   CBG (last 3)  Recent Labs    07/12/21 1656 07/12/21 2148 07/13/21 0613  GLUCAP 100* 86 94    Assessment/Plan: S/P Procedure(s) (LRB): VIDEO BRONCHOSCOPY (N/A) - Looks good this AM Still tachycardic and hypertensive, might benefit from beta blocker- will defer to medical team WBC back to normal range Wounds are Ok    LOS: 19 days    [name] 07/13/2021

## 2021-07-13 NOTE — Plan of Care (Signed)
  Problem: Health Behavior/Discharge Planning: Goal: Ability to manage health-related needs will improve Outcome: Progressing   Problem: Clinical Measurements: Goal: Ability to maintain clinical measurements within normal limits will improve Outcome: Progressing Goal: Diagnostic test results will improve Outcome: Progressing   Problem: Nutrition: Goal: Adequate nutrition will be maintained Outcome: Progressing   Problem: Elimination: Goal: Will not experience complications related to bowel motility Outcome: Progressing   Problem: Pain Managment: Goal: General experience of comfort will improve Outcome: Progressing   Problem: Skin Integrity: Goal: Risk for impaired skin integrity will decrease Outcome: Progressing
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
## 2021-07-14 ENCOUNTER — Other Ambulatory Visit: Payer: Self-pay | Admitting: *Deleted

## 2021-07-14 DIAGNOSIS — J869 Pyothorax without fistula: Secondary | ICD-10-CM

## 2021-07-14 DIAGNOSIS — N170 Acute kidney failure with tubular necrosis: Secondary | ICD-10-CM | POA: Diagnosis not present

## 2021-07-14 LAB — BASIC METABOLIC PANEL
Anion gap: 11 (ref 5–15)
BUN: <5 mg/dL — ABNORMAL LOW (ref 6–20)
CO2: 14 mmol/L — ABNORMAL LOW (ref 22–32)
Calcium: 8.3 mg/dL — ABNORMAL LOW (ref 8.9–10.3)
Chloride: 112 mmol/L — ABNORMAL HIGH (ref 98–111)
Creatinine, Ser: 0.7 mg/dL (ref 0.61–1.24)
GFR, Estimated: >60 mL/min (ref >60)
Glucose, Bld: 77 mg/dL (ref 70–99)
Potassium: 4.2 mmol/L (ref 3.5–5.1)
Sodium: 137 mmol/L (ref 135–145)

## 2021-07-14 LAB — MAGNESIUM: Magnesium: 1.4 mg/dL — ABNORMAL LOW (ref 1.7–2.4)

## 2021-07-14 LAB — GLUCOSE, CAPILLARY
Glucose-Capillary: 88 mg/dL (ref 70–99)
Glucose-Capillary: 89 mg/dL (ref 70–99)
Glucose-Capillary: 115 mg/dL — ABNORMAL HIGH (ref 70–99)

## 2021-07-14 MED ORDER — MAGNESIUM SULFATE 2 GM/50ML IV SOLN
2.0000 g | Freq: Once | INTRAVENOUS | Status: AC
  Administered 2021-07-14: 2 g via INTRAVENOUS
  Filled 2021-07-14: qty 50

## 2021-07-14 MED ORDER — LACTATED RINGERS IV SOLN: INTRAVENOUS | Status: DC

## 2021-07-14 MED ORDER — PANTOPRAZOLE SODIUM 40 MG PO TBEC
40.0000 mg | DELAYED_RELEASE_TABLET | Freq: Two times a day (BID) | ORAL | Status: DC
  Administered 2021-07-14 – 2021-07-17 (×7): 40 mg via ORAL
  Filled 2021-07-14 (×8): qty 1

## 2021-07-14 MED ORDER — SODIUM BICARBONATE 650 MG PO TABS
1300.0000 mg | ORAL_TABLET | Freq: Three times a day (TID) | ORAL | Status: DC
  Administered 2021-07-14 – 2021-07-17 (×10): 1300 mg via ORAL
  Filled 2021-07-14 (×11): qty 2

## 2021-07-14 MED ORDER — SODIUM CHLORIDE 0.9 % IV BOLUS: 500.0000 mL | Freq: Once | INTRAVENOUS | Status: DC

## 2021-07-14 MED ORDER — GERHARDT'S BUTT CREAM
TOPICAL_CREAM | Freq: Three times a day (TID) | CUTANEOUS | Status: DC
  Administered 2021-07-14 – 2021-07-15 (×2): 1 via TOPICAL
  Filled 2021-07-14 (×2): qty 1

## 2021-07-14 NOTE — Plan of Care (Signed)
  Problem: Health Behavior/Discharge Planning: Goal: Ability to manage health-related needs will improve Outcome: Progressing   Problem: Clinical Measurements: Goal: Ability to maintain clinical measurements within normal limits will improve Outcome: Progressing Goal: Will remain free from infection Outcome: Progressing Goal: Diagnostic test results will improve Outcome: Progressing   Problem: Activity: Goal: Risk for activity intolerance will decrease Outcome: Progressing   Problem: Nutrition: Goal: Adequate nutrition will be maintained Outcome: Progressing   Problem: Coping: Goal: Level of anxiety will decrease Outcome: Progressing   Problem: Elimination: Goal: Will not experience complications related to bowel motility Outcome: Progressing Goal: Will not experience complications related to urinary retention Outcome: Progressing   Problem: Pain Managment: Goal: General experience of comfort will improve Outcome: Progressing   Problem: Safety: Goal: Ability to remain free from injury will improve Outcome: Progressing   Problem: Skin Integrity: Goal: Risk for impaired skin integrity will decrease Outcome: Progressing   Problem: Education: Goal: Knowledge of disease or condition will improve Outcome: Progressing Goal: Knowledge of the prescribed therapeutic regimen will improve Outcome: Progressing   Problem: Activity: Goal: Risk for activity intolerance will decrease Outcome: Progressing   Problem: Cardiac: Goal: Will achieve and/or maintain hemodynamic stability Outcome: Progressing   Problem: Clinical Measurements: Goal: Postoperative complications will be avoided or minimized Outcome: Progressing   Problem: Respiratory: Goal: Respiratory status will improve Outcome: Progressing   Problem: Pain Management: Goal: Pain level will decrease Outcome: Progressing   Problem: Skin Integrity: Goal: Wound healing without signs and symptoms infection will  improve Outcome: Progressing
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
## 2021-07-14 NOTE — Progress Notes (Addendum)
      [address]       [employer] [address]             [phone]      11 Days Post-Op Procedure(s) (LRB): VIDEO BRONCHOSCOPY (N/A) Subjective:  Alert and conversant, not SOB, no specific c/o  Objective: Vital signs in last 24 hours: Temp:  [98.4 F (36.9 C)-99 F (37.2 C)] 98.8 F (37.1 C) (10/10 0400) Pulse Rate:  [102-110] 110 (10/10 0400) Cardiac Rhythm: Sinus tachycardia (10/09 1908) Resp:  [19-28] 28 (10/10 0400) BP: (122-145)/(79-98) 143/92 (10/10 0400) SpO2:  [96 %-97 %] 97 % (10/10 0400) Weight:  [69.8 kg] 69.8 kg (10/10 0400)  Hemodynamic parameters for last 24 hours:    Intake/Output from previous day: 10/09 0701 - 10/10 0700 In: 480 [P.O.:480] Out: 1200 [Urine:1200] Intake/Output this shift: No intake/output data recorded.  General appearance: alert, cooperative, and no distress Heart: regular rate and rhythm and tachy Lungs: dim right base Abdomen: benign Extremities: minor edema right LE Wound: incis healing well  Lab Results: Recent Labs    07/12/21 0124 07/13/21 0739  WBC 12.1* 7.6  HGB 10.5* 10.0*  HCT 30.7* 29.6*  PLT 377 408*   BMET:  Recent Labs    07/12/21 0124 07/13/21 0739  NA 136 137  K 3.3* 4.1  CL 111 114*  CO2 18* 17*  GLUCOSE 111* 103*  BUN <5* <5*  CREATININE 0.58* 0.62  CALCIUM 7.8* 8.0*    PT/INR: No results for input(s): LABPROT, INR in the last 72 hours. ABG    Component Value Date/Time   PHART 7.417 07/02/2021 0528   HCO3 20.3 07/02/2021 0528   TCO2 23 07/03/2021 1332   ACIDBASEDEF 3.4 (H) 07/02/2021 0528   O2SAT 98.1 07/02/2021 0528   CBG (last 3)  Recent Labs    07/13/21 1544 07/13/21 2106 07/14/21 0618  GLUCAP 111* 92 88    Meds Scheduled Meds:  amoxicillin-clavulanate  1 tablet Oral Q12H   enoxaparin (LOVENOX) injection  40 mg Subcutaneous Q24H   feeding supplement  237 mL Oral BID BM   guaiFENesin  1,200 mg Oral BID   insulin aspart  0-9 Units Subcutaneous TID WC    linezolid  600 mg Oral Q12H   mouth rinse  15 mL Mouth Rinse BID   metoprolol tartrate  25 mg Oral BID   pantoprazole  40 mg Oral BID   polyethylene glycol  17 g Oral BID   tamsulosin  0.4 mg Oral Daily   thiamine  500 mg Oral Daily   vitamin B-12  1,000 mcg Oral Daily   Continuous Infusions:  0.9 % NaCl with KCl 40 mEq / L 100 mL/hr at 07/13/21 2353   PRN Meds:.acetaminophen **OR** acetaminophen, albuterol, alum & mag hydroxide-simeth, labetalol, ondansetron (ZOFRAN) IV, traMADol  Xrays No results found.  Assessment/Plan: S/P Procedure(s) (LRB): VIDEO BRONCHOSCOPY (N/A)  1 Tmax 99, VSS , low level sinus tachy 2 sats good on RA 3 no new labs 4 current abx- continue 5 lovenox for DVT PPX- continue 6 pulm toilet/rehab as able 7 med management as per primary   LOS: 20 days    John Giovanni PA-C Pager [phone] 07/14/2021  Patient seen and examined, agree with exam and assessment as noted above  Remo Lipps C. Roxan Hockey, MD [facility] [phone]

## 2021-07-15 ENCOUNTER — Ambulatory Visit: Payer: Self-pay | Admitting: Thoracic Surgery (Cardiothoracic Vascular Surgery)

## 2021-07-15 DIAGNOSIS — N170 Acute kidney failure with tubular necrosis: Secondary | ICD-10-CM | POA: Diagnosis not present

## 2021-07-15 LAB — CBC
HCT: 25.5 % — ABNORMAL LOW (ref 39.0–52.0)
Hemoglobin: 8.6 g/dL — ABNORMAL LOW (ref 13.0–17.0)
MCH: 26.7 pg (ref 26.0–34.0)
MCHC: 33.7 g/dL (ref 30.0–36.0)
MCV: 79.2 fL — ABNORMAL LOW (ref 80.0–100.0)
Platelets: 359 10*3/uL (ref 150–400)
RBC: 3.22 MIL/uL — ABNORMAL LOW (ref 4.22–5.81)
RDW: 17.3 % — ABNORMAL HIGH (ref 11.5–15.5)
WBC: 8.8 10*3/uL (ref 4.0–10.5)
nRBC: 0 % (ref 0.0–0.2)

## 2021-07-15 LAB — BASIC METABOLIC PANEL
Anion gap: 6 (ref 5–15)
BUN: <5 mg/dL — ABNORMAL LOW (ref 6–20)
CO2: 18 mmol/L — ABNORMAL LOW (ref 22–32)
Calcium: 7.6 mg/dL — ABNORMAL LOW (ref 8.9–10.3)
Chloride: 108 mmol/L (ref 98–111)
Creatinine, Ser: 0.69 mg/dL (ref 0.61–1.24)
GFR, Estimated: >60 mL/min (ref >60)
Glucose, Bld: 120 mg/dL — ABNORMAL HIGH (ref 70–99)
Potassium: 3.3 mmol/L — ABNORMAL LOW (ref 3.5–5.1)
Sodium: 132 mmol/L — ABNORMAL LOW (ref 135–145)

## 2021-07-15 LAB — GLUCOSE, CAPILLARY
Glucose-Capillary: 93 mg/dL (ref 70–99)
Glucose-Capillary: 109 mg/dL — ABNORMAL HIGH (ref 70–99)
Glucose-Capillary: 89 mg/dL (ref 70–99)
Glucose-Capillary: 109 mg/dL — ABNORMAL HIGH (ref 70–99)

## 2021-07-15 LAB — MAGNESIUM: Magnesium: 1.6 mg/dL — ABNORMAL LOW (ref 1.7–2.4)

## 2021-07-15 MED ORDER — POTASSIUM CHLORIDE CRYS ER 20 MEQ PO TBCR
40.0000 meq | EXTENDED_RELEASE_TABLET | Freq: Two times a day (BID) | ORAL | Status: DC
  Filled 2021-07-15: qty 2

## 2021-07-15 MED ORDER — ENSURE ENLIVE PO LIQD
237.0000 mL | Freq: Three times a day (TID) | ORAL | Status: DC
  Administered 2021-07-15 – 2021-07-17 (×7): 237 mL via ORAL

## 2021-07-15 MED ORDER — MAGNESIUM SULFATE 2 GM/50ML IV SOLN
2.0000 g | Freq: Once | INTRAVENOUS | Status: AC
  Administered 2021-07-15: 2 g via INTRAVENOUS
  Filled 2021-07-15: qty 50

## 2021-07-15 MED ORDER — POTASSIUM CHLORIDE 20 MEQ PO PACK
40.0000 meq | PACK | Freq: Two times a day (BID) | ORAL | Status: AC
  Administered 2021-07-15 (×2): 40 meq via ORAL
  Filled 2021-07-15 (×2): qty 2
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
## 2021-07-15 NOTE — Progress Notes (Signed)
      [address]       [place] [address]             [phone]       12 Days Post-Op Procedure(s) (LRB): VIDEO BRONCHOSCOPY (N/A)  Subjective:  When asked if his breathing is doing well he said yes. Patient denies abdominal pain this am.  Objective: Vital signs in last 24 hours: Temp:  [97.9 F (36.6 C)-99.7 F (37.6 C)] 98.4 F (36.9 C) (10/11 0550) Pulse Rate:  [100-124] 106 (10/11 0550) Cardiac Rhythm: Sinus tachycardia (10/11 0400) Resp:  [20-28] 21 (10/11 0550) BP: (103-133)/(64-93) 103/64 (10/11 0550) SpO2:  [91 %-98 %] 91 % (10/11 0550) Weight:  [69.1 kg] 69.1 kg (10/11 0500)     Intake/Output from previous day: 10/10 0701 - 10/11 0700 In: 1784.3 [P.O.:734; I.V.:1000.3; IV Piggyback:50] Out: 3212 [Urine:1175]   Physical Exam:  Cardiovascular: Slightly tachycardic Pulmonary: Clear to auscultation on left and slightly diminished right basilar breath sounds Abdomen: Soft, non tender, sporadic bowel sounds Extremities: No RLE edema Wounds: Clean and dry.  No erythema or signs of infection.    Lab Results: CBC: Recent Labs    07/13/21 0739 07/15/21 0018  WBC 7.6 8.8  HGB 10.0* 8.6*  HCT 29.6* 25.5*  PLT 408* 359    BMET:  Recent Labs    07/14/21 0826 07/15/21 0018  NA 137 132*  K 4.2 3.3*  CL 112* 108  CO2 14* 18*  GLUCOSE 77 120*  BUN <5* <5*  CREATININE 0.70 0.69  CALCIUM 8.3* 7.6*     PT/INR:  No results for input(s): LABPROT, INR in the last 72 hours.  ABG:  INR: Will add last result for INR, ABG once components are confirmed Will add last 4 CBG results once components are confirmed  Assessment/Plan:  1. CV - Tachy at times. On Lopressor 25 mg bid. 2.  Pulmonary - On room air. CXR this am appear stable (improving right opacity, no pneumothorax). Encourage incentive spirometer 3. DM-CBGs 89/115/93. On Insulin. Will restart Metformin at discharge. Pre op HGA1C 6. 4. ID-On Augmentin and Linezolid for right  empyema. Right pleural culture showed Strep Sanguinis and Diphtheroids.  5. Expected post op blood loss anemia-H and H  this am decreased to 8.6 and 25.5 (? If correct). Per medicine 6. Supplement potassium 7. Management per primary.   Keela Rubert M ZimmermanPA-C 07/15/2021,6:58 AM

## 2021-07-15 NOTE — Plan of Care (Signed)
  Problem: Clinical Measurements: Goal: Will remain free from infection Outcome: Progressing Goal: Diagnostic test results will improve Outcome: Progressing   Problem: Elimination: Goal: Will not experience complications related to urinary retention Outcome: Progressing   Problem: Clinical Measurements: Goal: Postoperative complications will be avoided or minimized Outcome: Progressing   Problem: Respiratory: Goal: Respiratory status will improve Outcome: Progressing   Problem: Pain Management: Goal: Pain level will decrease Outcome: Progressing   Problem: Skin Integrity: Goal: Wound healing without signs and symptoms infection will improve Outcome: Progressing   Problem: Health Behavior/Discharge Planning: Goal: Ability to manage health-related needs will improve Outcome: Not Progressing   Problem: Clinical Measurements: Goal: Ability to maintain clinical measurements within normal limits will improve Outcome: Not Progressing   Problem: Activity: Goal: Risk for activity intolerance will decrease Outcome: Not Progressing   Problem: Nutrition: Goal: Adequate nutrition will be maintained Outcome: Not Progressing   Problem: Coping: Goal: Level of anxiety will decrease Outcome: Not Progressing   Problem: Elimination: Goal: Will not experience complications related to bowel motility Outcome: Not Progressing   Problem: Pain Managment: Goal: General experience of comfort will improve Outcome: Not Progressing   Problem: Safety: Goal: Ability to remain free from injury will improve Outcome: Not Progressing   Problem: Skin Integrity: Goal: Risk for impaired skin integrity will decrease Outcome: Not Progressing   Problem: Education: Goal: Knowledge of disease or condition will improve Outcome: Not Progressing Goal: Knowledge of the prescribed therapeutic regimen will improve Outcome: Not Progressing   Problem: Activity: Goal: Risk for activity intolerance  will decrease Outcome: Not Progressing   Problem: Cardiac: Goal: Will achieve and/or maintain hemodynamic stability Outcome: Not Progressing
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
## 2021-07-16 DIAGNOSIS — N170 Acute kidney failure with tubular necrosis: Secondary | ICD-10-CM | POA: Diagnosis not present

## 2021-07-16 LAB — GLUCOSE, CAPILLARY
Glucose-Capillary: 104 mg/dL — ABNORMAL HIGH (ref 70–99)
Glucose-Capillary: 98 mg/dL (ref 70–99)
Glucose-Capillary: 119 mg/dL — ABNORMAL HIGH (ref 70–99)
Glucose-Capillary: 109 mg/dL — ABNORMAL HIGH (ref 70–99)

## 2021-07-16 LAB — BASIC METABOLIC PANEL
Anion gap: 7 (ref 5–15)
BUN: <5 mg/dL — ABNORMAL LOW (ref 6–20)
CO2: 20 mmol/L — ABNORMAL LOW (ref 22–32)
Calcium: 7.6 mg/dL — ABNORMAL LOW (ref 8.9–10.3)
Chloride: 110 mmol/L (ref 98–111)
Creatinine, Ser: 0.74 mg/dL (ref 0.61–1.24)
GFR, Estimated: >60 mL/min (ref >60)
Glucose, Bld: 125 mg/dL — ABNORMAL HIGH (ref 70–99)
Potassium: 3.6 mmol/L (ref 3.5–5.1)
Sodium: 137 mmol/L (ref 135–145)

## 2021-07-16 LAB — MAGNESIUM: Magnesium: 1.5 mg/dL — ABNORMAL LOW (ref 1.7–2.4)

## 2021-07-16 LAB — SARS CORONAVIRUS 2 (TAT 6-24 HRS): SARS Coronavirus 2: NEGATIVE

## 2021-07-16 MED ORDER — MAGNESIUM SULFATE 2 GM/50ML IV SOLN
2.0000 g | Freq: Once | INTRAVENOUS | Status: AC
  Administered 2021-07-16: 2 g via INTRAVENOUS
  Filled 2021-07-16: qty 50

## 2021-07-16 NOTE — Plan of Care (Signed)
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
## 2021-07-16 NOTE — TOC Progression Note (Signed)
Transition of Care [facility]) - Progression Note    Patient Details  Name: Evan Chambers MRN: [id number] Date of Birth: 07-04-1961  Transition of Care [facility]) CM/SW Contact  Reece Agar, [place] Phone Number: 07/16/2021, 2:49 PM  Clinical Narrative:    CSW spoke with [name] at [facility] to follow up on pt auth. [name] stated that pt updated PT/OT notes were updated at 10:30 this morning.   CSW followed up again with [name] about Karyl Kinnier stated she still has not heard anything on auth and will follow up when she receives confirmation.    Expected Discharge Plan: Skilled Nursing Facility Barriers to Discharge: Active Substance Use - Placement, Insurance Authorization  Expected Discharge Plan and Services Expected Discharge Plan: [place] In-house Referral: Clinical Social Work Discharge Planning Services: NA Post Acute Care Choice: [place] Living arrangements for the past 2 months: Single Family Home Expected Discharge Date: 07/16/21                                     Social Determinants of Health (SDOH) Interventions    Readmission Risk Interventions No flowsheet data found.

## 2021-07-16 NOTE — Progress Notes (Addendum)
[address]       [employer] [address]             [phone]      13 Days Post-Op Procedure(s) (LRB): VIDEO BRONCHOSCOPY (N/A) Subjective: Feels fine, no SOB, no specific c/o  Objective: Vital signs in last 24 hours: Temp:  [97.7 F (36.5 C)-99 F (37.2 C)] 98.3 F (36.8 C) (10/12 0351) Pulse Rate:  [100-115] 115 (10/11 1932) Cardiac Rhythm: Sinus tachycardia (10/12 0350) Resp:  [18-23] 18 (10/12 0351) BP: (101-131)/(70-83) 123/83 (10/12 0351) SpO2:  [92 %-96 %] 92 % (10/12 0351) Weight:  [65.9 kg] 65.9 kg (10/12 0351)  Hemodynamic parameters for last 24 hours:    Intake/Output from previous day: 10/11 0701 - 10/12 0700 In: 2507.2 [P.O.:951; I.V.:1506.2; IV Piggyback:50] Out: 2000 [Urine:2000] Intake/Output this shift: No intake/output data recorded.  General appearance: alert, cooperative, distracted, and no distress Heart: regular rate and rhythm and tachy Lungs: dim right >left base Abdomen: soft, non tender, minor distension, + BS Extremities: minor edema right leg, no calf  tenderness, L AKA Wound: incis healing well  Lab Results: Recent Labs    07/13/21 0739 07/15/21 0018  WBC 7.6 8.8  HGB 10.0* 8.6*  HCT 29.6* 25.5*  PLT 408* 359   BMET:  Recent Labs    07/15/21 0018 07/16/21 0124  NA 132* 137  K 3.3* 3.6  CL 108 110  CO2 18* 20*  GLUCOSE 120* 125*  BUN <5* <5*  CREATININE 0.69 0.74  CALCIUM 7.6* 7.6*    PT/INR: No results for input(s): LABPROT, INR in the last 72 hours. ABG    Component Value Date/Time   PHART 7.417 07/02/2021 0528   HCO3 20.3 07/02/2021 0528   TCO2 23 07/03/2021 1332   ACIDBASEDEF 3.4 (H) 07/02/2021 0528   O2SAT 98.1 07/02/2021 0528   CBG (last 3)  Recent Labs    07/15/21 1622 07/15/21 2128 07/16/21 0625  GLUCAP 89 109* 104*    Meds Scheduled Meds:  amoxicillin-clavulanate  1 tablet Oral Q12H   enoxaparin (LOVENOX) injection  40 mg Subcutaneous Q24H   feeding supplement  237 mL  Oral TID BM   Gerhardt's butt cream   Topical TID   guaiFENesin  1,200 mg Oral BID   insulin aspart  0-9 Units Subcutaneous TID WC   linezolid  600 mg Oral Q12H   mouth rinse  15 mL Mouth Rinse BID   metoprolol tartrate  25 mg Oral BID   pantoprazole  40 mg Oral BID   polyethylene glycol  17 g Oral BID   sodium bicarbonate  1,300 mg Oral TID   tamsulosin  0.4 mg Oral Daily   thiamine  500 mg Oral Daily   vitamin B-12  1,000 mcg Oral Daily   Continuous Infusions:  lactated ringers 100 mL/hr at 07/16/21 0000   sodium chloride 500 mL/hr at 07/14/21 1703   PRN Meds:.acetaminophen **OR** acetaminophen, albuterol, alum & mag hydroxide-simeth, labetalol, ondansetron (ZOFRAN) IV, traMADol  Xrays No results found.  Results for orders placed or performed during the hospital encounter of 06/23/21  Resp Panel by RT-PCR (Flu A&B, Covid) Nasopharyngeal Swab     Status: None   Collection Time: 06/23/21  7:24 PM   Specimen: Nasopharyngeal Swab; Nasopharyngeal(NP) swabs in vial transport medium  Result Value Ref Range Status   SARS Coronavirus 2 by RT PCR NEGATIVE NEGATIVE Final    Comment: (NOTE) SARS-CoV-2 target nucleic acids are NOT DETECTED.  The SARS-CoV-2 RNA is generally detectable in upper respiratory specimens during the acute phase of infection. The lowest concentration of SARS-CoV-2 viral copies this assay can detect is 138 copies/mL. A negative result does not preclude SARS-Cov-2 infection and should not be used as the sole basis for treatment or other patient management decisions. A negative result may occur with  improper specimen collection/handling, submission of specimen other than nasopharyngeal swab, presence of viral mutation(s) within the areas targeted by this assay, and inadequate number of viral copies(<138 copies/mL). A negative result must be combined with clinical observations, patient history, and epidemiological information. The expected result is  Negative.  Fact Sheet for Patients:  EntrepreneurPulse.com.au  Fact Sheet for Healthcare Providers:  IncredibleEmployment.be  This test is no t yet approved or cleared by the [place] FDA and  has been authorized for detection and/or diagnosis of SARS-CoV-2 by FDA under an Emergency Use Authorization (EUA). This EUA will remain  in effect (meaning this test can be used) for the duration of the COVID-19 declaration under Section 564(b)(1) of the Act, 21 U.S.C.section 360bbb-3(b)(1), unless the authorization is terminated  or revoked sooner.       Influenza A by PCR NEGATIVE NEGATIVE Final   Influenza B by PCR NEGATIVE NEGATIVE Final    Comment: (NOTE) The Xpert Xpress SARS-CoV-2/FLU/RSV plus assay is intended as an aid in the diagnosis of influenza from Nasopharyngeal swab specimens and should not be used as a sole basis for treatment. Nasal washings and aspirates are unacceptable for Xpert Xpress SARS-CoV-2/FLU/RSV testing.  Fact Sheet for Patients: EntrepreneurPulse.com.au  Fact Sheet for Healthcare Providers: IncredibleEmployment.be  This test is not yet approved or cleared by the [place] FDA and has been authorized for detection and/or diagnosis of SARS-CoV-2 by FDA under an Emergency Use Authorization (EUA). This EUA will remain in effect (meaning this test can be used) for the duration of the COVID-19 declaration under Section 564(b)(1) of the Act, 21 U.S.C. section 360bbb-3(b)(1), unless the authorization is terminated or revoked.  Performed at [facility], [place] [address], [place], [place] [address]   Culture, blood (routine x 2)     Status: None   Collection Time: 06/27/21 12:21 PM   Specimen: BLOOD LEFT ARM  Result Value Ref Range Status   Specimen Description BLOOD LEFT ARM  Final   Special Requests   Final    BOTTLES DRAWN AEROBIC AND ANAEROBIC Blood Culture adequate  volume   Culture   Final    NO GROWTH 5 DAYS Performed at [facility], [address] [place], [place] [address]    Report Status 07/02/2021 FINAL  Final  Culture, blood (routine x 2)     Status: None   Collection Time: 06/27/21 12:25 PM   Specimen: BLOOD LEFT HAND  Result Value Ref Range Status   Specimen Description BLOOD LEFT HAND  Final   Special Requests   Final    BOTTLES DRAWN AEROBIC AND ANAEROBIC Blood Culture adequate volume   Culture   Final    NO GROWTH 5 DAYS Performed at [facility], [place] [address] [place], [place] [address]    Report Status 07/02/2021 FINAL  Final  Body fluid culture w Gram Stain     Status: None   Collection Time: 06/29/21 10:58 AM   Specimen: Lung, Right; Pleural Fluid  Result Value Ref Range Status   Specimen Description PLEURAL FLUID  Final   Special Requests LUNG RIGHT  Final  Gram Stain   Final    FEW WBC PRESENT,BOTH PMN AND MONONUCLEAR ABUNDANT GRAM NEGATIVE RODS ABUNDANT GRAM POSITIVE COCCI Performed at [facility], [place] [address] [place], [place] [address]    Culture   Final    MIXED ANAEROBIC FLORA PRESENT.  CALL LAB IF FURTHER IID REQUIRED.   Report Status 07/02/2021 FINAL  Final  MRSA Next Gen by PCR, Nasal     Status: None   Collection Time: 06/30/21  2:00 PM   Specimen: Nasal Mucosa; Nasal Swab  Result Value Ref Range Status   MRSA by PCR Next Gen NOT DETECTED NOT DETECTED Final    Comment: (NOTE) The GeneXpert MRSA Assay (FDA approved for NASAL specimens only), is one component of a comprehensive MRSA colonization surveillance program. It is not intended to diagnose MRSA infection nor to guide or monitor treatment for MRSA infections. Test performance is not FDA approved in patients less than 80 years old. Performed at [facility], [place] [address], [place], [place] [address]   Aerobic/Anaerobic Culture w Gram Stain (surgical/deep wound)     Status: None   Collection Time: 07/01/21  9:10 AM    Specimen: Pleural, Right; Body Fluid  Result Value Ref Range Status   Specimen Description PLEURAL RIGHT  Final   Special Requests SAMPLE A VANCOMYCIN ZOSYN  Final   Gram Stain   Final    ABUNDANT WBC PRESENT, PREDOMINANTLY PMN ABUNDANT GRAM POSITIVE COCCI IN PAIRS AND CHAINS MODERATE GRAM VARIABLE ROD Performed at [facility], [place] [address], [place], [place] [address]    Culture   Final    MIXED ANAEROBIC FLORA PRESENT.  CALL LAB IF FURTHER IID REQUIRED.   Report Status 07/03/2021 FINAL  Final  Aerobic/Anaerobic Culture w Gram Stain (surgical/deep wound)     Status: None   Collection Time: 07/01/21  9:14 AM   Specimen: Pleural, Right; Body Fluid  Result Value Ref Range Status   Specimen Description PLEURAL RIGHT  Final   Special Requests SAMPLE B VANCOMYCIN ZOSYN  Final   Gram Stain   Final    RARE WBC PRESENT,BOTH PMN AND MONONUCLEAR NO ORGANISMS SEEN    Culture   Final    No growth aerobically or anaerobically. Performed at [facility], [place] [address] [place], [place] [address]    Report Status 07/06/2021 FINAL  Final  Aerobic/Anaerobic Culture w Gram Stain (surgical/deep wound)     Status: None   Collection Time: 07/01/21  9:15 AM   Specimen: Pleural, Right; Body Fluid  Result Value Ref Range Status   Specimen Description PLEURAL RIGHT  Final   Special Requests SAMPLE C VANCOMYCIN ZOSYN  Final   Gram Stain   Final    RARE WBC PRESENT,BOTH PMN AND MONONUCLEAR NO ORGANISMS SEEN    Culture   Final    FEW DIPHTHEROIDS(CORYNEBACTERIUM SPECIES) Standardized susceptibility testing for this organism is not available. NO ANAEROBES ISOLATED Performed at [facility], [place] [address], [place], [place] [address]    Report Status 07/06/2021 FINAL  Final  Aerobic/Anaerobic Culture w Gram Stain (surgical/deep wound)     Status: None   Collection Time: 07/01/21  9:21 AM   Specimen: Pleural, Right; Body Fluid  Result Value Ref Range Status   Specimen  Description PLEURAL RIGHT  Final   Special Requests SAMPLE D VANCOMYCIN ZOSYN  Final   Gram Stain   Final    RARE WBC PRESENT,BOTH PMN AND MONONUCLEAR  FEW GRAM POSITIVE COCCI RARE GRAM VARIABLE ROD    Culture   Final    FEW STREPTOCOCCUS SANGUINIS FEW DIPHTHEROIDS(CORYNEBACTERIUM SPECIES) Standardized susceptibility testing for this organism is not available. NO ANAEROBES ISOLATED Performed at [facility], [place] [address] [place], [place] [address]    Report Status 07/06/2021 FINAL  Final  Anaerobic culture w Gram Stain     Status: None   Collection Time: 07/03/21  2:08 PM   Specimen: Bronchial Washing, Right; Respiratory  Result Value Ref Range Status   Specimen Description BRONCHIAL WASHINGS  Final   Special Requests PT ON VANC,ZOSYN  Final   Gram Stain   Final    ABUNDANT WBC PRESENT, PREDOMINANTLY MONONUCLEAR RARE GRAM POSITIVE COCCI    Culture   Final    NO ANAEROBES ISOLATED Performed at [facility], [place] [address] [place], [place] [address]    Report Status 07/08/2021 FINAL  Final  Culture, Respiratory w Gram Stain     Status: None   Collection Time: 07/03/21  2:08 PM   Specimen: Bronchial Wash  Result Value Ref Range Status   Specimen Description BRONCHIAL WASHINGS  Final   Special Requests PT ON VANC,ZOSYN  Final   Gram Stain   Final    NO ORGANISMS SEEN SQUAMOUS EPITHELIAL CELLS PRESENT ABUNDANT WBC PRESENT, PREDOMINANTLY MONONUCLEAR RARE GRAM POSITIVE COCCI Performed at [facility], [place] [address], [place], [place] [address]    Culture RARE CANDIDA KRUSEI  Final   Report Status 07/07/2021 FINAL  Final  SARS CORONAVIRUS 2 (TAT 6-24 HRS) Nasopharyngeal Nasopharyngeal Swab     Status: None   Collection Time: 07/15/21 10:59 AM   Specimen: Nasopharyngeal Swab  Result Value Ref Range Status   SARS Coronavirus 2 NEGATIVE NEGATIVE Final    Comment: (NOTE) SARS-CoV-2 target nucleic acids are NOT DETECTED.  The SARS-CoV-2 RNA is generally  detectable in upper and lower respiratory specimens during the acute phase of infection. Negative results do not preclude SARS-CoV-2 infection, do not rule out co-infections with other pathogens, and should not be used as the sole basis for treatment or other patient management decisions. Negative results must be combined with clinical observations, patient history, and epidemiological information. The expected result is Negative.  Fact Sheet for Patients: SugarRoll.be  Fact Sheet for Healthcare Providers: https://www.woods-mathews.com/  This test is not yet approved or cleared by the [place] FDA and  has been authorized for detection and/or diagnosis of SARS-CoV-2 by FDA under an Emergency Use Authorization (EUA). This EUA will remain  in effect (meaning this test can be used) for the duration of the COVID-19 declaration under Se ction 564(b)(1) of the Act, 21 U.S.C. section 360bbb-3(b)(1), unless the authorization is terminated or revoked sooner.  Performed at [facility], [place] [address] [place], [place] [address]      Assessment/Plan: S/P Procedure(s) (LRB): VIDEO BRONCHOSCOPY (N/A)   1 afeb, Tmax 99, sinus tachy 2 sats ok on RA 3 renal fxn is normal 4 BS controlled 5 6 week abx course as outlined, augmentin and zyvox po 6 awaits SNF transfer 7 remoce CT sutures    LOS: 22 days    John Giovanni 07/16/2021   Patient seen and examined, agree with above  Remo Lipps C. Roxan Hockey, MD [facility] [phone]

## 2021-07-17 DIAGNOSIS — R69 Illness, unspecified: Secondary | ICD-10-CM | POA: Diagnosis not present

## 2021-07-17 DIAGNOSIS — E872 Acidosis, unspecified: Secondary | ICD-10-CM | POA: Diagnosis not present

## 2021-07-17 DIAGNOSIS — R5381 Other malaise: Secondary | ICD-10-CM | POA: Diagnosis not present

## 2021-07-17 DIAGNOSIS — N179 Acute kidney failure, unspecified: Secondary | ICD-10-CM | POA: Diagnosis not present

## 2021-07-17 DIAGNOSIS — R7309 Other abnormal glucose: Secondary | ICD-10-CM | POA: Diagnosis not present

## 2021-07-17 DIAGNOSIS — R1312 Dysphagia, oropharyngeal phase: Secondary | ICD-10-CM | POA: Diagnosis not present

## 2021-07-17 DIAGNOSIS — R262 Difficulty in walking, not elsewhere classified: Secondary | ICD-10-CM | POA: Diagnosis not present

## 2021-07-17 DIAGNOSIS — Z7401 Bed confinement status: Secondary | ICD-10-CM | POA: Diagnosis not present

## 2021-07-17 DIAGNOSIS — J690 Pneumonitis due to inhalation of food and vomit: Secondary | ICD-10-CM | POA: Diagnosis not present

## 2021-07-17 DIAGNOSIS — N170 Acute kidney failure with tubular necrosis: Secondary | ICD-10-CM | POA: Diagnosis not present

## 2021-07-17 DIAGNOSIS — R1310 Dysphagia, unspecified: Secondary | ICD-10-CM | POA: Diagnosis not present

## 2021-07-17 DIAGNOSIS — R2689 Other abnormalities of gait and mobility: Secondary | ICD-10-CM | POA: Diagnosis not present

## 2021-07-17 DIAGNOSIS — E440 Moderate protein-calorie malnutrition: Secondary | ICD-10-CM | POA: Diagnosis not present

## 2021-07-17 DIAGNOSIS — M6281 Muscle weakness (generalized): Secondary | ICD-10-CM | POA: Diagnosis not present

## 2021-07-17 DIAGNOSIS — R109 Unspecified abdominal pain: Secondary | ICD-10-CM | POA: Diagnosis not present

## 2021-07-17 DIAGNOSIS — R531 Weakness: Secondary | ICD-10-CM | POA: Diagnosis not present

## 2021-07-17 DIAGNOSIS — J869 Pyothorax without fistula: Secondary | ICD-10-CM | POA: Diagnosis not present

## 2021-07-17 DIAGNOSIS — I152 Hypertension secondary to endocrine disorders: Secondary | ICD-10-CM | POA: Diagnosis not present

## 2021-07-17 DIAGNOSIS — J1081 Influenza due to other identified influenza virus with encephalopathy: Secondary | ICD-10-CM | POA: Diagnosis not present

## 2021-07-17 DIAGNOSIS — Z8546 Personal history of malignant neoplasm of prostate: Secondary | ICD-10-CM | POA: Diagnosis not present

## 2021-07-17 DIAGNOSIS — E119 Type 2 diabetes mellitus without complications: Secondary | ICD-10-CM | POA: Diagnosis not present

## 2021-07-17 DIAGNOSIS — E1159 Type 2 diabetes mellitus with other circulatory complications: Secondary | ICD-10-CM | POA: Diagnosis not present

## 2021-07-17 DIAGNOSIS — J9811 Atelectasis: Secondary | ICD-10-CM | POA: Diagnosis not present

## 2021-07-17 DIAGNOSIS — I10 Essential (primary) hypertension: Secondary | ICD-10-CM | POA: Diagnosis not present

## 2021-07-17 DIAGNOSIS — E876 Hypokalemia: Secondary | ICD-10-CM | POA: Diagnosis not present

## 2021-07-17 DIAGNOSIS — D500 Iron deficiency anemia secondary to blood loss (chronic): Secondary | ICD-10-CM | POA: Diagnosis not present

## 2021-07-17 DIAGNOSIS — R780 Finding of alcohol in blood: Secondary | ICD-10-CM | POA: Diagnosis not present

## 2021-07-17 DIAGNOSIS — E7849 Other hyperlipidemia: Secondary | ICD-10-CM | POA: Diagnosis not present

## 2021-07-17 DIAGNOSIS — R41841 Cognitive communication deficit: Secondary | ICD-10-CM | POA: Diagnosis not present

## 2021-07-17 DIAGNOSIS — G9341 Metabolic encephalopathy: Secondary | ICD-10-CM | POA: Diagnosis not present

## 2021-07-17 DIAGNOSIS — E083211 Diabetes mellitus due to underlying condition with mild nonproliferative diabetic retinopathy with macular edema, right eye: Secondary | ICD-10-CM | POA: Diagnosis not present

## 2021-07-17 DIAGNOSIS — G9340 Encephalopathy, unspecified: Secondary | ICD-10-CM | POA: Diagnosis not present

## 2021-07-17 DIAGNOSIS — E875 Hyperkalemia: Secondary | ICD-10-CM | POA: Diagnosis not present

## 2021-07-17 DIAGNOSIS — S78112A Complete traumatic amputation at level between left hip and knee, initial encounter: Secondary | ICD-10-CM | POA: Diagnosis not present

## 2021-07-17 DIAGNOSIS — E512 Wernicke's encephalopathy: Secondary | ICD-10-CM | POA: Diagnosis not present

## 2021-07-17 LAB — GLUCOSE, CAPILLARY
Glucose-Capillary: 97 mg/dL (ref 70–99)
Glucose-Capillary: 107 mg/dL — ABNORMAL HIGH (ref 70–99)
Glucose-Capillary: 118 mg/dL — ABNORMAL HIGH (ref 70–99)

## 2021-07-17 MED ORDER — AMOXICILLIN-POT CLAVULANATE 875-125 MG PO TABS: 1.0000 | ORAL_TABLET | Freq: Two times a day (BID) | ORAL | 0 refills | Status: DC

## 2021-07-17 MED ORDER — LINEZOLID 600 MG PO TABS: 600.0000 mg | ORAL_TABLET | Freq: Two times a day (BID) | ORAL | 0 refills | Status: AC

## 2021-07-17 MED ORDER — METOPROLOL TARTRATE 25 MG PO TABS: 25.0000 mg | ORAL_TABLET | Freq: Two times a day (BID) | ORAL | 0 refills | Status: DC

## 2021-07-17 NOTE — Progress Notes (Signed)
Patient via PTAR to [place], RN has been unable to call report

## 2021-07-17 NOTE — TOC Transition Note (Signed)
Transition of Care [facility]) - CM/SW Discharge Note   Patient Details  Name: LINELL SHAWN MRN: [id number] Date of Birth: 1961/03/20  [facility]) CM/SW Contact:  Tresa Endo Phone Number: 07/17/2021, 4:47 PM   Clinical Narrative:    Patient will DC to: [facility] Anticipated DC date: 07/17/2021 Family notified: Pt daughter Transport by: Corey Harold   Per MD patient ready for DC to [facility] room 104. RN to call report prior to discharge [phone]). RN, patient, patient's family, and facility notified of DC. Discharge Summary and FL2 sent to facility. DC packet on chart. Ambulance transport requested for patient.   CSW will sign off for now as social work intervention is no longer needed. Please consult [place] again if new needs arise.     Final next level of care: Skilled Nursing Facility Barriers to Discharge: Active Substance Use - Placement, Insurance Authorization   Patient Goals and CMS Choice     Choice offered to / list presented to :  (Daughter Theatre manager)  Discharge Placement                       Discharge Plan and Services In-house Referral: Clinical Social Work Discharge Planning Services: NA Post Acute Care Choice: Skilled Nursing Facility                               Social Determinants of Health (SDOH) Interventions     Readmission Risk Interventions No flowsheet data found.

## 2021-07-17 NOTE — Progress Notes (Signed)
      [address]       [place] [address]             [phone]       14 Days Post-Op Procedure(s) (LRB): VIDEO BRONCHOSCOPY (N/A)  Subjective:  Patient sleeping and awakened. He denies shortness of breath  Objective: Vital signs in last 24 hours: Temp:  [98.3 F (36.8 C)-99.2 F (37.3 C)] 98.6 F (37 C) (10/13 0403) Pulse Rate:  [103-109] 107 (10/13 0403) Cardiac Rhythm: Sinus tachycardia (10/13 0403) Resp:  [19-22] 20 (10/13 0403) BP: (113-132)/(73-84) 132/84 (10/13 0403) SpO2:  [93 %-96 %] 94 % (10/13 0403) Weight:  [65.5 kg] 65.5 kg (10/13 0500)     Intake/Output from previous day: 10/12 0701 - 10/13 0700 In: 3431.3 [P.O.:720; I.V.:2661.3; IV Piggyback:50] Out: 9758 [Urine:1850]   Physical Exam:  Cardiovascular: Slightly tachycardic Pulmonary: Clear to auscultation on left and slightly diminished right basilar breath sounds Abdomen: Soft, non tender, bowel sounds present Extremities: No RLE edema Wounds: Clean and dry.  No erythema or signs of infection.    Lab Results: CBC: Recent Labs    07/15/21 0018  WBC 8.8  HGB 8.6*  HCT 25.5*  PLT 359    BMET:  Recent Labs    07/15/21 0018 07/16/21 0124  NA 132* 137  K 3.3* 3.6  CL 108 110  CO2 18* 20*  GLUCOSE 120* 125*  BUN <5* <5*  CREATININE 0.69 0.74  CALCIUM 7.6* 7.6*     PT/INR:  No results for input(s): LABPROT, INR in the last 72 hours.  ABG:  INR: Will add last result for INR, ABG once components are confirmed Will add last 4 CBG results once components are confirmed  Assessment/Plan:  1. CV - Tachy at times. On Lopressor 25 mg bid. 2.  Pulmonary - On room air. Encourage incentive spirometer 3. DM-CBGs 119/109/97. On Insulin. Will restart Metformin at discharge. Pre op HGA1C 6. 4. ID-On Augmentin and Linezolid for right empyema. Right pleural culture showed Strep Sanguinis and Diphtheroids.  5. Expected post op blood loss anemia-Last H and H  8.6 and  25.5. 6. To SNF when bed available  Ammanda Dobbins M ZimmermanPA-C 07/17/2021,6:56 AM
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
## 2021-07-17 NOTE — Plan of Care (Signed)
  Problem: Health Behavior/Discharge Planning: Goal: Ability to manage health-related needs will improve 07/17/2021 1705 by Theotis Barrio, RN Outcome: Adequate for Discharge 07/17/2021 1704 by Theotis Barrio, RN Outcome: Adequate for Discharge   Problem: Education: Goal: Knowledge of General Education information will improve Description: Including pain rating scale, medication(s)/side effects and non-pharmacologic comfort measures 07/17/2021 1705 by Theotis Barrio, RN Outcome: Adequate for Discharge 07/17/2021 1704 by Theotis Barrio, RN Outcome: Adequate for Discharge   Problem: Clinical Measurements: Goal: Ability to maintain clinical measurements within normal limits will improve 07/17/2021 1705 by Theotis Barrio, RN Outcome: Adequate for Discharge 07/17/2021 1704 by Theotis Barrio, RN Outcome: Adequate for Discharge Goal: Will remain free from infection 07/17/2021 1705 by Theotis Barrio, RN Outcome: Adequate for Discharge 07/17/2021 1704 by Theotis Barrio, RN Outcome: Adequate for Discharge Goal: Diagnostic test results will improve 07/17/2021 1705 by Theotis Barrio, RN Outcome: Adequate for Discharge 07/17/2021 1704 by Theotis Barrio, RN Outcome: Adequate for Discharge Goal: Respiratory complications will improve 07/17/2021 1705 by Theotis Barrio, RN Outcome: Adequate for Discharge 07/17/2021 1704 by Theotis Barrio, RN Outcome: Adequate for Discharge Goal: Cardiovascular complication will be avoided 07/17/2021 1705 by Theotis Barrio, RN Outcome: Adequate for Discharge 07/17/2021 1704 by Theotis Barrio, RN Outcome: Adequate for Discharge   Problem: Activity: Goal: Risk for activity intolerance will decrease 07/17/2021 1705 by Theotis Barrio, RN Outcome: Adequate for Discharge 07/17/2021 1704 by Theotis Barrio, RN Outcome: Adequate for Discharge   Problem: Nutrition: Goal: Adequate nutrition will be maintained 07/17/2021 1705 by  Theotis Barrio, RN Outcome: Adequate for Discharge 07/17/2021 1704 by Theotis Barrio, RN Outcome: Adequate for Discharge   Problem: Coping: Goal: Level of anxiety will decrease 07/17/2021 1705 by Theotis Barrio, RN Outcome: Adequate for Discharge 07/17/2021 1704 by Theotis Barrio, RN Outcome: Adequate for Discharge   Problem: Elimination: Goal: Will not experience complications related to bowel motility 07/17/2021 1705 by Theotis Barrio, RN Outcome: Adequate for Discharge 07/17/2021 1704 by Theotis Barrio, RN Outcome: Adequate for Discharge Goal: Will not experience complications related to urinary retention 07/17/2021 1705 by Theotis Barrio, RN Outcome: Adequate for Discharge 07/17/2021 1704 by Theotis Barrio, RN Outcome: Adequate for Discharge   Problem: Pain Managment: Goal: General experience of comfort will improve 07/17/2021 1705 by Theotis Barrio, RN Outcome: Adequate for Discharge 07/17/2021 1704 by Theotis Barrio, RN Outcome: Adequate for Discharge   Problem: Safety: Goal: Ability to remain free from injury will improve 07/17/2021 1705 by Theotis Barrio, RN Outcome: Adequate for Discharge 07/17/2021 1704 by Theotis Barrio, RN Outcome: Adequate for Discharge   Problem: Skin Integrity: Goal: Risk for impaired skin integrity will decrease 07/17/2021 1705 by Theotis Barrio, RN Outcome: Adequate for Discharge 07/17/2021 1704 by Theotis Barrio, RN Outcome: Adequate for Discharge   P
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
## 2021-07-17 NOTE — Progress Notes (Signed)
Occupational Therapy Treatment Patient Details Name: Evan Chambers MRN: [id number] DOB: [birth year] Today's Date: 07/17/2021   History of present illness Pt is a 60 y.o. male admitted 06/23/21 with confusion. 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 S/p R thoracentesis 9/25. Further workup suggested R-side chronic aspiration PNA with emphyema. S/p R-side VATS, mini R thoracotomy, drainage of empyema on 9/27. S/p bronchoscopy 9/29 for mucous plug removal. PMH includes traumatic L AKA ([place]W), HTN, DM2, ETOH use.   OT comments  Patient received in bed and agreeable to get in recliner. Patient was mod assist to get to eob with min assist to min guard for balance.  Nursing assisted with standing in stedy and transfer to [facility].  Patient assisted with leaning private area and legs.  Patient stood in stedy for toilet hygiene with assistance for balance and was transferred to recliner. Patient to continue to be followed by acute OT.    Recommendations for follow up therapy are one component of a multi-disciplinary discharge planning process, led by the attending physician.  Recommendations may be updated based on patient status, additional functional criteria and insurance authorization.    Follow Up Recommendations  SNF    Equipment Recommendations  Wheelchair (measurements OT);Wheelchair cushion (measurements OT);3 in 1 bedside commode;Tub/shower bench    Recommendations for Other Services      Precautions / Restrictions Precautions Precautions: Fall;Other (comment) Precaution Comments: H/o L AKA (prosthetic in room, but no sleeve/sock); bowel urgency/incontinence Restrictions Weight Bearing Restrictions: No       Mobility Bed Mobility Overal bed mobility: Needs Assistance Bed Mobility: Supine to Sit     Supine to sit: Mod assist;HOB elevated     General bed mobility comments: Required cues to use rail    Transfers Overall transfer level: Needs assistance Equipment used: Ambulation  equipment used Transfers: Sit to/from Stand Sit to Stand: Mod assist;+2 physical assistance         General transfer comment: ModA+2 for trunk elevation standing in stedy frame, pt able to assist well with BUE support pulling on stedy frame to stand    Balance Overall balance assessment: Needs assistance Sitting-balance support: Feet supported;Bilateral upper extremity supported Sitting balance-Leahy Scale: Poor Sitting balance - Comments: min to min guard assist for sitting balance with UE support Postural control: Posterior lean;Left lateral lean Standing balance support: Bilateral upper extremity supported Standing balance-Leahy Scale: Poor Standing balance comment: used BUE for support while standing in stedy                           ADL either performed or assessed with clinical judgement   ADL Overall ADL's : Needs assistance/impaired     Grooming: Wash/dry face;Sitting Grooming Details (indicate cue type and reason): min assist to min guard for balance while sitting on eob to wash face     Lower Body Bathing: Moderate assistance;Sitting/lateral leans Lower Body Bathing Details (indicate cue type and reason): bathed LB while sitting on BSC Upper Body Dressing : Minimal assistance;Sitting Upper Body Dressing Details (indicate cue type and reason): donned new gown     Toilet Transfer: Moderate assistance;+2 for physical assistance Toilet Transfer Details (indicate cue type and reason): used steady for transfer Toileting- Clothing Manipulation and Hygiene: Maximal assistance;Sit to/from stand Toileting - Clothing Manipulation Details (indicate cue type and reason): patient stood in steady for toilet hygiene     Functional mobility during ADLs: Moderate assistance;+2 for physical assistance (used stedy) General ADL Comments: steady used  for transfer to [facility] and recliner     Vision       Perception     Praxis      Cognition Arousal/Alertness:  Awake/alert Behavior During Therapy: Flat affect Overall Cognitive Status: No family/caregiver present to determine baseline cognitive functioning Area of Impairment: Memory;Following commands;Safety/judgement;Problem solving;Awareness;Attention;Orientation                 Orientation Level: Disoriented to;Time;Situation Current Attention Level: Sustained Memory: Decreased short-term memory Following Commands: Follows one step commands inconsistently;Follows one step commands with increased time Safety/Judgement: Decreased awareness of safety;Decreased awareness of deficits Awareness: Intellectual Problem Solving: Slow processing;Decreased initiation;Difficulty sequencing;Requires verbal cues;Requires tactile cues General Comments: asked frequently how he got in the hospital        Exercises     Shoulder Instructions       General Comments      Pertinent Vitals/ Pain       Pain Assessment: Faces Faces Pain Scale: Hurts a little bit Pain Location: LUE Pain Descriptors / Indicators: Sore Pain Intervention(s): Monitored during session  Home Living                                          Prior Functioning/Environment              Frequency  Min 2X/week        Progress Toward Goals  OT Goals(current goals can now be found in the care plan section)  Progress towards OT goals: Progressing toward goals  Acute Rehab OT Goals Patient Stated Goal: none stated by pt; family agreeable to SNF OT Goal Formulation: Patient unable to participate in goal setting Time For Goal Achievement: 07/24/21 Potential to Achieve Goals: Fair ADL Goals Pt Will Perform Grooming: with min guard assist;standing Pt Will Perform Lower Body Bathing: with min guard assist;sit to/from stand Pt Will Perform Lower Body Dressing: with min guard assist;sit to/from stand Pt Will Transfer to Toilet: with min guard assist;ambulating;regular height toilet  Plan Discharge plan  remains appropriate    Co-evaluation                 AM-PAC OT "6 Clicks" Daily Activity     Outcome Measure   Help from another person eating meals?: A Little Help from another person taking care of personal grooming?: A Little Help from another person toileting, which includes using toliet, bedpan, or urinal?: A Lot Help from another person bathing (including washing, rinsing, drying)?: A Lot Help from another person to put on and taking off regular upper body clothing?: A Lot Help from another person to put on and taking off regular lower body clothing?: Total 6 Click Score: 13    End of Session Equipment Utilized During Treatment: Other (comment);Gait belt  OT Visit Diagnosis: Unsteadiness on feet (R26.81);Muscle weakness (generalized) (M62.81);Other symptoms and signs involving cognitive function;Pain Pain - Right/Left: Left Pain - part of body: Shoulder   Activity Tolerance Patient tolerated treatment well   Patient Left in chair;with call bell/phone within reach;with chair alarm set   Nurse Communication Need for lift equipment        Time: 4315-4008 OT Time Calculation (min): 32 min  Charges: OT General Charges $OT Visit: 1 Visit OT Treatments $Self Care/Home Management : 23-37 mins  Lodema Hong, [place] 07/17/2021, 12:44 PM

## 2021-07-21 DIAGNOSIS — J869 Pyothorax without fistula: Secondary | ICD-10-CM | POA: Diagnosis not present

## 2021-07-21 DIAGNOSIS — G9341 Metabolic encephalopathy: Secondary | ICD-10-CM | POA: Diagnosis not present

## 2021-07-21 DIAGNOSIS — R109 Unspecified abdominal pain: Secondary | ICD-10-CM | POA: Diagnosis not present

## 2021-07-21 DIAGNOSIS — N179 Acute kidney failure, unspecified: Secondary | ICD-10-CM | POA: Diagnosis not present

## 2021-07-21 DIAGNOSIS — D500 Iron deficiency anemia secondary to blood loss (chronic): Secondary | ICD-10-CM | POA: Diagnosis not present

## 2021-07-21 DIAGNOSIS — E512 Wernicke's encephalopathy: Secondary | ICD-10-CM | POA: Diagnosis not present

## 2021-07-21 DIAGNOSIS — J690 Pneumonitis due to inhalation of food and vomit: Secondary | ICD-10-CM | POA: Diagnosis not present

## 2021-07-21 DIAGNOSIS — E872 Acidosis, unspecified: Secondary | ICD-10-CM | POA: Diagnosis not present

## 2021-07-24 DIAGNOSIS — R69 Illness, unspecified: Secondary | ICD-10-CM | POA: Diagnosis not present

## 2021-07-24 DIAGNOSIS — E875 Hyperkalemia: Secondary | ICD-10-CM | POA: Diagnosis not present

## 2021-07-25 DIAGNOSIS — G9341 Metabolic encephalopathy: Secondary | ICD-10-CM | POA: Diagnosis not present

## 2021-07-25 DIAGNOSIS — R5381 Other malaise: Secondary | ICD-10-CM | POA: Diagnosis not present

## 2021-07-26 DIAGNOSIS — E875 Hyperkalemia: Secondary | ICD-10-CM | POA: Diagnosis not present

## 2021-07-28 DIAGNOSIS — R5381 Other malaise: Secondary | ICD-10-CM | POA: Diagnosis not present

## 2021-07-28 DIAGNOSIS — G9341 Metabolic encephalopathy: Secondary | ICD-10-CM | POA: Diagnosis not present

## 2021-07-28 DIAGNOSIS — E876 Hypokalemia: Secondary | ICD-10-CM | POA: Diagnosis not present

## 2021-07-29 ENCOUNTER — Telehealth: Payer: Self-pay

## 2021-07-29 ENCOUNTER — Other Ambulatory Visit: Payer: Self-pay

## 2021-07-29 ENCOUNTER — Ambulatory Visit (INDEPENDENT_AMBULATORY_CARE_PROVIDER_SITE_OTHER): Payer: Self-pay | Admitting: Thoracic Surgery (Cardiothoracic Vascular Surgery)

## 2021-07-29 ENCOUNTER — Encounter: Payer: Self-pay | Admitting: Thoracic Surgery (Cardiothoracic Vascular Surgery)

## 2021-07-29 ENCOUNTER — Ambulatory Visit
Admission: RE | Admit: 2021-07-29 | Discharge: 2021-07-29 | Disposition: A | Discharge status: Home / Self Care | Payer: Medicare HMO | Source: Ambulatory Visit | Attending: Thoracic Surgery (Cardiothoracic Vascular Surgery) | Admitting: Thoracic Surgery (Cardiothoracic Vascular Surgery)

## 2021-07-29 VITALS — BP 125/85 | HR 113 | Resp 20 | Ht 67.0 in | Wt 144.0 lb

## 2021-07-29 DIAGNOSIS — Z09 Encounter for follow-up examination after completed treatment for conditions other than malignant neoplasm: Secondary | ICD-10-CM

## 2021-07-29 DIAGNOSIS — J9811 Atelectasis: Secondary | ICD-10-CM | POA: Diagnosis not present

## 2021-07-29 DIAGNOSIS — J869 Pyothorax without fistula: Secondary | ICD-10-CM

## 2021-07-29 DIAGNOSIS — E083211 Diabetes mellitus due to underlying condition with mild nonproliferative diabetic retinopathy with macular edema, right eye: Secondary | ICD-10-CM | POA: Diagnosis not present

## 2021-07-29 NOTE — Progress Notes (Signed)
[address]       [place], [address]             [phone]     HPI: Evan Chambers returns for scheduled postoperative  follow-up visit following a decortication for empyema.  Evan Chambers is a 60 year old man with a past medical history significant for hiatal hernia, Schatzki's ring, prostate cancer, hypertension, and ethanol abuse.  He was admitted with altered mental status back in September.  Work-up revealed a right lower lobe pneumonia/lung abscess with an empyema.  He underwent right VATS for decortication on 07/01/2021.  He required bronchoscopy on 07/04/2021 for mucous plugging.  His mental status improved dramatically.  His postoperative course was unremarkable after the bronchoscopy.  He was discharged to a skilled nursing facility on 07/17/2021.  He remains on linezolid.  He denies pain.  He is not having any trouble with his breathing.  He is working with physical therapy at the SNF.  Past Medical History:  Diagnosis Date   Acute renal failure (ARF) ([place]) 03/16/2019   Hiatal hernia    Hypertension    Lower esophageal ring    Prostate CA (HCC)     Current Outpatient Medications  Medication Sig Dispense Refill   linezolid (ZYVOX) 600 MG tablet Take 1 tablet (600 mg total) by mouth every 12 (twelve) hours. 74 tablet 0   metFORMIN (GLUCOPHAGE) 500 MG tablet Take 1 tablet (500 mg total) by mouth 2 (two) times daily with a meal. (Patient taking differently: Take 500 mg by mouth daily with breakfast.) 180 tablet 0   metoprolol tartrate (LOPRESSOR) 25 MG tablet Take 1 tablet (25 mg total) by mouth 2 (two) times daily. 60 tablet 0   Multiple Vitamins-Minerals (ONE-A-DAY MENS 50+) TABS Take 1 tablet by mouth daily.     omeprazole (PRILOSEC OTC) 20 MG tablet Take 20 mg by mouth daily.     No current facility-administered medications for this visit.    Physical Exam BP 125/85 (BP Location: Right Arm, Patient Position: Sitting, Cuff Size: Normal)   Pulse (!)  113   Resp 20   Ht 5\' 7"  (1.702 m)   Wt 144 lb (65.3 kg)   SpO2 99% Comment: RA  BMI 22.76 kg/m  60 year old man in no acute distress Alert and oriented x3 with no focal deficits Cardiac tachycardic, regular, no murmur Lungs slightly diminished to right base but otherwise clear Incisions well-healed  Diagnostic Tests: CHEST - 2 VIEW   COMPARISON:  07/12/2021   FINDINGS: Normal heart size, mediastinal contours, and pulmonary vascularity.   Minimal bibasilar atelectasis greater on RIGHT.   Residual pleural thickening versus effusion at RIGHT inferior hemithorax.   Remaining lungs clear.   No areas of consolidation or pneumothorax.   Osseous structures unremarkable.   IMPRESSION: Residual pleural effusion versus thickening at RIGHT lung base with bibasilar atelectasis.     Electronically Signed   By: Lavonia Dana M.D.   On: 07/29/2021 10:13 I personally reviewed the chest x-ray images.  It shows a good result post decortication.  Impression: Evan Chambers is a 60 year old man with a past medical history significant for hiatal hernia, Schatzki's ring, prostate cancer, hypertension, and ethanol abuse.  He was admitted with altered mental status and found to have pneumonia complicated by lung abscess and empyema.  He underwent right VATS for decortication on 07/01/2021.  He then required bronchoscopy for mucous plugging on 07/04/2021.  He recovered uneventfully for that.  He was  discharged to a SNF on 07/17/2021.  Currently is doing well.  He is not having any significant incisional pain.  He is not having any respiratory issues.  His mental status improved dramatically with treatment of the underlying infection.  There were no restrictions on his activities.  He will complete his course of linezolid.  Plan: I will be happy to see Mr. Stemm back in the future if I can be of any further assistance with his care  [name], MD [facility] [phone]

## 2021-07-29 NOTE — Telephone Encounter (Signed)
Patient seen in the office. 

## 2021-07-31 DIAGNOSIS — S78112A Complete traumatic amputation at level between left hip and knee, initial encounter: Secondary | ICD-10-CM | POA: Diagnosis not present

## 2021-07-31 DIAGNOSIS — G9341 Metabolic encephalopathy: Secondary | ICD-10-CM | POA: Diagnosis not present

## 2021-07-31 DIAGNOSIS — E876 Hypokalemia: Secondary | ICD-10-CM | POA: Diagnosis not present

## 2021-07-31 NOTE — Telephone Encounter (Signed)
Patient was seen in the office with Dr. Roxan Hockey for follow-up and released. Patient was brought into the office by an employee from [facility] as patient had a chest xray before his appointment. Patient was not accompanied by anyone and was in a wheelchair with gown, socks, and a blanket. He did have a cell phone with him. After his appointment patient was taken down to the lobby where he said that he was waiting on his "Kathryne Sharper" who was driving a "blue minivan" would pick him up. He was taken into the parking lot to look for his family as he states that his phone battery died. Unable to locate patient's family. Paperwork that was brought in by the patient did have a driver number and number was dialed. No answer but message left for return call and advised that patient was ready for pick-up. In the meantime, patient's daughter called patient's cell phone, phone was not dead as once thought and patient stated that daughter would be coming to pick patient up. Patient unable to work his phone, advised that if he had any questions to ask at information desk. Patient remained in the lobby for 5 hours. Attempted to give snack and drink for patient. Contacted patient's daughter and advised that her father has not been picked up by facility which he was at. She stated that they were to pick him up. Also contacted facility to advised patient had not been picked up. Patient was picked up by transportation about 30 mins later.

## 2021-08-04 DIAGNOSIS — R7309 Other abnormal glucose: Secondary | ICD-10-CM | POA: Diagnosis not present

## 2021-08-04 DIAGNOSIS — E7849 Other hyperlipidemia: Secondary | ICD-10-CM | POA: Diagnosis not present

## 2021-08-04 DIAGNOSIS — I10 Essential (primary) hypertension: Secondary | ICD-10-CM | POA: Diagnosis not present

## 2021-08-05 DIAGNOSIS — G9341 Metabolic encephalopathy: Secondary | ICD-10-CM | POA: Diagnosis not present

## 2021-08-05 DIAGNOSIS — H1132 Conjunctival hemorrhage, left eye: Secondary | ICD-10-CM | POA: Diagnosis not present

## 2021-08-05 DIAGNOSIS — E860 Dehydration: Secondary | ICD-10-CM | POA: Diagnosis not present

## 2021-08-05 DIAGNOSIS — E512 Wernicke's encephalopathy: Secondary | ICD-10-CM | POA: Diagnosis not present

## 2021-08-05 DIAGNOSIS — R5383 Other fatigue: Secondary | ICD-10-CM | POA: Diagnosis present

## 2021-08-05 DIAGNOSIS — R634 Abnormal weight loss: Secondary | ICD-10-CM | POA: Diagnosis not present

## 2021-08-05 DIAGNOSIS — E875 Hyperkalemia: Secondary | ICD-10-CM | POA: Diagnosis not present

## 2021-08-05 DIAGNOSIS — R1312 Dysphagia, oropharyngeal phase: Secondary | ICD-10-CM | POA: Diagnosis not present

## 2021-08-05 DIAGNOSIS — Z8546 Personal history of malignant neoplasm of prostate: Secondary | ICD-10-CM | POA: Diagnosis not present

## 2021-08-05 DIAGNOSIS — S78112A Complete traumatic amputation at level between left hip and knee, initial encounter: Secondary | ICD-10-CM | POA: Diagnosis not present

## 2021-08-05 DIAGNOSIS — R4182 Altered mental status, unspecified: Secondary | ICD-10-CM | POA: Diagnosis not present

## 2021-08-05 DIAGNOSIS — J869 Pyothorax without fistula: Secondary | ICD-10-CM | POA: Diagnosis not present

## 2021-08-05 DIAGNOSIS — R531 Weakness: Secondary | ICD-10-CM | POA: Diagnosis not present

## 2021-08-05 DIAGNOSIS — M79651 Pain in right thigh: Secondary | ICD-10-CM | POA: Diagnosis not present

## 2021-08-05 DIAGNOSIS — J9811 Atelectasis: Secondary | ICD-10-CM | POA: Diagnosis not present

## 2021-08-05 DIAGNOSIS — E440 Moderate protein-calorie malnutrition: Secondary | ICD-10-CM | POA: Diagnosis not present

## 2021-08-05 DIAGNOSIS — J189 Pneumonia, unspecified organism: Secondary | ICD-10-CM | POA: Diagnosis not present

## 2021-08-05 DIAGNOSIS — Z79899 Other long term (current) drug therapy: Secondary | ICD-10-CM | POA: Diagnosis not present

## 2021-08-05 DIAGNOSIS — E876 Hypokalemia: Secondary | ICD-10-CM | POA: Diagnosis not present

## 2021-08-05 DIAGNOSIS — R7989 Other specified abnormal findings of blood chemistry: Secondary | ICD-10-CM | POA: Diagnosis not present

## 2021-08-05 DIAGNOSIS — I10 Essential (primary) hypertension: Secondary | ICD-10-CM | POA: Diagnosis not present

## 2021-08-05 DIAGNOSIS — Z7984 Long term (current) use of oral hypoglycemic drugs: Secondary | ICD-10-CM | POA: Diagnosis not present

## 2021-08-05 DIAGNOSIS — I152 Hypertension secondary to endocrine disorders: Secondary | ICD-10-CM | POA: Diagnosis not present

## 2021-08-05 DIAGNOSIS — N179 Acute kidney failure, unspecified: Secondary | ICD-10-CM | POA: Diagnosis not present

## 2021-08-05 DIAGNOSIS — E119 Type 2 diabetes mellitus without complications: Secondary | ICD-10-CM | POA: Diagnosis not present

## 2021-08-05 DIAGNOSIS — R55 Syncope and collapse: Secondary | ICD-10-CM | POA: Diagnosis not present

## 2021-08-05 DIAGNOSIS — M79674 Pain in right toe(s): Secondary | ICD-10-CM | POA: Diagnosis not present

## 2021-08-05 DIAGNOSIS — E1159 Type 2 diabetes mellitus with other circulatory complications: Secondary | ICD-10-CM | POA: Diagnosis not present

## 2021-08-05 DIAGNOSIS — Z743 Need for continuous supervision: Secondary | ICD-10-CM | POA: Diagnosis not present

## 2021-08-05 DIAGNOSIS — Z20822 Contact with and (suspected) exposure to covid-19: Secondary | ICD-10-CM | POA: Diagnosis not present

## 2021-08-05 DIAGNOSIS — I959 Hypotension, unspecified: Secondary | ICD-10-CM | POA: Diagnosis not present

## 2021-08-07 DIAGNOSIS — E876 Hypokalemia: Secondary | ICD-10-CM | POA: Diagnosis not present

## 2021-08-07 DIAGNOSIS — M79651 Pain in right thigh: Secondary | ICD-10-CM | POA: Diagnosis not present

## 2021-08-07 DIAGNOSIS — N179 Acute kidney failure, unspecified: Secondary | ICD-10-CM | POA: Diagnosis not present

## 2021-08-07 DIAGNOSIS — R7989 Other specified abnormal findings of blood chemistry: Secondary | ICD-10-CM | POA: Diagnosis not present

## 2021-08-11 DIAGNOSIS — M79674 Pain in right toe(s): Secondary | ICD-10-CM | POA: Diagnosis not present

## 2021-08-11 DIAGNOSIS — E512 Wernicke's encephalopathy: Secondary | ICD-10-CM | POA: Diagnosis not present

## 2021-08-18 DIAGNOSIS — I10 Essential (primary) hypertension: Secondary | ICD-10-CM | POA: Diagnosis not present

## 2021-08-18 DIAGNOSIS — E119 Type 2 diabetes mellitus without complications: Secondary | ICD-10-CM | POA: Diagnosis not present

## 2021-08-18 DIAGNOSIS — S78112A Complete traumatic amputation at level between left hip and knee, initial encounter: Secondary | ICD-10-CM | POA: Diagnosis not present

## 2021-08-18 DIAGNOSIS — G9341 Metabolic encephalopathy: Secondary | ICD-10-CM | POA: Diagnosis not present

## 2021-08-22 DIAGNOSIS — E1159 Type 2 diabetes mellitus with other circulatory complications: Secondary | ICD-10-CM | POA: Diagnosis not present

## 2021-08-22 DIAGNOSIS — E440 Moderate protein-calorie malnutrition: Secondary | ICD-10-CM | POA: Diagnosis not present

## 2021-09-01 DIAGNOSIS — H1132 Conjunctival hemorrhage, left eye: Secondary | ICD-10-CM | POA: Diagnosis not present

## 2021-09-10 DIAGNOSIS — E512 Wernicke's encephalopathy: Secondary | ICD-10-CM | POA: Diagnosis not present

## 2021-09-10 DIAGNOSIS — E119 Type 2 diabetes mellitus without complications: Secondary | ICD-10-CM | POA: Diagnosis not present

## 2021-09-10 DIAGNOSIS — I10 Essential (primary) hypertension: Secondary | ICD-10-CM | POA: Diagnosis not present

## 2021-09-11 ENCOUNTER — Ambulatory Visit: Payer: Medicare HMO | Admitting: Orthopedic Surgery

## 2021-09-18 ENCOUNTER — Emergency Department (HOSPITAL_COMMUNITY)
Admission: EM | Admit: 2021-09-18 | Discharge: 2021-09-18 | Disposition: A | Discharge status: Home / Self Care | Payer: Medicare HMO | Attending: Emergency Medicine | Admitting: Emergency Medicine

## 2021-09-18 ENCOUNTER — Ambulatory Visit: Payer: Medicare HMO | Admitting: Orthopedic Surgery

## 2021-09-18 ENCOUNTER — Emergency Department (HOSPITAL_COMMUNITY): Payer: Medicare HMO

## 2021-09-18 ENCOUNTER — Other Ambulatory Visit: Payer: Self-pay

## 2021-09-18 DIAGNOSIS — Z79899 Other long term (current) drug therapy: Secondary | ICD-10-CM | POA: Diagnosis not present

## 2021-09-18 DIAGNOSIS — R55 Syncope and collapse: Secondary | ICD-10-CM | POA: Diagnosis not present

## 2021-09-18 DIAGNOSIS — J9811 Atelectasis: Secondary | ICD-10-CM | POA: Diagnosis not present

## 2021-09-18 DIAGNOSIS — Z20822 Contact with and (suspected) exposure to covid-19: Secondary | ICD-10-CM | POA: Diagnosis not present

## 2021-09-18 DIAGNOSIS — Z8546 Personal history of malignant neoplasm of prostate: Secondary | ICD-10-CM | POA: Diagnosis not present

## 2021-09-18 DIAGNOSIS — Z743 Need for continuous supervision: Secondary | ICD-10-CM | POA: Diagnosis not present

## 2021-09-18 DIAGNOSIS — Z7984 Long term (current) use of oral hypoglycemic drugs: Secondary | ICD-10-CM | POA: Insufficient documentation

## 2021-09-18 DIAGNOSIS — E119 Type 2 diabetes mellitus without complications: Secondary | ICD-10-CM | POA: Diagnosis not present

## 2021-09-18 DIAGNOSIS — R531 Weakness: Secondary | ICD-10-CM | POA: Insufficient documentation

## 2021-09-18 DIAGNOSIS — J189 Pneumonia, unspecified organism: Secondary | ICD-10-CM | POA: Diagnosis not present

## 2021-09-18 DIAGNOSIS — I10 Essential (primary) hypertension: Secondary | ICD-10-CM | POA: Insufficient documentation

## 2021-09-18 DIAGNOSIS — E860 Dehydration: Secondary | ICD-10-CM | POA: Diagnosis not present

## 2021-09-18 DIAGNOSIS — R5383 Other fatigue: Secondary | ICD-10-CM | POA: Diagnosis not present

## 2021-09-18 DIAGNOSIS — E876 Hypokalemia: Secondary | ICD-10-CM | POA: Diagnosis not present

## 2021-09-18 DIAGNOSIS — R4182 Altered mental status, unspecified: Secondary | ICD-10-CM | POA: Diagnosis not present

## 2021-09-18 DIAGNOSIS — I959 Hypotension, unspecified: Secondary | ICD-10-CM | POA: Diagnosis not present

## 2021-09-18 LAB — URINALYSIS, ROUTINE W REFLEX MICROSCOPIC
Bacteria, UA: NONE SEEN
Bilirubin Urine: NEGATIVE
Glucose, UA: NEGATIVE mg/dL
Hgb urine dipstick: NEGATIVE
Ketones, ur: NEGATIVE mg/dL
Leukocytes,Ua: NEGATIVE
Nitrite: NEGATIVE
Protein, ur: NEGATIVE mg/dL
Specific Gravity, Urine: 1.025 (ref 1.005–1.030)
pH: 6 (ref 5.0–8.0)

## 2021-09-18 LAB — MAGNESIUM: Magnesium: 1.4 mg/dL — ABNORMAL LOW (ref 1.7–2.4)

## 2021-09-18 LAB — BASIC METABOLIC PANEL
Anion gap: 11 (ref 5–15)
BUN: <5 mg/dL — ABNORMAL LOW (ref 6–20)
CO2: 24 mmol/L (ref 22–32)
Calcium: 8.6 mg/dL — ABNORMAL LOW (ref 8.9–10.3)
Chloride: 104 mmol/L (ref 98–111)
Creatinine, Ser: 0.87 mg/dL (ref 0.61–1.24)
GFR, Estimated: >60 mL/min (ref >60)
Glucose, Bld: 141 mg/dL — ABNORMAL HIGH (ref 70–99)
Potassium: 2.9 mmol/L — ABNORMAL LOW (ref 3.5–5.1)
Sodium: 139 mmol/L (ref 135–145)

## 2021-09-18 LAB — TSH: TSH: 2.259 u[IU]/mL (ref 0.350–4.500)

## 2021-09-18 LAB — CBC
HCT: 24.8 % — ABNORMAL LOW (ref 39.0–52.0)
Hemoglobin: 7.9 g/dL — ABNORMAL LOW (ref 13.0–17.0)
MCH: 27.2 pg (ref 26.0–34.0)
MCHC: 31.9 g/dL (ref 30.0–36.0)
MCV: 85.5 fL (ref 80.0–100.0)
Platelets: 263 10*3/uL (ref 150–400)
RBC: 2.9 MIL/uL — ABNORMAL LOW (ref 4.22–5.81)
RDW: 21.1 % — ABNORMAL HIGH (ref 11.5–15.5)
WBC: 7.4 10*3/uL (ref 4.0–10.5)
nRBC: 0 % (ref 0.0–0.2)

## 2021-09-18 LAB — HEPATIC FUNCTION PANEL
ALT: 7 U/L (ref 0–44)
AST: 13 U/L — ABNORMAL LOW (ref 15–41)
Albumin: 2.7 g/dL — ABNORMAL LOW (ref 3.5–5.0)
Alkaline Phosphatase: 53 U/L (ref 38–126)
Bilirubin, Direct: 0.2 mg/dL (ref 0.0–0.2)
Indirect Bilirubin: 1.2 mg/dL — ABNORMAL HIGH (ref 0.3–0.9)
Total Bilirubin: 1.4 mg/dL — ABNORMAL HIGH (ref 0.3–1.2)
Total Protein: 5.9 g/dL — ABNORMAL LOW (ref 6.5–8.1)

## 2021-09-18 LAB — RESP PANEL BY RT-PCR (FLU A&B, COVID) ARPGX2
Influenza A by PCR: NEGATIVE
Influenza B by PCR: NEGATIVE
SARS Coronavirus 2 by RT PCR: NEGATIVE

## 2021-09-18 LAB — CBG MONITORING, ED: Glucose-Capillary: 105 mg/dL — ABNORMAL HIGH (ref 70–99)

## 2021-09-18 LAB — AMMONIA: Ammonia: <10 umol/L (ref 9–35)

## 2021-09-18 MED ORDER — POTASSIUM CHLORIDE CRYS ER 20 MEQ PO TBCR
40.0000 meq | EXTENDED_RELEASE_TABLET | Freq: Once | ORAL | Status: AC
  Administered 2021-09-18: 40 meq via ORAL
  Filled 2021-09-18: qty 2

## 2021-09-18 MED ORDER — MAGNESIUM OXIDE -MG SUPPLEMENT 400 (240 MG) MG PO TABS
400.0000 mg | ORAL_TABLET | Freq: Once | ORAL | Status: AC
  Administered 2021-09-18: 400 mg via ORAL
  Filled 2021-09-18: qty 1

## 2021-09-18 MED ORDER — MAGNESIUM OXIDE 400 MG PO CAPS: 400.0000 mg | ORAL_CAPSULE | Freq: Every day | ORAL | 0 refills | Status: AC

## 2021-09-18 MED ORDER — POTASSIUM CHLORIDE ER 10 MEQ PO TBCR: 20.0000 meq | EXTENDED_RELEASE_TABLET | Freq: Every day | ORAL | 0 refills | Status: DC

## 2021-09-18 NOTE — ED Triage Notes (Signed)
Patient arrives from [facility] due to becoming increasingly fatigued and weak x1 day. Staff at the facility states that the patient was too weak to get up for his doctors appointment this morning and could not ambulate like usual (patient has an old Left BKA). Usual ambulates with prosthetic.

## 2021-09-18 NOTE — Discharge Instructions (Addendum)
Evan Chambers's workup showed that his potassium and magnesium levels were low.  He will be prescribed this medicines to take daily for 30 days.  Both electrolytes should be corrected at the same time. Please recheck his levels in 1 week.  Please have him seen by the facility medical provider this week for reassessment.  He was given some fluids in the ER.  His blood tests and urine tests are included, as well as his CT of the brain.
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
## 2021-09-18 NOTE — ED Notes (Signed)
Pt and daughter verbalized understanding of d/c instructions, meds and followup care. Denies questions. VSS, no distress noted. W/C to exit with all belongings to daughter's vehicle; daughter transporting pt to facility. Report attempted to facility but no answer.

## 2021-09-19 LAB — URINE CULTURE: Culture: NO GROWTH

## 2021-09-22 DIAGNOSIS — R634 Abnormal weight loss: Secondary | ICD-10-CM | POA: Diagnosis not present

## 2021-09-22 DIAGNOSIS — E876 Hypokalemia: Secondary | ICD-10-CM | POA: Diagnosis not present

## 2021-09-29 ENCOUNTER — Emergency Department (HOSPITAL_COMMUNITY): Payer: Medicare HMO

## 2021-09-29 ENCOUNTER — Inpatient Hospital Stay (HOSPITAL_COMMUNITY)
Admission: EM | Admit: 2021-09-29 | Discharge: 2021-10-07 | DRG: 871 | Disposition: A | Discharge status: Skilled Nursing Facility | Payer: Medicare HMO | Attending: Internal Medicine | Admitting: Internal Medicine

## 2021-09-29 ENCOUNTER — Inpatient Hospital Stay (HOSPITAL_COMMUNITY): Payer: Medicare HMO

## 2021-09-29 ENCOUNTER — Encounter (HOSPITAL_COMMUNITY): Payer: Self-pay | Admitting: Internal Medicine

## 2021-09-29 ENCOUNTER — Other Ambulatory Visit: Payer: Self-pay

## 2021-09-29 DIAGNOSIS — R4182 Altered mental status, unspecified: Secondary | ICD-10-CM | POA: Diagnosis not present

## 2021-09-29 DIAGNOSIS — F05 Delirium due to known physiological condition: Secondary | ICD-10-CM | POA: Diagnosis not present

## 2021-09-29 DIAGNOSIS — D509 Iron deficiency anemia, unspecified: Secondary | ICD-10-CM | POA: Diagnosis present

## 2021-09-29 DIAGNOSIS — Z743 Need for continuous supervision: Secondary | ICD-10-CM | POA: Diagnosis not present

## 2021-09-29 DIAGNOSIS — N179 Acute kidney failure, unspecified: Secondary | ICD-10-CM | POA: Diagnosis present

## 2021-09-29 DIAGNOSIS — J181 Lobar pneumonia, unspecified organism: Secondary | ICD-10-CM | POA: Diagnosis not present

## 2021-09-29 DIAGNOSIS — E876 Hypokalemia: Secondary | ICD-10-CM | POA: Diagnosis present

## 2021-09-29 DIAGNOSIS — G6281 Critical illness polyneuropathy: Secondary | ICD-10-CM | POA: Diagnosis not present

## 2021-09-29 DIAGNOSIS — R339 Retention of urine, unspecified: Secondary | ICD-10-CM | POA: Diagnosis not present

## 2021-09-29 DIAGNOSIS — I959 Hypotension, unspecified: Secondary | ICD-10-CM | POA: Diagnosis not present

## 2021-09-29 DIAGNOSIS — D61818 Other pancytopenia: Secondary | ICD-10-CM | POA: Diagnosis not present

## 2021-09-29 DIAGNOSIS — Z20822 Contact with and (suspected) exposure to covid-19: Secondary | ICD-10-CM | POA: Diagnosis not present

## 2021-09-29 DIAGNOSIS — A419 Sepsis, unspecified organism: Principal | ICD-10-CM | POA: Diagnosis present

## 2021-09-29 DIAGNOSIS — Z79899 Other long term (current) drug therapy: Secondary | ICD-10-CM

## 2021-09-29 DIAGNOSIS — E785 Hyperlipidemia, unspecified: Secondary | ICD-10-CM | POA: Diagnosis present

## 2021-09-29 DIAGNOSIS — R9431 Abnormal electrocardiogram [ECG] [EKG]: Secondary | ICD-10-CM | POA: Diagnosis present

## 2021-09-29 DIAGNOSIS — K449 Diaphragmatic hernia without obstruction or gangrene: Secondary | ICD-10-CM | POA: Diagnosis not present

## 2021-09-29 DIAGNOSIS — I10 Essential (primary) hypertension: Secondary | ICD-10-CM | POA: Diagnosis present

## 2021-09-29 DIAGNOSIS — W3400XS Accidental discharge from unspecified firearms or gun, sequela: Secondary | ICD-10-CM | POA: Diagnosis not present

## 2021-09-29 DIAGNOSIS — I700 Atherosclerosis of aorta: Secondary | ICD-10-CM | POA: Diagnosis not present

## 2021-09-29 DIAGNOSIS — R6521 Severe sepsis with septic shock: Secondary | ICD-10-CM | POA: Diagnosis present

## 2021-09-29 DIAGNOSIS — R609 Edema, unspecified: Secondary | ICD-10-CM | POA: Diagnosis not present

## 2021-09-29 DIAGNOSIS — I361 Nonrheumatic tricuspid (valve) insufficiency: Secondary | ICD-10-CM | POA: Diagnosis not present

## 2021-09-29 DIAGNOSIS — R0902 Hypoxemia: Secondary | ICD-10-CM | POA: Diagnosis not present

## 2021-09-29 DIAGNOSIS — J90 Pleural effusion, not elsewhere classified: Secondary | ICD-10-CM | POA: Diagnosis not present

## 2021-09-29 DIAGNOSIS — Z8546 Personal history of malignant neoplasm of prostate: Secondary | ICD-10-CM | POA: Diagnosis not present

## 2021-09-29 DIAGNOSIS — D689 Coagulation defect, unspecified: Secondary | ICD-10-CM | POA: Diagnosis not present

## 2021-09-29 DIAGNOSIS — J189 Pneumonia, unspecified organism: Secondary | ICD-10-CM | POA: Diagnosis present

## 2021-09-29 DIAGNOSIS — A09 Infectious gastroenteritis and colitis, unspecified: Secondary | ICD-10-CM | POA: Diagnosis present

## 2021-09-29 DIAGNOSIS — R17 Unspecified jaundice: Secondary | ICD-10-CM | POA: Diagnosis present

## 2021-09-29 DIAGNOSIS — D638 Anemia in other chronic diseases classified elsewhere: Secondary | ICD-10-CM | POA: Diagnosis present

## 2021-09-29 DIAGNOSIS — E11649 Type 2 diabetes mellitus with hypoglycemia without coma: Secondary | ICD-10-CM | POA: Diagnosis present

## 2021-09-29 DIAGNOSIS — R6520 Severe sepsis without septic shock: Secondary | ICD-10-CM | POA: Diagnosis not present

## 2021-09-29 DIAGNOSIS — A403 Sepsis due to Streptococcus pneumoniae: Secondary | ICD-10-CM

## 2021-09-29 DIAGNOSIS — F1020 Alcohol dependence, uncomplicated: Secondary | ICD-10-CM | POA: Diagnosis present

## 2021-09-29 DIAGNOSIS — I3139 Other pericardial effusion (noninflammatory): Secondary | ICD-10-CM | POA: Diagnosis not present

## 2021-09-29 DIAGNOSIS — Z9079 Acquired absence of other genital organ(s): Secondary | ICD-10-CM

## 2021-09-29 DIAGNOSIS — N281 Cyst of kidney, acquired: Secondary | ICD-10-CM | POA: Diagnosis not present

## 2021-09-29 DIAGNOSIS — Z89612 Acquired absence of left leg above knee: Secondary | ICD-10-CM

## 2021-09-29 DIAGNOSIS — E512 Wernicke's encephalopathy: Secondary | ICD-10-CM | POA: Diagnosis present

## 2021-09-29 DIAGNOSIS — A021 Salmonella sepsis: Secondary | ICD-10-CM | POA: Diagnosis not present

## 2021-09-29 DIAGNOSIS — R531 Weakness: Secondary | ICD-10-CM | POA: Diagnosis not present

## 2021-09-29 DIAGNOSIS — D6489 Other specified anemias: Secondary | ICD-10-CM | POA: Diagnosis present

## 2021-09-29 DIAGNOSIS — Z7984 Long term (current) use of oral hypoglycemic drugs: Secondary | ICD-10-CM

## 2021-09-29 DIAGNOSIS — Z8249 Family history of ischemic heart disease and other diseases of the circulatory system: Secondary | ICD-10-CM

## 2021-09-29 DIAGNOSIS — Z8701 Personal history of pneumonia (recurrent): Secondary | ICD-10-CM

## 2021-09-29 DIAGNOSIS — K529 Noninfective gastroenteritis and colitis, unspecified: Secondary | ICD-10-CM | POA: Diagnosis not present

## 2021-09-29 DIAGNOSIS — D696 Thrombocytopenia, unspecified: Secondary | ICD-10-CM | POA: Diagnosis not present

## 2021-09-29 DIAGNOSIS — E872 Acidosis, unspecified: Secondary | ICD-10-CM

## 2021-09-29 DIAGNOSIS — G9341 Metabolic encephalopathy: Secondary | ICD-10-CM | POA: Diagnosis present

## 2021-09-29 LAB — COMPREHENSIVE METABOLIC PANEL
ALT: 9 U/L (ref 0–44)
AST: 18 U/L (ref 15–41)
Albumin: 3.1 g/dL — ABNORMAL LOW (ref 3.5–5.0)
Alkaline Phosphatase: 56 U/L (ref 38–126)
Anion gap: 14 (ref 5–15)
BUN: <5 mg/dL — ABNORMAL LOW (ref 6–20)
CO2: 16 mmol/L — ABNORMAL LOW (ref 22–32)
Calcium: 8.9 mg/dL (ref 8.9–10.3)
Chloride: 106 mmol/L (ref 98–111)
Creatinine, Ser: 1.01 mg/dL (ref 0.61–1.24)
GFR, Estimated: >60 mL/min (ref >60)
Glucose, Bld: 133 mg/dL — ABNORMAL HIGH (ref 70–99)
Potassium: 4.7 mmol/L (ref 3.5–5.1)
Sodium: 136 mmol/L (ref 135–145)
Total Bilirubin: 1.3 mg/dL — ABNORMAL HIGH (ref 0.3–1.2)
Total Protein: 6.5 g/dL (ref 6.5–8.1)

## 2021-09-29 LAB — CBC WITH DIFFERENTIAL/PLATELET
Abs Immature Granulocytes: 0 10*3/uL (ref 0.00–0.07)
Basophils Absolute: 0 10*3/uL (ref 0.0–0.1)
Basophils Relative: 0 %
Eosinophils Absolute: 0 10*3/uL (ref 0.0–0.5)
Eosinophils Relative: 1 %
HCT: 30.2 % — ABNORMAL LOW (ref 39.0–52.0)
Hemoglobin: 9.4 g/dL — ABNORMAL LOW (ref 13.0–17.0)
Immature Granulocytes: 0 %
Lymphocytes Relative: 41 %
Lymphs Abs: 1.2 10*3/uL (ref 0.7–4.0)
MCH: 27.3 pg (ref 26.0–34.0)
MCHC: 31.1 g/dL (ref 30.0–36.0)
MCV: 87.8 fL (ref 80.0–100.0)
Monocytes Absolute: 0.1 10*3/uL (ref 0.1–1.0)
Monocytes Relative: 5 %
Neutro Abs: 1.5 10*3/uL — ABNORMAL LOW (ref 1.7–7.7)
Neutrophils Relative %: 53 %
Platelets: 118 10*3/uL — ABNORMAL LOW (ref 150–400)
RBC: 3.44 MIL/uL — ABNORMAL LOW (ref 4.22–5.81)
RDW: 20.5 % — ABNORMAL HIGH (ref 11.5–15.5)
WBC: 2.8 10*3/uL — ABNORMAL LOW (ref 4.0–10.5)
nRBC: 0 % (ref 0.0–0.2)

## 2021-09-29 LAB — C-REACTIVE PROTEIN: CRP: 0.5 mg/dL (ref <1.0)

## 2021-09-29 LAB — I-STAT ARTERIAL BLOOD GAS, ED
Acid-base deficit: 12 mmol/L — ABNORMAL HIGH (ref 0.0–2.0)
Bicarbonate: 12.7 mmol/L — ABNORMAL LOW (ref 20.0–28.0)
Calcium, Ion: 1.18 mmol/L (ref 1.15–1.40)
HCT: 21 % — ABNORMAL LOW (ref 39.0–52.0)
Hemoglobin: 7.1 g/dL — ABNORMAL LOW (ref 13.0–17.0)
O2 Saturation: 96 %
Patient temperature: 97.5
Potassium: 4.4 mmol/L (ref 3.5–5.1)
Sodium: 137 mmol/L (ref 135–145)
TCO2: 13 mmol/L — ABNORMAL LOW (ref 22–32)
pCO2 arterial: 22.1 mmHg — ABNORMAL LOW (ref 32.0–48.0)
pH, Arterial: 7.364 (ref 7.350–7.450)
pO2, Arterial: 80 mmHg — ABNORMAL LOW (ref 83.0–108.0)

## 2021-09-29 LAB — CBC
HCT: 23.8 % — ABNORMAL LOW (ref 39.0–52.0)
Hemoglobin: 7.6 g/dL — ABNORMAL LOW (ref 13.0–17.0)
MCH: 27.9 pg (ref 26.0–34.0)
MCHC: 31.9 g/dL (ref 30.0–36.0)
MCV: 87.5 fL (ref 80.0–100.0)
Platelets: 86 10*3/uL — ABNORMAL LOW (ref 150–400)
RBC: 2.72 MIL/uL — ABNORMAL LOW (ref 4.22–5.81)
RDW: 20.6 % — ABNORMAL HIGH (ref 11.5–15.5)
WBC: 1.7 10*3/uL — ABNORMAL LOW (ref 4.0–10.5)
nRBC: 0 % (ref 0.0–0.2)

## 2021-09-29 LAB — TSH: TSH: 1.924 u[IU]/mL (ref 0.350–4.500)

## 2021-09-29 LAB — PROCALCITONIN: Procalcitonin: 2.36 ng/mL

## 2021-09-29 LAB — DIC (DISSEMINATED INTRAVASCULAR COAGULATION)PANEL
D-Dimer, Quant: 0.31 ug/mL-FEU (ref 0.00–0.50)
Fibrinogen: 221 mg/dL (ref 210–475)
INR: 1.3 — ABNORMAL HIGH (ref 0.8–1.2)
Platelets: 89 10*3/uL — ABNORMAL LOW (ref 150–400)
Prothrombin Time: 16.6 seconds — ABNORMAL HIGH (ref 11.4–15.2)
aPTT: 33 seconds (ref 24–36)

## 2021-09-29 LAB — LACTIC ACID, PLASMA: Lactic Acid, Venous: 8.4 mmol/L (ref 0.5–1.9)

## 2021-09-29 LAB — MRSA NEXT GEN BY PCR, NASAL: MRSA by PCR Next Gen: NOT DETECTED

## 2021-09-29 LAB — MAGNESIUM: Magnesium: 1.3 mg/dL — ABNORMAL LOW (ref 1.7–2.4)

## 2021-09-29 LAB — CREATININE, SERUM
Creatinine, Ser: 1.05 mg/dL (ref 0.61–1.24)
GFR, Estimated: >60 mL/min (ref >60)

## 2021-09-29 LAB — TROPONIN I (HIGH SENSITIVITY): Troponin I (High Sensitivity): 4 ng/L (ref <18)

## 2021-09-29 LAB — CORTISOL: Cortisol, Plasma: 24.2 ug/dL

## 2021-09-29 MED ORDER — FOLIC ACID 1 MG PO TABS
1.0000 mg | ORAL_TABLET | Freq: Every day | ORAL | Status: DC
  Administered 2021-09-29 – 2021-10-07 (×9): 1 mg via ORAL
  Filled 2021-09-29 (×9): qty 1

## 2021-09-29 MED ORDER — ONDANSETRON HCL 4 MG/2ML IJ SOLN: 4.0000 mg | Freq: Four times a day (QID) | INTRAMUSCULAR | Status: DC | PRN

## 2021-09-29 MED ORDER — BISACODYL 5 MG PO TBEC: 5.0000 mg | DELAYED_RELEASE_TABLET | Freq: Every day | ORAL | Status: DC | PRN

## 2021-09-29 MED ORDER — ONE-A-DAY MENS 50+ PO TABS: 1.0000 | ORAL_TABLET | Freq: Every day | ORAL | Status: DC

## 2021-09-29 MED ORDER — ADULT MULTIVITAMIN W/MINERALS CH
1.0000 | ORAL_TABLET | Freq: Every day | ORAL | Status: DC
  Administered 2021-09-29 – 2021-09-30 (×2): 1 via ORAL
  Filled 2021-09-29 (×2): qty 1

## 2021-09-29 MED ORDER — HEPARIN SODIUM (PORCINE) 5000 UNIT/ML IJ SOLN
5000.0000 [IU] | Freq: Three times a day (TID) | INTRAMUSCULAR | Status: DC
  Administered 2021-09-29: 20:00:00 5000 [IU] via SUBCUTANEOUS
  Filled 2021-09-29: qty 1

## 2021-09-29 MED ORDER — ASPIRIN 81 MG PO CHEW
81.0000 mg | CHEWABLE_TABLET | Freq: Every day | ORAL | Status: DC
  Administered 2021-09-29: 20:00:00 81 mg via ORAL
  Filled 2021-09-29: qty 1

## 2021-09-29 MED ORDER — ONDANSETRON HCL 4 MG PO TABS: 4.0000 mg | ORAL_TABLET | Freq: Four times a day (QID) | ORAL | Status: DC | PRN

## 2021-09-29 MED ORDER — ACETAMINOPHEN 325 MG PO TABS
650.0000 mg | ORAL_TABLET | Freq: Four times a day (QID) | ORAL | Status: DC | PRN
  Administered 2021-09-30 – 2021-10-05 (×2): 650 mg via ORAL
  Filled 2021-09-29 (×2): qty 2

## 2021-09-29 MED ORDER — MIDODRINE HCL 5 MG PO TABS
10.0000 mg | ORAL_TABLET | Freq: Three times a day (TID) | ORAL | Status: DC
  Administered 2021-09-29 – 2021-09-30 (×3): 10 mg via ORAL
  Filled 2021-09-29 (×3): qty 2

## 2021-09-29 MED ORDER — THIAMINE HCL 100 MG PO TABS
100.0000 mg | ORAL_TABLET | Freq: Every day | ORAL | Status: DC
  Administered 2021-09-29 – 2021-10-07 (×7): 100 mg via ORAL
  Filled 2021-09-29 (×8): qty 1

## 2021-09-29 MED ORDER — VANCOMYCIN HCL 1500 MG/300ML IV SOLN
1500.0000 mg | INTRAVENOUS | Status: DC
  Administered 2021-09-29 – 2021-09-30 (×2): 1500 mg via INTRAVENOUS
  Filled 2021-09-29 (×3): qty 300

## 2021-09-29 MED ORDER — PIPERACILLIN-TAZOBACTAM 3.375 G IVPB
3.3750 g | Freq: Three times a day (TID) | INTRAVENOUS | Status: DC
  Administered 2021-09-30 – 2021-10-05 (×16): 3.375 g via INTRAVENOUS
  Filled 2021-09-29 (×16): qty 50

## 2021-09-29 MED ORDER — OMEPRAZOLE MAGNESIUM 20 MG PO TBEC: 20.0000 mg | DELAYED_RELEASE_TABLET | Freq: Every day | ORAL | Status: DC

## 2021-09-29 MED ORDER — PIPERACILLIN-TAZOBACTAM 3.375 G IVPB 30 MIN
3.3750 g | Freq: Once | INTRAVENOUS | Status: AC
  Administered 2021-09-29: 20:00:00 3.375 g via INTRAVENOUS
  Filled 2021-09-29: qty 50

## 2021-09-29 MED ORDER — LACTATED RINGERS IV BOLUS
1000.0000 mL | Freq: Once | INTRAVENOUS | Status: AC
Start: 2021-09-29 — End: 2021-09-29
  Administered 2021-09-29: 22:00:00 1000 mL via INTRAVENOUS

## 2021-09-29 MED ORDER — SODIUM CHLORIDE 0.9 % IV BOLUS (SEPSIS)
1000.0000 mL | Freq: Once | INTRAVENOUS | Status: AC
  Administered 2021-09-29: 17:00:00 1000 mL via INTRAVENOUS

## 2021-09-29 MED ORDER — LACTATED RINGERS IV SOLN: INTRAVENOUS | Status: DC

## 2021-09-29 MED ORDER — SODIUM CHLORIDE 0.9 % IV BOLUS
1000.0000 mL | Freq: Once | INTRAVENOUS | Status: AC
  Administered 2021-09-29: 15:00:00 1000 mL via INTRAVENOUS

## 2021-09-29 MED ORDER — ACETAMINOPHEN 650 MG RE SUPP: 650.0000 mg | Freq: Four times a day (QID) | RECTAL | Status: DC | PRN

## 2021-09-29 MED ORDER — LACTATED RINGERS IV BOLUS
1000.0000 mL | Freq: Once | INTRAVENOUS | Status: AC
  Administered 2021-09-30: 1000 mL via INTRAVENOUS

## 2021-09-29 MED ORDER — ONDANSETRON HCL 4 MG/2ML IJ SOLN: 4.0000 mg | Freq: Once | INTRAMUSCULAR | Status: DC

## 2021-09-29 MED ORDER — PANTOPRAZOLE SODIUM 40 MG PO TBEC
40.0000 mg | DELAYED_RELEASE_TABLET | Freq: Every day | ORAL | Status: DC
  Administered 2021-09-29 – 2021-10-07 (×9): 40 mg via ORAL
  Filled 2021-09-29 (×9): qty 1

## 2021-09-29 MED ORDER — SODIUM CHLORIDE 0.9 % IV SOLN: 8.0000 mg | Freq: Once | INTRAVENOUS | Status: DC

## 2021-09-29 MED ORDER — SODIUM CHLORIDE 0.9 % IV SOLN
2.0000 g | INTRAVENOUS | Status: DC
  Filled 2021-09-29: qty 20

## 2021-09-29 MED ORDER — LORAZEPAM 1 MG PO TABS
1.0000 mg | ORAL_TABLET | ORAL | Status: AC | PRN
  Administered 2021-09-30: 10:00:00 1 mg via ORAL
  Filled 2021-09-29: qty 1

## 2021-09-29 MED ORDER — ONDANSETRON HCL 4 MG/2ML IJ SOLN
4.0000 mg | Freq: Once | INTRAMUSCULAR | Status: AC
  Administered 2021-09-29: 16:00:00 4 mg via INTRAVENOUS
  Filled 2021-09-29: qty 2

## 2021-09-29 MED ORDER — THIAMINE HCL 100 MG/ML IJ SOLN
100.0000 mg | Freq: Every day | INTRAMUSCULAR | Status: DC
  Administered 2021-09-30 – 2021-10-01 (×2): 100 mg via INTRAVENOUS
  Filled 2021-09-29 (×3): qty 2

## 2021-09-29 MED ORDER — MAGNESIUM SULFATE 2 GM/50ML IV SOLN
2.0000 g | Freq: Once | INTRAVENOUS | Status: AC
  Administered 2021-09-29: 19:00:00 2 g via INTRAVENOUS
  Filled 2021-09-29: qty 50

## 2021-09-29 MED ORDER — ADULT MULTIVITAMIN W/MINERALS CH
1.0000 | ORAL_TABLET | Freq: Every day | ORAL | Status: DC
  Administered 2021-09-29 – 2021-10-07 (×9): 1 via ORAL
  Filled 2021-09-29 (×9): qty 1

## 2021-09-29 MED ORDER — VITAMIN B-12 1000 MCG PO TABS
1000.0000 ug | ORAL_TABLET | Freq: Every day | ORAL | Status: DC
  Administered 2021-09-29 – 2021-10-07 (×9): 1000 ug via ORAL
  Filled 2021-09-29 (×9): qty 1

## 2021-09-29 MED ORDER — LORAZEPAM 2 MG/ML IJ SOLN: 1.0000 mg | INTRAMUSCULAR | Status: AC | PRN

## 2021-09-29 MED ORDER — SODIUM CHLORIDE 0.9 % IV SOLN
500.0000 mg | INTRAVENOUS | Status: DC
  Filled 2021-09-29: qty 5

## 2021-09-29 MED ORDER — VANCOMYCIN HCL 1500 MG/300ML IV SOLN
1500.0000 mg | Freq: Once | INTRAVENOUS | Status: DC
  Filled 2021-09-29: qty 300

## 2021-09-29 NOTE — ED Notes (Signed)
Lactic acid 8.4
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
## 2021-09-29 NOTE — Sepsis Progress Note (Signed)
Code sepsis protocol monitoring by eLink

## 2021-09-29 NOTE — Progress Notes (Addendum)
Pt seen and evaluated at bedside.  60 yo M with septic shock secondary to multifocal PNA.  Ordering 3rd 1L bolus for SBP 90 and lactate 8.4.  Repeat CBC shows worsening leukopenia and thrombocytopenia.  Ordering DIC panel  Ordering ABG.  Pt awake, alert, answering questions, no respiratory distress at this time, and satting 100% on RA despite multifocal PNA with septic shock.  1142 update: BP down to 86U systolic over last hour, HR up to 120s.  Breathing still good.  Will order Liter number 4 bolus.  0125 update: Repeat lactate up to 8.7, remains on IVF, despite lab values, patient clinically continues to look surprisingly well.  Denies pain, denies SOB.  No tachypnea.  Still waiting on the repeat lactate.

## 2021-09-29 NOTE — ED Notes (Signed)
RN notified of bp

## 2021-09-29 NOTE — Sepsis Progress Note (Signed)
This pt has been reported by bedside RN as a very difficult stick for labs. Reported numerous staff and tries to get lactic acid and blood cx drawn.  2L IVF have been given. Antibiotics ordered. Midodrine  started.

## 2021-09-29 NOTE — ED Notes (Addendum)
Pt is a difficult stick. This RN was able to get a 20gIV in the forearm with minimal blood return. The pt.'s health status requires multiple labs and risks loosing the access I have. Our ED phlebotomist have tried 2 attempts to get labs, I have tried 2 attempts, and our EMT has tried 2 attempts. I have contacted IV team so that we may acquire better access. Delay in access will delay patient care and recovery. IV team contacted, MD aware.

## 2021-09-29 NOTE — ED Triage Notes (Signed)
Pt bib EMS for weakness and low BP. BP was 88/56 in trendelenberg position

## 2021-09-29 NOTE — Sepsis Progress Note (Signed)
Bedside RN aware pt has order she may give antibiotics without cultures.

## 2021-09-29 NOTE — ED Notes (Signed)
RN aware of pts BP.  
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
## 2021-09-29 NOTE — ED Notes (Signed)
IV team at the bedside putting in midline

## 2021-09-30 ENCOUNTER — Inpatient Hospital Stay (HOSPITAL_COMMUNITY): Payer: Medicare HMO

## 2021-09-30 DIAGNOSIS — A021 Salmonella sepsis: Secondary | ICD-10-CM | POA: Diagnosis not present

## 2021-09-30 DIAGNOSIS — A419 Sepsis, unspecified organism: Secondary | ICD-10-CM | POA: Diagnosis present

## 2021-09-30 DIAGNOSIS — I361 Nonrheumatic tricuspid (valve) insufficiency: Secondary | ICD-10-CM | POA: Diagnosis not present

## 2021-09-30 DIAGNOSIS — R6520 Severe sepsis without septic shock: Secondary | ICD-10-CM | POA: Diagnosis not present

## 2021-09-30 DIAGNOSIS — G6281 Critical illness polyneuropathy: Secondary | ICD-10-CM | POA: Diagnosis not present

## 2021-09-30 LAB — RETICULOCYTES
Immature Retic Fract: 17.7 % — ABNORMAL HIGH (ref 2.3–15.9)
RBC.: 2.63 MIL/uL — ABNORMAL LOW (ref 4.22–5.81)
Retic Count, Absolute: 12.9 10*3/uL — ABNORMAL LOW (ref 19.0–186.0)
Retic Ct Pct: 0.5 % (ref 0.4–3.1)

## 2021-09-30 LAB — IRON AND TIBC
Iron: 12 ug/dL — ABNORMAL LOW (ref 45–182)
Saturation Ratios: 10 % — ABNORMAL LOW (ref 17.9–39.5)
TIBC: 119 ug/dL — ABNORMAL LOW (ref 250–450)
UIBC: 107 ug/dL

## 2021-09-30 LAB — ECHOCARDIOGRAM COMPLETE
AV Mean grad: 3 mmHg
AV Peak grad: 4.8 mmHg
Ao pk vel: 1.1 m/s
S' Lateral: 2.3 cm

## 2021-09-30 LAB — GLUCOSE, CAPILLARY
Glucose-Capillary: 117 mg/dL — ABNORMAL HIGH (ref 70–99)
Glucose-Capillary: 68 mg/dL — ABNORMAL LOW (ref 70–99)
Glucose-Capillary: 78 mg/dL (ref 70–99)
Glucose-Capillary: 88 mg/dL (ref 70–99)
Glucose-Capillary: 97 mg/dL (ref 70–99)

## 2021-09-30 LAB — CBC WITH DIFFERENTIAL/PLATELET
Abs Immature Granulocytes: 0.2 10*3/uL — ABNORMAL HIGH (ref 0.00–0.07)
Band Neutrophils: 37 %
Basophils Absolute: 0.1 10*3/uL (ref 0.0–0.1)
Basophils Relative: 3 %
Eosinophils Absolute: 0 10*3/uL (ref 0.0–0.5)
Eosinophils Relative: 1 %
HCT: 21 % — ABNORMAL LOW (ref 39.0–52.0)
Hemoglobin: 6.9 g/dL — CL (ref 13.0–17.0)
Lymphocytes Relative: 10 %
Lymphs Abs: 0.4 10*3/uL — ABNORMAL LOW (ref 0.7–4.0)
MCH: 28.4 pg (ref 26.0–34.0)
MCHC: 32.9 g/dL (ref 30.0–36.0)
MCV: 86.4 fL (ref 80.0–100.0)
Metamyelocytes Relative: 4 %
Monocytes Absolute: 0.1 10*3/uL (ref 0.1–1.0)
Monocytes Relative: 2 %
Neutro Abs: 3.3 10*3/uL (ref 1.7–7.7)
Neutrophils Relative %: 43 %
Platelets: 78 10*3/uL — ABNORMAL LOW (ref 150–400)
RBC: 2.43 MIL/uL — ABNORMAL LOW (ref 4.22–5.81)
RDW: 20.6 % — ABNORMAL HIGH (ref 11.5–15.5)
Smear Review: NORMAL
WBC Morphology: INCREASED
WBC: 4.1 10*3/uL (ref 4.0–10.5)
nRBC: 0 % (ref 0.0–0.2)
nRBC: 1 /100 WBC — ABNORMAL HIGH

## 2021-09-30 LAB — RESPIRATORY PANEL BY PCR

## 2021-09-30 LAB — FERRITIN: Ferritin: 156 ng/mL (ref 24–336)

## 2021-09-30 LAB — VITAMIN B12: Vitamin B-12: 349 pg/mL (ref 180–914)

## 2021-09-30 LAB — COMPREHENSIVE METABOLIC PANEL
ALT: 7 U/L (ref 0–44)
AST: 14 U/L — ABNORMAL LOW (ref 15–41)
Albumin: 2 g/dL — ABNORMAL LOW (ref 3.5–5.0)
Alkaline Phosphatase: 35 U/L — ABNORMAL LOW (ref 38–126)
Anion gap: 15 (ref 5–15)
BUN: <5 mg/dL — ABNORMAL LOW (ref 6–20)
CO2: 12 mmol/L — ABNORMAL LOW (ref 22–32)
Calcium: 7.8 mg/dL — ABNORMAL LOW (ref 8.9–10.3)
Chloride: 111 mmol/L (ref 98–111)
Creatinine, Ser: 1.1 mg/dL (ref 0.61–1.24)
GFR, Estimated: >60 mL/min (ref >60)
Glucose, Bld: 64 mg/dL — ABNORMAL LOW (ref 70–99)
Potassium: 4.3 mmol/L (ref 3.5–5.1)
Sodium: 138 mmol/L (ref 135–145)
Total Bilirubin: 1.4 mg/dL — ABNORMAL HIGH (ref 0.3–1.2)
Total Protein: 4.4 g/dL — ABNORMAL LOW (ref 6.5–8.1)

## 2021-09-30 LAB — HEMOGLOBIN AND HEMATOCRIT, BLOOD
HCT: 29 % — ABNORMAL LOW (ref 39.0–52.0)
Hemoglobin: 9.4 g/dL — ABNORMAL LOW (ref 13.0–17.0)

## 2021-09-30 LAB — DIC (DISSEMINATED INTRAVASCULAR COAGULATION)PANEL
D-Dimer, Quant: 0.54 ug/mL-FEU — ABNORMAL HIGH (ref 0.00–0.50)
Fibrinogen: 412 mg/dL (ref 210–475)
INR: 1.5 — ABNORMAL HIGH (ref 0.8–1.2)
Platelets: 89 10*3/uL — ABNORMAL LOW (ref 150–400)
Prothrombin Time: 17.7 seconds — ABNORMAL HIGH (ref 11.4–15.2)
Smear Review: NONE SEEN
aPTT: 40 seconds — ABNORMAL HIGH (ref 24–36)

## 2021-09-30 LAB — RESP PANEL BY RT-PCR (FLU A&B, COVID) ARPGX2
Influenza A by PCR: NEGATIVE
Influenza B by PCR: NEGATIVE
SARS Coronavirus 2 by RT PCR: NEGATIVE

## 2021-09-30 LAB — LACTIC ACID, PLASMA
Lactic Acid, Venous: 8.7 mmol/L (ref 0.5–1.9)
Lactic Acid, Venous: 8.2 mmol/L (ref 0.5–1.9)
Lactic Acid, Venous: >9 mmol/L (ref 0.5–1.9)

## 2021-09-30 LAB — PROCALCITONIN: Procalcitonin: 11.81 ng/mL

## 2021-09-30 LAB — C-REACTIVE PROTEIN: CRP: 5.1 mg/dL — ABNORMAL HIGH (ref <1.0)

## 2021-09-30 LAB — FOLATE: Folate: 36.7 ng/mL (ref >5.9)

## 2021-09-30 LAB — CBG MONITORING, ED: Glucose-Capillary: 85 mg/dL (ref 70–99)

## 2021-09-30 LAB — MAGNESIUM: Magnesium: 1.5 mg/dL — ABNORMAL LOW (ref 1.7–2.4)

## 2021-09-30 LAB — PREPARE RBC (CROSSMATCH)

## 2021-09-30 LAB — BRAIN NATRIURETIC PEPTIDE: B Natriuretic Peptide: 174.9 pg/mL — ABNORMAL HIGH (ref 0.0–100.0)

## 2021-09-30 MED ORDER — SODIUM CHLORIDE 0.9% FLUSH
3.0000 mL | Freq: Two times a day (BID) | INTRAVENOUS | Status: DC
  Administered 2021-09-30 – 2021-10-07 (×8): 3 mL via INTRAVENOUS

## 2021-09-30 MED ORDER — DEXTROSE 50 % IV SOLN
1.0000 | Freq: Once | INTRAVENOUS | Status: AC
  Administered 2021-09-30: 14:00:00 50 mL via INTRAVENOUS
  Filled 2021-09-30: qty 50

## 2021-09-30 MED ORDER — SODIUM CHLORIDE 0.9% IV SOLUTION: Freq: Once | INTRAVENOUS | Status: DC

## 2021-09-30 MED ORDER — IOHEXOL 300 MG/ML  SOLN
100.0000 mL | Freq: Once | INTRAMUSCULAR | Status: AC | PRN
  Administered 2021-09-30: 12:00:00 100 mL via INTRAVENOUS

## 2021-09-30 MED ORDER — DEXAMETHASONE SODIUM PHOSPHATE 10 MG/ML IJ SOLN
10.0000 mg | INTRAMUSCULAR | Status: DC
  Administered 2021-09-30: 14:00:00 10 mg via INTRAVENOUS
  Filled 2021-09-30: qty 1

## 2021-09-30 MED ORDER — SODIUM CHLORIDE 0.9 % IV BOLUS
1000.0000 mL | Freq: Once | INTRAVENOUS | Status: AC
  Administered 2021-09-30: 1000 mL via INTRAVENOUS

## 2021-09-30 MED ORDER — SODIUM CHLORIDE 0.9 % IV SOLN
250.0000 mL | INTRAVENOUS | Status: DC | PRN
  Administered 2021-09-30: 16:00:00 10 mL via INTRAVENOUS

## 2021-09-30 MED ORDER — MAGNESIUM SULFATE 4 GM/100ML IV SOLN
4.0000 g | Freq: Once | INTRAVENOUS | Status: AC
  Administered 2021-09-30: 10:00:00 4 g via INTRAVENOUS
  Filled 2021-09-30: qty 100

## 2021-09-30 MED ORDER — MAGNESIUM SULFATE IN D5W 1-5 GM/100ML-% IV SOLN
1.0000 g | Freq: Once | INTRAVENOUS | Status: AC
  Administered 2021-09-30: 16:00:00 1 g via INTRAVENOUS
  Filled 2021-09-30: qty 100

## 2021-09-30 MED ORDER — HEPARIN SODIUM (PORCINE) 5000 UNIT/ML IJ SOLN
5000.0000 [IU] | Freq: Three times a day (TID) | INTRAMUSCULAR | Status: DC
  Administered 2021-09-30 – 2021-10-07 (×17): 5000 [IU] via SUBCUTANEOUS
  Filled 2021-09-30 (×16): qty 1

## 2021-09-30 MED ORDER — CHLORHEXIDINE GLUCONATE CLOTH 2 % EX PADS
6.0000 | MEDICATED_PAD | Freq: Every day | CUTANEOUS | Status: DC
  Administered 2021-09-30 – 2021-10-07 (×9): 6 via TOPICAL

## 2021-09-30 MED ORDER — INSULIN ASPART 100 UNIT/ML IJ SOLN: 0.0000 [IU] | Freq: Three times a day (TID) | INTRAMUSCULAR | Status: DC

## 2021-09-30 MED ORDER — LACTATED RINGERS IV SOLN: INTRAVENOUS | Status: DC

## 2021-09-30 MED ORDER — SODIUM CHLORIDE 0.9% FLUSH
3.0000 mL | INTRAVENOUS | Status: DC | PRN
  Administered 2021-10-02 – 2021-10-05 (×2): 3 mL via INTRAVENOUS
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
## 2021-09-30 NOTE — Progress Notes (Signed)
[facility] Progress Note Patient Name: Evan Chambers DOB: 01/22/1961 MRN: [id number]   Date of Service  09/30/2021  HPI/Events of Note  Last lactic acid = 8.2  eICU Interventions  Plan: Repeat Lactic Acid at 10 PM post 1 unit PRBC blood transfuison.     Intervention Category Major Interventions: Acid-Base disturbance - evaluation and management  Seerat Peaden Cornelia Copa 09/30/2021, 8:18 PM

## 2021-09-30 NOTE — Evaluation (Signed)
Occupational Therapy Evaluation Patient Details Name: Evan Chambers MRN: [id number] DOB: [birth year] Today's Date: 09/30/2021   History of Present Illness Pt is a 60 y/o male admitted secondary to weakness and hypotension. Found to be in septic shock secondary to PNA. PMH includes L AKA, HTN, DM, ETOH use with recent admission for Wernickes encephalopathy.   Clinical Impression   Pt admitted with above. He demonstrates the below listed deficits and will benefit from continued OT to maximize safety and independence with BADLs.  Pt presents to OT with generalized weakness, decreased activity tolerance, impaired cognition, impaired balance.  Activity limited this date by hypotension - see details below in general comments.  He currently requires min - mod A for UB ADLs and max - total A for LB ADLs.  Pt is a poor historian, but it appears that he was at [facility] for Rehab and ambulatory with his prosthesis.   OT will follow while hospitalized.  Recommend SNF.        Recommendations for follow up therapy are one component of a multi-disciplinary discharge planning process, led by the attending physician.  Recommendations may be updated based on patient status, additional functional criteria and insurance authorization.   Follow Up Recommendations  Skilled nursing-short term rehab (<3 hours/day)    Assistance Recommended at Discharge Frequent or constant Supervision/Assistance  Functional Status Assessment  Patient has had a recent decline in their functional status and demonstrates the ability to make significant improvements in function in a reasonable and predictable amount of time.  Equipment Recommendations  None recommended by OT    Recommendations for Other Services       Precautions / Restrictions Precautions Precautions: Fall Precaution Comments: L AKA, uses prosthetic normally Restrictions Weight Bearing Restrictions: No      Mobility Bed Mobility Overal bed mobility: Needs  Assistance Bed Mobility: Supine to Sit;Sit to Supine     Supine to sit: Min assist;+2 for safety/equipment Sit to supine: Min assist   General bed mobility comments: Required assist for trunk to come to sitting. Min A for RLE assist to return to supine.    Transfers                   General transfer comment: unable to safely attempt      Balance Overall balance assessment: Needs assistance Sitting-balance support: No upper extremity supported Sitting balance-Leahy Scale: Fair                                     ADL either performed or assessed with clinical judgement   ADL Overall ADL's : Needs assistance/impaired Eating/Feeding: Set up;Bed level   Grooming: Wash/dry hands;Wash/dry face;Set up;Supervision/safety;Sitting   Upper Body Bathing: Minimal assistance;Bed level   Lower Body Bathing: Maximal assistance;Bed level   Upper Body Dressing : Moderate assistance;Sitting;Bed level   Lower Body Dressing: Total assistance;Bed level   Toilet Transfer: Total assistance Toilet Transfer Details (indicate cue type and reason): unable to attempt due to hypotension Toileting- Clothing Manipulation and Hygiene: Total assistance;Bed level       Functional mobility during ADLs: Minimal assistance;+2 for safety/equipment;+2 for physical assistance (bed mobility only)       Vision   Additional Comments: not tested     Perception     Praxis      Pertinent Vitals/Pain Pain Assessment: No/denies pain     Hand Dominance Right   Extremity/Trunk Assessment  Upper Extremity Assessment Upper Extremity Assessment: Generalized weakness   Lower Extremity Assessment Lower Extremity Assessment: LLE deficits/detail;RLE deficits/detail RLE Deficits / Details: Limited ROM at ankle LLE Deficits / Details: L AKA at baseline   Cervical / Trunk Assessment Cervical / Trunk Assessment: Normal   Communication Communication Communication: No difficulties    Cognition Arousal/Alertness: Awake/alert Behavior During Therapy: Flat affect Overall Cognitive Status: No family/caregiver present to determine baseline cognitive functioning                                 General Comments: Pt reports it was January and did not know the year. Conflicting PLOF information and home information as well.  Follows one step commands consistently     General Comments  BP in supine at 91/54. Elevated HOB slightly and BP at 89/61 and then at 88/64 after a couple minutes. Elevated HOB to upright position and BP at 97/60 and then at 90/62 after 1-2 mins. Had pt sit at EOB and BP decreasing to 85/62 with HR at 126.    Exercises     Shoulder Instructions      Home Living Family/patient expects to be discharged to:: Private residence Living Arrangements: Alone Available Help at Discharge: Family;Available PRN/intermittently Type of Home: Apartment Home Access: Stairs to enter Entrance Stairs-Number of Steps: 2 Entrance Stairs-Rails: None Home Layout: One level     Bathroom Shower/Tub: Teacher, early years/pre: Standard     Home Equipment: Tub bench;Crutches          Prior Functioning/Environment Prior Level of Function : Patient poor historian/Family not available             Mobility Comments: Uses prosthetic for ambulation. Normally independent. Unsure of current baseline          OT Problem List: Decreased strength;Decreased activity tolerance;Impaired balance (sitting and/or standing);Decreased cognition;Decreased safety awareness;Decreased knowledge of use of DME or AE      OT Treatment/Interventions: Self-care/ADL training;Therapeutic exercise;DME and/or AE instruction;Therapeutic activities;Cognitive remediation/compensation;Patient/family education;Balance training    OT Goals(Current goals can be found in the care plan section) Acute Rehab OT Goals Patient Stated Goal: did not state OT Goal Formulation:  With patient Time For Goal Achievement: 10/14/21 Potential to Achieve Goals: Good ADL Goals Pt Will Perform Upper Body Bathing: with set-up;sitting Pt Will Perform Lower Body Bathing: sit to/from stand;with min assist Pt Will Perform Upper Body Dressing: with set-up;sitting Pt Will Perform Lower Body Dressing: with min assist;sit to/from stand Pt Will Transfer to Toilet: with min assist;stand pivot transfer;bedside commode Pt Will Perform Toileting - Clothing Manipulation and hygiene: with min assist;sit to/from stand  OT Frequency: Min 2X/week   Barriers to D/C: Decreased caregiver support  it appears that pt has been at SNF for rehab       Co-evaluation PT/OT/SLP Co-Evaluation/Treatment: Yes Reason for Co-Treatment: Necessary to address cognition/behavior during functional activity;For patient/therapist safety;To address functional/ADL transfers PT goals addressed during session: Mobility/safety with mobility OT goals addressed during session: Strengthening/ROM;ADL's and self-care      AM-PAC OT "6 Clicks" Daily Activity     Outcome Measure Help from another person eating meals?: A Little Help from another person taking care of personal grooming?: A Little Help from another person toileting, which includes using toliet, bedpan, or urinal?: Total Help from another person bathing (including washing, rinsing, drying)?: A Lot Help from another person to put on and taking off regular  upper body clothing?: A Lot Help from another person to put on and taking off regular lower body clothing?: Total 6 Click Score: 12   End of Session Nurse Communication: Mobility status;Other (comment) (BP)  Activity Tolerance: Treatment limited secondary to medical complications (Comment) (hypotension) Patient left: in bed;with bed alarm set  OT Visit Diagnosis: Unsteadiness on feet (R26.81);Cognitive communication deficit (R41.841)                Time: 4035-2481 OT Time Calculation (min): 20  min Charges:  OT General Charges $OT Visit: 1 Visit OT Evaluation $OT Eval Moderate Complexity: 1 Mod  Nilsa Nutting., OTR/L Acute Rehabilitation Services Pager [phone] Office [place], [place] 09/30/2021, 11:29 AM

## 2021-09-30 NOTE — Plan of Care (Signed)

## 2021-09-30 NOTE — Progress Notes (Signed)
[facility] Progress Note Patient Name: Evan Chambers DOB: [birth year] MRN: [id number]   Date of Service  09/30/2021  HPI/Events of Note  Lactic Acid > 9.0. Etiology? Sepsis +/- liver disease?  eICU Interventions  Plan: Bolus with 0.9 NaCl 1 liter IV over 1 hour now. Repeat Lactic Acid level at 5 AM.     Intervention Category Major Interventions: Acid-Base disturbance - evaluation and management  Lysle Dingwall 09/30/2021, 11:33 PM
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
## 2021-09-30 NOTE — ED Notes (Signed)
Patient transported to CT 
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
## 2021-09-30 NOTE — Progress Notes (Signed)
Patient oriented to room and call bell within reach. Bed alarm on. Spoke to daughter and updated her. She stated patient has some confusion at baseline. And she is his POA and is primary contact for patient.

## 2021-09-30 NOTE — Evaluation (Signed)
Physical Therapy Evaluation Patient Details Name: Evan Chambers MRN: [id number] DOB: [birth year] Today's Date: 09/30/2021  History of Present Illness  Pt is a 60 y/o male admitted secondary to weakness and hypotension. Found to be in septic shock secondary to PNA. PMH includes L AKA, HTN, DM, ETOH use with recent admission for Wernickes encephalopathy.  Clinical Impression  Pt admitted secondary to problem above with deficits below. Cognitive deficits noted, but unsure of baseline. Requiring min A for bed mobility, however, further mobility limited secondary to hypotension. BP in supine at 91/54. Elevated HOB slightly and BP at 89/61 and then at 88/64 after a couple minutes. Elevated HOB to upright position and BP at 97/60 and then at 90/62 after 1-2 mins. Had pt sit at EOB and BP decreasing to 85/62 with HR at 126. Recommend SNF level therapies at d/c. Will continue to follow acutely.        Recommendations for follow up therapy are one component of a multi-disciplinary discharge planning process, led by the attending physician.  Recommendations may be updated based on patient status, additional functional criteria and insurance authorization.  Follow Up Recommendations Skilled nursing-short term rehab (<3 hours/day)    Assistance Recommended at Discharge Frequent or constant Supervision/Assistance  Functional Status Assessment Patient has had a recent decline in their functional status and demonstrates the ability to make significant improvements in function in a reasonable and predictable amount of time.  Equipment Recommendations  None recommended by PT    Recommendations for Other Services       Precautions / Restrictions Precautions Precautions: Fall Precaution Comments: L AKA, uses prosthetic normally Restrictions Weight Bearing Restrictions: No      Mobility  Bed Mobility Overal bed mobility: Needs Assistance Bed Mobility: Supine to Sit;Sit to Supine     Supine to sit:  Min assist;+2 for safety/equipment Sit to supine: Min assist   General bed mobility comments: Required assist for trunk to come to sitting. Min A for RLE assist to return to supine.    Transfers                        Ambulation/Gait                  Stairs            Wheelchair Mobility    Modified Rankin (Stroke Patients Only)       Balance Overall balance assessment: Needs assistance Sitting-balance support: No upper extremity supported Sitting balance-Leahy Scale: Fair                                       Pertinent Vitals/Pain Pain Assessment: No/denies pain    Home Living Family/patient expects to be discharged to:: Private residence Living Arrangements: Alone Available Help at Discharge: Family;Available PRN/intermittently Type of Home: Apartment Home Access: Stairs to enter Entrance Stairs-Rails: None Entrance Stairs-Number of Steps: 2   Home Layout: One level Home Equipment: Tub bench;Crutches      Prior Function Prior Level of Function : Patient poor historian/Family not available             Mobility Comments: Uses prosthetic for ambulation. Normally independent. Unsure of current baseline       Hand Dominance        Extremity/Trunk Assessment   Upper Extremity Assessment Upper Extremity Assessment: Defer to OT evaluation  Lower Extremity Assessment Lower Extremity Assessment: LLE deficits/detail;RLE deficits/detail RLE Deficits / Details: Limited ROM at ankle LLE Deficits / Details: L AKA at baseline    Cervical / Trunk Assessment Cervical / Trunk Assessment: Normal  Communication   Communication: No difficulties  Cognition Arousal/Alertness: Awake/alert Behavior During Therapy: Flat affect Overall Cognitive Status: No family/caregiver present to determine baseline cognitive functioning                                 General Comments: Pt reports it was January and did not  know the year. Conflicting PLOF information and home information as well.        General Comments General comments (skin integrity, edema, etc.): BP in supine at 91/54. Elevated HOB slightly and BP at 89/61 and then at 88/64 after a couple minutes. Elevated HOB to upright position and BP at 97/60 and then at 90/62 after 1-2 mins. Had pt sit at EOB and BP decreasing to 85/62 with HR at 126.    Exercises     Assessment/Plan    PT Assessment Patient needs continued PT services  PT Problem List Decreased strength;Decreased activity tolerance;Decreased balance;Decreased mobility;Decreased knowledge of use of DME;Decreased cognition;Decreased safety awareness;Decreased knowledge of precautions       PT Treatment Interventions DME instruction;Gait training;Therapeutic activities;Functional mobility training;Balance training;Therapeutic exercise;Patient/family education;Wheelchair mobility training    PT Goals (Current goals can be found in the Care Plan section)  Acute Rehab PT Goals Patient Stated Goal: to go home PT Goal Formulation: With patient Time For Goal Achievement: 10/14/21 Potential to Achieve Goals: Good    Frequency Min 2X/week   Barriers to discharge        Co-evaluation PT/OT/SLP Co-Evaluation/Treatment: Yes Reason for Co-Treatment: Complexity of the patient's impairments (multi-system involvement);For patient/therapist safety;To address functional/ADL transfers PT goals addressed during session: Mobility/safety with mobility         AM-PAC PT "6 Clicks" Mobility  Outcome Measure Help needed turning from your back to your side while in a flat bed without using bedrails?: A Little Help needed moving from lying on your back to sitting on the side of a flat bed without using bedrails?: A Little Help needed moving to and from a bed to a chair (including a wheelchair)?: Total Help needed standing up from a chair using your arms (e.g., wheelchair or bedside chair)?:  Total Help needed to walk in hospital room?: Total Help needed climbing 3-5 steps with a railing? : Total 6 Click Score: 10    End of Session   Activity Tolerance: Treatment limited secondary to medical complications (Comment) (hypotension) Patient left: in bed;with call bell/phone within reach (on stretcher in ED in trendelenberg) Nurse Communication: Mobility status PT Visit Diagnosis: Muscle weakness (generalized) (M62.81);Other abnormalities of gait and mobility (R26.89);Difficulty in walking, not elsewhere classified (R26.2)    Time: 9381-0175 PT Time Calculation (min) (ACUTE ONLY): 22 min   Charges:   PT Evaluation $PT Eval Moderate Complexity: 1 Mod          Reuel Derby, PT, DPT  Acute Rehabilitation Services  Pager: [phone] Office: [phone]   Rudean Hitt 09/30/2021, 10:25 AM

## 2021-09-30 NOTE — ED Notes (Signed)
Lunch ordered 

## 2021-09-30 NOTE — ED Notes (Signed)
Unable to get labs ?

## 2021-10-01 ENCOUNTER — Inpatient Hospital Stay (HOSPITAL_COMMUNITY): Payer: Medicare HMO

## 2021-10-01 DIAGNOSIS — J181 Lobar pneumonia, unspecified organism: Secondary | ICD-10-CM

## 2021-10-01 DIAGNOSIS — K529 Noninfective gastroenteritis and colitis, unspecified: Secondary | ICD-10-CM

## 2021-10-01 LAB — COMPREHENSIVE METABOLIC PANEL WITH GFR
ALT: 10 U/L (ref 0–44)
AST: 14 U/L — ABNORMAL LOW (ref 15–41)
Albumin: 1.9 g/dL — ABNORMAL LOW (ref 3.5–5.0)
Alkaline Phosphatase: 40 U/L (ref 38–126)
Anion gap: 11 (ref 5–15)
BUN: 11 mg/dL (ref 6–20)
CO2: 14 mmol/L — ABNORMAL LOW (ref 22–32)
Calcium: 7.8 mg/dL — ABNORMAL LOW (ref 8.9–10.3)
Chloride: 110 mmol/L (ref 98–111)
Creatinine, Ser: 0.97 mg/dL (ref 0.61–1.24)
GFR, Estimated: >60 mL/min
Glucose, Bld: 119 mg/dL — ABNORMAL HIGH (ref 70–99)
Potassium: 4.4 mmol/L (ref 3.5–5.1)
Sodium: 135 mmol/L (ref 135–145)
Total Bilirubin: 1.3 mg/dL — ABNORMAL HIGH (ref 0.3–1.2)
Total Protein: 4.5 g/dL — ABNORMAL LOW (ref 6.5–8.1)

## 2021-10-01 LAB — CBC WITH DIFFERENTIAL/PLATELET
Abs Immature Granulocytes: 0.22 10*3/uL — ABNORMAL HIGH (ref 0.00–0.07)
Basophils Absolute: 0 10*3/uL (ref 0.0–0.1)
Basophils Relative: 0 %
Eosinophils Absolute: 0 10*3/uL (ref 0.0–0.5)
Eosinophils Relative: 0 %
HCT: 25.7 % — ABNORMAL LOW (ref 39.0–52.0)
Hemoglobin: 8.7 g/dL — ABNORMAL LOW (ref 13.0–17.0)
Immature Granulocytes: 2 %
Lymphocytes Relative: 7 %
Lymphs Abs: 1 10*3/uL (ref 0.7–4.0)
MCH: 28.2 pg (ref 26.0–34.0)
MCHC: 33.9 g/dL (ref 30.0–36.0)
MCV: 83.2 fL (ref 80.0–100.0)
Monocytes Absolute: 0.8 10*3/uL (ref 0.1–1.0)
Monocytes Relative: 6 %
Neutro Abs: 12.2 10*3/uL — ABNORMAL HIGH (ref 1.7–7.7)
Neutrophils Relative %: 85 %
Platelets: 92 10*3/uL — ABNORMAL LOW (ref 150–400)
RBC: 3.09 MIL/uL — ABNORMAL LOW (ref 4.22–5.81)
RDW: 19.3 % — ABNORMAL HIGH (ref 11.5–15.5)
Smear Review: DECREASED
WBC: 14.5 10*3/uL — ABNORMAL HIGH (ref 4.0–10.5)
nRBC: 0 % (ref 0.0–0.2)

## 2021-10-01 LAB — BRAIN NATRIURETIC PEPTIDE: B Natriuretic Peptide: 237.2 pg/mL — ABNORMAL HIGH (ref 0.0–100.0)

## 2021-10-01 LAB — MAGNESIUM: Magnesium: 2.1 mg/dL (ref 1.7–2.4)

## 2021-10-01 LAB — GLUCOSE, CAPILLARY
Glucose-Capillary: 92 mg/dL (ref 70–99)
Glucose-Capillary: 95 mg/dL (ref 70–99)
Glucose-Capillary: 100 mg/dL — ABNORMAL HIGH (ref 70–99)
Glucose-Capillary: 90 mg/dL (ref 70–99)
Glucose-Capillary: 161 mg/dL — ABNORMAL HIGH (ref 70–99)
Glucose-Capillary: 271 mg/dL — ABNORMAL HIGH (ref 70–99)

## 2021-10-01 LAB — C-REACTIVE PROTEIN: CRP: 18.4 mg/dL — ABNORMAL HIGH

## 2021-10-01 LAB — PROCALCITONIN: Procalcitonin: 12.07 ng/mL

## 2021-10-01 LAB — LACTIC ACID, PLASMA
Lactic Acid, Venous: 6.6 mmol/L (ref 0.5–1.9)
Lactic Acid, Venous: 3.5 mmol/L (ref 0.5–1.9)

## 2021-10-01 LAB — PATHOLOGIST SMEAR REVIEW

## 2021-10-01 MED ORDER — SODIUM CHLORIDE 0.9% FLUSH: 10.0000 mL | INTRAVENOUS | Status: DC | PRN

## 2021-10-01 MED ORDER — SODIUM CHLORIDE 0.9% FLUSH
10.0000 mL | Freq: Two times a day (BID) | INTRAVENOUS | Status: DC
  Administered 2021-10-01 – 2021-10-02 (×3): 10 mL
  Administered 2021-10-03: 10:00:00 30 mL
  Administered 2021-10-04 – 2021-10-07 (×5): 10 mL

## 2021-10-01 MED ORDER — PLASMA-LYTE 148 IV SOLN: INTRAVENOUS | Status: DC

## 2021-10-01 MED ORDER — PLASMA-LYTE A IV SOLN
INTRAVENOUS | Status: DC
Start: 2021-10-01 — End: 2021-10-03
  Filled 2021-10-01 (×2): qty 1000

## 2021-10-01 NOTE — Progress Notes (Signed)
NAME:  Evan Chambers, MRN:  [id number], DOB:  12-05-1960, LOS: 2 ADMISSION DATE:  09/29/2021, CONSULTATION DATE:  09/30/21 REFERRING MD:  Candiss Norse - TRH, CHIEF COMPLAINT:  PNA    History of Present Illness:  60 yo M PMH DM2, s/p L AKA, prior aspiration PNA, EtOH use disorder, Wernickes encephalopathy, empyema s/p chest tube and VATS 06/2021, who presented to ED from [facility] 12/26 with generalized weakness. Pt reports that at his nursing facility he had a low BP reading but does not know what this was, and endorses associated transient lightheadedness which has now improved. In ED hypotensive 79/52, leukopenic.  Admitted to [facility] for management of severe sepsis due to PNA. Tx with vanc, pip tazo, steroids, midodrine and IVF.   Despite IVF (4L bolus, midodrine, steroids, pt remains hypotensive 12/27. PCCM consulted in this setting   Notably on admission Pancytopenic -- WBC 2.8 RBC 3.44 Hgb 9.4 Plt 118  LA 8.4  Pertinent  Medical History  Empyema; previous decortication on R L AKA DM2 HTN HLD Prostate cancer s/p prostatectomy Aspiration   Significant Hospital Events: Including procedures, antibiotic start and stop dates in addition to other pertinent events   12/26 admitted to [facility] for sepsis due to PNA. Bilateral opacities on CT chest. Small r pleuiral effusion. Hypotensive 12/27 remains borderline hypotensive with SBP 90s MAP 66. PCCM consult. Transferring to ICU   Interim History / Subjective:  Remains off pressors.   Objective   Blood pressure 107/63, pulse (!) 105, temperature 98.2 F (36.8 C), temperature source Oral, resp. rate (!) 21, weight 54.5 kg, SpO2 100 %.        Intake/Output Summary (Last 24 hours) at 10/01/2021 1025 Last data filed at 10/01/2021 0600 Gross per 24 hour  Intake 5091.53 ml  Output 890 ml  Net 4201.53 ml    Filed Weights   09/30/21 1500  Weight: 54.5 kg    Examination: General: middle aged man lying in bed in NAD HENT: [place]/AT, eyes  anicteric Lungs: breathing comfortably on RA, bibasilar rhales and reduced basal breath sounds, but no tachypnea or accessory muscle use.  Cardiovascular: tachycardic, reg rhythm Abdomen: soft, NT, ND Extremities: L AKA- well healed stump. No RLE edema. Neuro: awake and alert, watching TV, answering questions appropriately, moving all extremities Derm: warm, no rashes  LA 6.6 CT personally reviewed> colitis, bilateral small dependent pleural effusions and basilar infiltrates.  RVP negative Blood cultures> NGTD  Resolved Hospital Problem list     Assessment & Plan:   Acute encephalopathy, metabolic> resolved. -sepsis, hypoglycemia  -unclear baseline  Hx of Wernicke's encephalopathy Ongoing EtOH abuse > reports this is several days per week, several drinks, but not daily use -CIWA -vitamins -supporive care  Septic shock due to PNA vs colitis, severe lactic acidosis Multifactorial PNA R>L (POA) Hx aspiration  No evidence of heart failure- ECHO LVEF 60-65%, moderately reduced RV systolic function, moderately enlarged RV. Trop WNL. BNP slightly elevated at 175  -follow blood cultures until finalized -no sputum, not able to collect culture -con't to hold decadron, midodrine -MAP remaining high enough to not need vasopressors  NAGMA due to septic shock, colitis. Possibly significant GI losses, although he does not report significant diarrhea. -con't to monitor metabolic panels  -switch off LR to plasmalyte due to concern for inability to break down lactic acid  DM2 with acute hypoglycemia > resolved -con't to monitor BG levels  Hypomagnesemia , resolved -con't to monitor  Prolonged QTc -avoid Qtc prolonging meds  Thrombocytopenia and acute on chronic anemia. Suspect iron-deficiency anemia. Ferritin is likely artificially elevated due to acute infection. Leukopenia resolved> now leukocytosis Suspect some dilutional component  + critical illness due to septic shock.  Underlying liver disease could explain this. -hgb dropped to 6.9 on 12/27 -- there is no clear source of bleed  -serial CBCs, monitor for bleeding -outpatient recommend iron repletion -transfuse for Hb<7 or hemodynamically significant bleeding  Coagulopathy, mild -normal LFTs  P -rechecking DIC panel   Acute liver injury-- elevated bilirubin, slowly clearing lactate, low platelets and Hb, mild coagulopathy, low albumin  -RUQ [place] -con't to monitor LFTs, coags  Hx HTN -con't holding PTA antihypertensives due to shock   Best Practice (right click and "Reselect all SmartList Selections" daily)   Diet/type: Regular consistency (see orders) DVT prophylaxis: prophylactic heparin  GI prophylaxis: PPI Lines: N/A Foley:  N/A Code Status:  full code Last date of multidisciplinary goals of care discussion [ patient updated during rounds 12/28]  Labs   CBC: Recent Labs  Lab 09/29/21 1442 09/29/21 1703 09/29/21 2133 09/29/21 2147 09/30/21 0521 09/30/21 1539 09/30/21 2124 10/01/21 0500  WBC 2.8* 1.7*  --   --  4.1  --   --  14.5*  NEUTROABS 1.5*  --   --   --  3.3  --   --  12.2*  HGB 9.4* 7.6*  --  7.1* 6.9*  --  9.4* 8.7*  HCT 30.2* 23.8*  --  21.0* 21.0*  --  29.0* 25.7*  MCV 87.8 87.5  --   --  86.4  --   --  83.2  PLT 118* 86* 89*  --  78* 89*  --  92*     Basic Metabolic Panel: Recent Labs  Lab 09/29/21 1442 09/29/21 1703 09/29/21 2147 09/30/21 0521 10/01/21 0500  NA 136  --  137 138 135  K 4.7  --  4.4 4.3 4.4  CL 106  --   --  111 110  CO2 16*  --   --  12* 14*  GLUCOSE 133*  --   --  64* 119*  BUN <5*  --   --  <5* 11  CREATININE 1.01 1.05  --  1.10 0.97  CALCIUM 8.9  --   --  7.8* 7.8*  MG 1.3*  --   --  1.5* 2.1     Julian Hy, DO 10/01/21 10:25 AM [facility]

## 2021-10-01 NOTE — Plan of Care (Signed)

## 2021-10-02 DIAGNOSIS — D61818 Other pancytopenia: Secondary | ICD-10-CM

## 2021-10-02 DIAGNOSIS — A021 Salmonella sepsis: Secondary | ICD-10-CM

## 2021-10-02 DIAGNOSIS — R6520 Severe sepsis without septic shock: Secondary | ICD-10-CM | POA: Diagnosis not present

## 2021-10-02 DIAGNOSIS — G6281 Critical illness polyneuropathy: Secondary | ICD-10-CM | POA: Diagnosis not present

## 2021-10-02 DIAGNOSIS — D696 Thrombocytopenia, unspecified: Secondary | ICD-10-CM

## 2021-10-02 LAB — COMPREHENSIVE METABOLIC PANEL
ALT: 5 U/L (ref 0–44)
AST: 8 U/L — ABNORMAL LOW (ref 15–41)
Albumin: 1.8 g/dL — ABNORMAL LOW (ref 3.5–5.0)
Alkaline Phosphatase: 67 U/L (ref 38–126)
Anion gap: 5 (ref 5–15)
BUN: 8 mg/dL (ref 6–20)
CO2: 21 mmol/L — ABNORMAL LOW (ref 22–32)
Calcium: 7.7 mg/dL — ABNORMAL LOW (ref 8.9–10.3)
Chloride: 110 mmol/L (ref 98–111)
Creatinine, Ser: 0.71 mg/dL (ref 0.61–1.24)
GFR, Estimated: >60 mL/min (ref >60)
Glucose, Bld: 106 mg/dL — ABNORMAL HIGH (ref 70–99)
Potassium: 3.3 mmol/L — ABNORMAL LOW (ref 3.5–5.1)
Sodium: 136 mmol/L (ref 135–145)
Total Bilirubin: 1.2 mg/dL (ref 0.3–1.2)
Total Protein: 4.4 g/dL — ABNORMAL LOW (ref 6.5–8.1)

## 2021-10-02 LAB — MAGNESIUM: Magnesium: 1.9 mg/dL (ref 1.7–2.4)

## 2021-10-02 LAB — PROTIME-INR
INR: 1.1 (ref 0.8–1.2)
Prothrombin Time: 14.3 seconds (ref 11.4–15.2)

## 2021-10-02 LAB — C-REACTIVE PROTEIN: CRP: 13.8 mg/dL — ABNORMAL HIGH (ref <1.0)

## 2021-10-02 LAB — GLUCOSE, CAPILLARY
Glucose-Capillary: 96 mg/dL (ref 70–99)
Glucose-Capillary: 122 mg/dL — ABNORMAL HIGH (ref 70–99)
Glucose-Capillary: 127 mg/dL — ABNORMAL HIGH (ref 70–99)
Glucose-Capillary: 107 mg/dL — ABNORMAL HIGH (ref 70–99)
Glucose-Capillary: 104 mg/dL — ABNORMAL HIGH (ref 70–99)

## 2021-10-02 LAB — BRAIN NATRIURETIC PEPTIDE: B Natriuretic Peptide: 77.8 pg/mL (ref 0.0–100.0)

## 2021-10-02 LAB — CBC WITH DIFFERENTIAL/PLATELET
Abs Immature Granulocytes: 0 10*3/uL (ref 0.00–0.07)
Basophils Absolute: 0.1 10*3/uL (ref 0.0–0.1)
Basophils Relative: 1 %
Eosinophils Absolute: 0.5 10*3/uL (ref 0.0–0.5)
Eosinophils Relative: 4 %
HCT: 19.9 % — ABNORMAL LOW (ref 39.0–52.0)
Hemoglobin: 7 g/dL — ABNORMAL LOW (ref 13.0–17.0)
Lymphocytes Relative: 10 %
Lymphs Abs: 1.2 10*3/uL (ref 0.7–4.0)
MCH: 28.8 pg (ref 26.0–34.0)
MCHC: 35.2 g/dL (ref 30.0–36.0)
MCV: 81.9 fL (ref 80.0–100.0)
Monocytes Absolute: 0.2 10*3/uL (ref 0.1–1.0)
Monocytes Relative: 2 %
Neutro Abs: 9.8 10*3/uL — ABNORMAL HIGH (ref 1.7–7.7)
Neutrophils Relative %: 83 %
Platelets: 85 10*3/uL — ABNORMAL LOW (ref 150–400)
RBC: 2.43 MIL/uL — ABNORMAL LOW (ref 4.22–5.81)
RDW: 19 % — ABNORMAL HIGH (ref 11.5–15.5)
WBC: 11.8 10*3/uL — ABNORMAL HIGH (ref 4.0–10.5)
nRBC: 0 % (ref 0.0–0.2)
nRBC: 0 /100 WBC

## 2021-10-02 LAB — D-DIMER, QUANTITATIVE: D-Dimer, Quant: 1.01 ug/mL-FEU — ABNORMAL HIGH (ref 0.00–0.50)

## 2021-10-02 LAB — LACTIC ACID, PLASMA: Lactic Acid, Venous: 1.3 mmol/L (ref 0.5–1.9)

## 2021-10-02 LAB — PROCALCITONIN: Procalcitonin: 6.6 ng/mL

## 2021-10-02 LAB — PREPARE RBC (CROSSMATCH)

## 2021-10-02 MED ORDER — METOPROLOL TARTRATE 25 MG PO TABS
25.0000 mg | ORAL_TABLET | Freq: Two times a day (BID) | ORAL | Status: DC
  Administered 2021-10-02 – 2021-10-07 (×10): 25 mg via ORAL
  Filled 2021-10-02 (×10): qty 1

## 2021-10-02 MED ORDER — SODIUM CHLORIDE 0.9% IV SOLUTION: Freq: Once | INTRAVENOUS | Status: AC

## 2021-10-02 NOTE — Progress Notes (Signed)
Occupational Therapy Treatment Patient Details Name: Evan Chambers MRN: [id number] DOB: [birth year] Today's Date: 10/02/2021   History of present illness Pt is a 60 y/o male admitted secondary to weakness and hypotension. Found to be in septic shock secondary to PNA. PMH includes L AKA, HTN, DM, ETOH use with recent admission for Wernickes encephalopathy.   OT comments  Pt progressing towards acute OT goals. Pt found to be incontinent pf bowel at start of session, pt seemed unaware. Total A for pericare, pt able to roll to each side at min guard level. Pt then came to EOB position and sat a few minutes. D/c plan remains appropriate.    Recommendations for follow up therapy are one component of a multi-disciplinary discharge planning process, led by the attending physician.  Recommendations may be updated based on patient status, additional functional criteria and insurance authorization.    Follow Up Recommendations  Skilled nursing-short term rehab (<3 hours/day)    Assistance Recommended at Discharge Frequent or constant Supervision/Assistance  Equipment Recommendations  None recommended by OT    Recommendations for Other Services      Precautions / Restrictions Precautions Precautions: Fall Precaution Comments: L AKA, uses prosthetic normally Restrictions Weight Bearing Restrictions: No       Mobility Bed Mobility Overal bed mobility: Needs Assistance Bed Mobility: Supine to Sit;Sit to Supine     Supine to sit: Min guard;HOB elevated Sit to supine: Min guard;HOB elevated        Transfers                   General transfer comment: unable to safely attempt     Balance Overall balance assessment: Needs assistance Sitting-balance support: No upper extremity supported Sitting balance-Leahy Scale: Fair                                     ADL either performed or assessed with clinical judgement   ADL Overall ADL's : Needs  assistance/impaired                             Toileting- Clothing Manipulation and Hygiene: Total assistance;Bed level Toileting - Clothing Manipulation Details (indicate cue type and reason): pt found to be incontintient of bowel       General ADL Comments: rolled and completed bed mobility including scooting up in bed with no physical assist. Fair sitting balance    Extremity/Trunk Assessment Upper Extremity Assessment Upper Extremity Assessment: Generalized weakness   Lower Extremity Assessment Lower Extremity Assessment: Defer to PT evaluation        Vision       Perception     Praxis      Cognition Arousal/Alertness: Awake/alert Behavior During Therapy: Flat affect Overall Cognitive Status: No family/caregiver present to determine baseline cognitive functioning                                 General Comments: follows one-step commands consistently. Some confusion noted (receptive/expressive difficulty?). tangential responses.          Exercises     Shoulder Instructions       General Comments HR 110-117 throughout session    Pertinent Vitals/ Pain       Pain Assessment: No/denies pain  Home Living  Prior Functioning/Environment              Frequency  Min 2X/week        Progress Toward Goals  OT Goals(current goals can now be found in the care plan section)  Progress towards OT goals: Progressing toward goals  Acute Rehab OT Goals Patient Stated Goal: did not state OT Goal Formulation: With patient Time For Goal Achievement: 10/14/21 Potential to Achieve Goals: Good ADL Goals Pt Will Perform Upper Body Bathing: with set-up;sitting Pt Will Perform Lower Body Bathing: sit to/from stand;with min assist Pt Will Perform Upper Body Dressing: with set-up;sitting Pt Will Perform Lower Body Dressing: with min assist;sit to/from stand Pt Will Transfer to  Toilet: with min assist;stand pivot transfer;bedside commode Pt Will Perform Toileting - Clothing Manipulation and hygiene: with min assist;sit to/from stand  Plan Discharge plan remains appropriate    Co-evaluation                 AM-PAC OT "6 Clicks" Daily Activity     Outcome Measure   Help from another person eating meals?: A Little Help from another person taking care of personal grooming?: A Little Help from another person toileting, which includes using toliet, bedpan, or urinal?: Total Help from another person bathing (including washing, rinsing, drying)?: A Lot Help from another person to put on and taking off regular upper body clothing?: A Lot Help from another person to put on and taking off regular lower body clothing?: Total 6 Click Score: 12    End of Session    OT Visit Diagnosis: Unsteadiness on feet (R26.81);Cognitive communication deficit (R41.841)   Activity Tolerance Patient tolerated treatment well   Patient Left in bed;with call bell/phone within reach;with bed alarm set;Other (comment);with SCD's reapplied (did not alter bed alarm)   Nurse Communication Other (comment) (pt with episode of bowel incontinence)        Time: 8115-7262 OT Time Calculation (min): 31 min  Charges: OT General Charges $OT Visit: 1 Visit OT Treatments $Self Care/Home Management : 23-37 mins  Tyrone Schimke, OT Acute Rehabilitation Services Office: [phone]   Hortencia Pilar 10/02/2021, 1:29 PM

## 2021-10-02 NOTE — TOC Initial Note (Signed)
Transition of Care [facility]) - Initial/Assessment Note    Patient Details  Name: CHARLI LIBERATORE MRN: [id number] Date of Birth: August 09, 1961  Transition of Care [facility] CM/SW Contact:    Clotee Schlicker C Tarpley-Carter, [place] Phone Number: 10/02/2021, 12:59 PM  Clinical Narrative:                  Transition of Care (TOC) Screening Note   Patient Details  Name: WISE FEES Date of Birth: 1960/12/29   Transition of Care (TOC) CM/SW Contact:    Elvi Leventhal C Tarpley-Carter, [place] Phone Number: 10/02/2021, 1:01 PM    Transition of Care Department (TOC) has reviewed patient and no TOC needs have been identified at this time. We will continue to monitor patient advancement through interdisciplinary progression rounds. If new patient transition needs arise, please place a TOC consult.          Patient Goals and CMS Choice        Expected Discharge Plan and Services                                                Prior Living Arrangements/Services                       Activities of Daily Living Home Assistive Devices/Equipment: Eyeglasses, Prosthesis, Cane (specify quad or straight) ADL Screening (condition at time of admission) Patient's cognitive ability adequate to safely complete daily activities?: Yes Is the patient deaf or have difficulty hearing?: No Does the patient have difficulty seeing, even when wearing glasses/contacts?: Yes Does the patient have difficulty concentrating, remembering, or making decisions?: No Patient able to express need for assistance with ADLs?: Yes Does the patient have difficulty dressing or bathing?: Yes Independently performs ADLs?: No Communication: Independent Does the patient have difficulty walking or climbing stairs?: Yes Weakness of Legs: None Weakness of Arms/Hands: None  Permission Sought/Granted                  Emotional Assessment              Admission diagnosis:  Sepsis ([place]) [A41.9] Septic  shock ([place]) [A41.9, R65.21] Patient Active Problem List   Diagnosis Date Noted   Septic shock ([place]) 09/30/2021   Sepsis ([place]) 09/29/2021   Malnutrition of moderate degree 07/08/2021   Encephalopathy acute 06/24/2021   Type 2 diabetes mellitus ([place]) 06/23/2021   Hypertension associated with diabetes ([place]) 06/23/2021   Hyperkalemia 53/61/4431   Acute metabolic encephalopathy 54/00/8676   Acute renal failure (ARF) ([place]) 19/50/9326   Metabolic acidosis, increased anion gap 03/16/2019   Dehydration 07/17/2017   Dysphagia 07/17/2017   Hyponatremia 07/17/2017   Hiatal hernia    Lower esophageal ring    Chest pain 11/14/2016   Elevated ETOH level 11/14/2016   Hypokalemia 11/14/2016   Elevated blood sugar level 11/14/2016   Personal history of prostate cancer 11/14/2016   PCP:  Lucianne Lei, MD Pharmacy:   [facility] [place], [place] - [phone] [place] [address] Phone: [phone] Fax: [phone]     Social Determinants of Health (SDOH) Interventions    Readmission Risk Interventions No flowsheet data found.
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
## 2021-10-02 NOTE — Progress Notes (Signed)
PIV consult to assess R forearm site. Pt initially reported pain at site. Requested to finish his meal before starting new IV. Returned after :30, pt now denies pain in R forearm site. L upper arm midline found to be malpositioned, painful when flushed. New PIV placed L forearm with [place].

## 2021-10-02 NOTE — Progress Notes (Signed)
NAME:  Evan Chambers, MRN:  [id number], DOB:  April 28, 1961, LOS: 3 ADMISSION DATE:  09/29/2021, CONSULTATION DATE:  09/30/21 REFERRING MD:  Candiss Norse - TRH, CHIEF COMPLAINT:  PNA    History of Present Illness:  60 yo M PMH DM2, s/p L AKA, prior aspiration PNA, EtOH use disorder, Wernickes encephalopathy, empyema s/p chest tube and VATS 06/2021, who presented to ED from [facility] 12/26 with generalized weakness. Pt reports that at his nursing facility he had a low BP reading but does not know what this was, and endorses associated transient lightheadedness which has now improved. In ED hypotensive 79/52, leukopenic.  Admitted to [facility] for management of severe sepsis due to PNA. Tx with vanc, pip tazo, steroids, midodrine and IVF.   Despite IVF (4L bolus, midodrine, steroids, pt remains hypotensive 12/27. PCCM consulted in this setting   Notably on admission Pancytopenic -- WBC 2.8 RBC 3.44 Hgb 9.4 Plt 118  LA 8.4  Pertinent  Medical History  Empyema; previous decortication on R L AKA DM2 HTN HLD Prostate cancer s/p prostatectomy Aspiration   Significant Hospital Events: Including procedures, antibiotic start and stop dates in addition to other pertinent events   12/26 admitted to [facility] for sepsis due to PNA. Bilateral opacities on CT chest. Small r pleuiral effusion. Hypotensive 12/27 remains borderline hypotensive with SBP 90s MAP 66. PCCM consult. Transferring to ICU   Interim History / Subjective:  Feels well, but has some chest congestion. Denies edema, SOB.  Objective   Blood pressure 124/73, pulse (!) 113, temperature 98.3 F (36.8 C), temperature source Axillary, resp. rate (!) 22, weight 54.5 kg, SpO2 100 %.        Intake/Output Summary (Last 24 hours) at 10/02/2021 1350 Last data filed at 10/02/2021 1200 Gross per 24 hour  Intake 2897.37 ml  Output 775 ml  Net 2122.37 ml    Filed Weights   09/30/21 1500  Weight: 54.5 kg    Examination: General: middle aged man sitting up  in bed in NAD HENT: [place]/AT, eyes anicteric Lungs: breathing comfortably on RA, CTAB Cardiovascular: S1S2, tachycardic, reg rhythm Abdomen: soft, NT Extremities: mild R foot edema, L AKA Neuro: awake, alert, answering questions appropriately but not oriented to time or place  Derm: warm, dry, no rashes  LA 1.3 WBC 11.8 H/H 7/19.9 Platelets 85 K+ 3.3 BUN 8 Cr 0.71 Blood cx> NGTD RVP negative  RUQ US> normal looking liver  Resolved Hospital Problem list     Assessment & Plan:   Acute encephalopathy, metabolic> resolved  -sepsis, hypoglycemia  -unclear baseline  Hx of Wernicke's encephalopathy Ongoing EtOH abuse > reports this is several days per week, several drinks, but not daily use -CIWA for ativan -vitamins, regular diet to encourage good nutrition -has been counseled on the importance of avoiding ETOH -delirium precautions  Septic shock due to PNA vs colitis, severe lactic acidosis Multifactorial PNA R>L (POA) Hx aspiration  No evidence of heart failure- ECHO LVEF 60-65%, moderately reduced RV systolic function, moderately enlarged RV. Trop WNL. BNP slightly elevated at 175  -follow blood culture until finalized -have not been able to collect sputum culture -con't antibiotics, anticipate 7 days total -ok to add back metoprolol today  NAGMA due to septic shock, colitis. Possibly significant GI losses, although he does not report significant diarrhea. -con't to monitor BMP -switch off LR to plasmalyte due to concern for inability to break down lactic acid  DM2 with acute hypoglycemia > resolved -can d/c monitoring since not  needing insulin  Hypomagnesemia , resolved -monitor  Prolonged QTc -avoid Qtc prolonging meds  Thrombocytopenia and acute on chronic anemia. Suspect iron-deficiency anemia. Ferritin is likely artificially elevated due to acute infection. Leukopenia resolved> now leukocytosis Suspect some dilutional component  + critical illness due to  septic shock. Underlying liver disease could explain this. Hematologic disease could explain bicytopenia -hgb dropped to 6.9 on 12/27 -- there is no clear source of bleed  -con't monitoring for bleeding -outpatient recommend iron repletion -Hematology consult  Coagulopathy, mild- resolved -no additional monitoring  Acute liver injury-- elevated bilirubin, slowly clearing lactate, low platelets and Hb, mild coagulopathy, low albumin. Liver looks fine on RUQ [place] & abd CT. Potentially due to sepsis. -monitor  Hx HTN -ok to resume PTA metoprolol   Tachycardia- potentially symptom of anemia -Does not seem high risk for PE, but can check LE doppler and d-dimer -Transfuse 1 unit pRBCs -Resume PTA metoprolol 25mg  BID  Stable to transfer out of ICU today. TRH to assume care tomorrow.  Best Practice (right click and "Reselect all SmartList Selections" daily)   Diet/type: Regular consistency (see orders) DVT prophylaxis: prophylactic heparin  GI prophylaxis: PPI Lines: N/A Foley:  N/A Code Status:  full code Last date of multidisciplinary goals of care discussion [ patient updated during rounds 12/28]  Labs   CBC: Recent Labs  Lab 09/29/21 1442 09/29/21 1703 09/29/21 2133 09/29/21 2147 09/30/21 0521 09/30/21 1539 09/30/21 2124 10/01/21 0500 10/02/21 0437  WBC 2.8* 1.7*  --   --  4.1  --   --  14.5* 11.8*  NEUTROABS 1.5*  --   --   --  3.3  --   --  12.2* 9.8*  HGB 9.4* 7.6*  --  7.1* 6.9*  --  9.4* 8.7* 7.0*  HCT 30.2* 23.8*  --  21.0* 21.0*  --  29.0* 25.7* 19.9*  MCV 87.8 87.5  --   --  86.4  --   --  83.2 81.9  PLT 118* 86* 89*  --  78* 89*  --  92* 85*     Basic Metabolic Panel: Recent Labs  Lab 09/29/21 1442 09/29/21 1703 09/29/21 2147 09/30/21 0521 10/01/21 0500 10/02/21 0437  NA 136  --  137 138 135 136  K 4.7  --  4.4 4.3 4.4 3.3*  CL 106  --   --  111 110 110  CO2 16*  --   --  12* 14* 21*  GLUCOSE 133*  --   --  64* 119* 106*  BUN <5*  --   --  <5*  11 8  CREATININE 1.01 1.05  --  1.10 0.97 0.71  CALCIUM 8.9  --   --  7.8* 7.8* 7.7*  MG 1.3*  --   --  1.5* 2.1 1.9     Julian Hy, DO 10/02/21 2:20 PM [facility]

## 2021-10-02 NOTE — Plan of Care (Signed)
  Problem: Health Behavior/Discharge Planning: Goal: Ability to manage health-related needs will improve Outcome: Progressing   Problem: Clinical Measurements: Goal: Will remain free from infection Outcome: Progressing Goal: Diagnostic test results will improve Outcome: Progressing   

## 2021-10-03 ENCOUNTER — Inpatient Hospital Stay (HOSPITAL_COMMUNITY): Payer: Medicare HMO

## 2021-10-03 DIAGNOSIS — R609 Edema, unspecified: Secondary | ICD-10-CM

## 2021-10-03 DIAGNOSIS — E872 Acidosis, unspecified: Secondary | ICD-10-CM | POA: Diagnosis not present

## 2021-10-03 DIAGNOSIS — A419 Sepsis, unspecified organism: Secondary | ICD-10-CM | POA: Diagnosis not present

## 2021-10-03 DIAGNOSIS — R6521 Severe sepsis with septic shock: Secondary | ICD-10-CM | POA: Diagnosis not present

## 2021-10-03 DIAGNOSIS — D61818 Other pancytopenia: Secondary | ICD-10-CM | POA: Diagnosis not present

## 2021-10-03 LAB — COMPREHENSIVE METABOLIC PANEL
ALT: 9 U/L (ref 0–44)
AST: 10 U/L — ABNORMAL LOW (ref 15–41)
Albumin: 1.8 g/dL — ABNORMAL LOW (ref 3.5–5.0)
Alkaline Phosphatase: 114 U/L (ref 38–126)
Anion gap: 4 — ABNORMAL LOW (ref 5–15)
BUN: <5 mg/dL — ABNORMAL LOW (ref 6–20)
CO2: 24 mmol/L (ref 22–32)
Calcium: 7.9 mg/dL — ABNORMAL LOW (ref 8.9–10.3)
Chloride: 111 mmol/L (ref 98–111)
Creatinine, Ser: 0.58 mg/dL — ABNORMAL LOW (ref 0.61–1.24)
GFR, Estimated: >60 mL/min (ref >60)
Glucose, Bld: 90 mg/dL (ref 70–99)
Potassium: 3.1 mmol/L — ABNORMAL LOW (ref 3.5–5.1)
Sodium: 139 mmol/L (ref 135–145)
Total Bilirubin: 2 mg/dL — ABNORMAL HIGH (ref 0.3–1.2)
Total Protein: 4.6 g/dL — ABNORMAL LOW (ref 6.5–8.1)

## 2021-10-03 LAB — BPAM RBC
Blood Product Expiration Date: 202212312359
Blood Product Expiration Date: 202301132359
ISSUE DATE / TIME: 202212271734
ISSUE DATE / TIME: 202212291423
Unit Type and Rh: 6200
Unit Type and Rh: 6200

## 2021-10-03 LAB — CBC WITH DIFFERENTIAL/PLATELET
Abs Immature Granulocytes: 0.03 10*3/uL (ref 0.00–0.07)
Basophils Absolute: 0 10*3/uL (ref 0.0–0.1)
Basophils Relative: 0 %
Eosinophils Absolute: 0.3 10*3/uL (ref 0.0–0.5)
Eosinophils Relative: 3 %
HCT: 22 % — ABNORMAL LOW (ref 39.0–52.0)
Hemoglobin: 7.5 g/dL — ABNORMAL LOW (ref 13.0–17.0)
Immature Granulocytes: 0 %
Lymphocytes Relative: 16 %
Lymphs Abs: 1.4 10*3/uL (ref 0.7–4.0)
MCH: 28 pg (ref 26.0–34.0)
MCHC: 34.1 g/dL (ref 30.0–36.0)
MCV: 82.1 fL (ref 80.0–100.0)
Monocytes Absolute: 0.8 10*3/uL (ref 0.1–1.0)
Monocytes Relative: 9 %
Neutro Abs: 6.3 10*3/uL (ref 1.7–7.7)
Neutrophils Relative %: 72 %
Platelets: 88 10*3/uL — ABNORMAL LOW (ref 150–400)
RBC: 2.68 MIL/uL — ABNORMAL LOW (ref 4.22–5.81)
RDW: 18.7 % — ABNORMAL HIGH (ref 11.5–15.5)
WBC: 8.9 10*3/uL (ref 4.0–10.5)
nRBC: 0 % (ref 0.0–0.2)

## 2021-10-03 LAB — TYPE AND SCREEN
ABO/RH(D): A POS
Antibody Screen: NEGATIVE
Unit division: 0
Unit division: 0

## 2021-10-03 LAB — MAGNESIUM: Magnesium: 1.7 mg/dL (ref 1.7–2.4)

## 2021-10-03 LAB — GLUCOSE, CAPILLARY
Glucose-Capillary: 105 mg/dL — ABNORMAL HIGH (ref 70–99)
Glucose-Capillary: 91 mg/dL (ref 70–99)
Glucose-Capillary: 114 mg/dL — ABNORMAL HIGH (ref 70–99)
Glucose-Capillary: 95 mg/dL (ref 70–99)

## 2021-10-03 LAB — PROTIME-INR
INR: 1 (ref 0.8–1.2)
Prothrombin Time: 13.5 seconds (ref 11.4–15.2)

## 2021-10-03 LAB — PROCALCITONIN: Procalcitonin: 2.51 ng/mL

## 2021-10-03 LAB — C-REACTIVE PROTEIN: CRP: 8.3 mg/dL — ABNORMAL HIGH (ref <1.0)

## 2021-10-03 LAB — BRAIN NATRIURETIC PEPTIDE: B Natriuretic Peptide: 550.1 pg/mL — ABNORMAL HIGH (ref 0.0–100.0)

## 2021-10-03 MED ORDER — POTASSIUM CHLORIDE CRYS ER 20 MEQ PO TBCR
40.0000 meq | EXTENDED_RELEASE_TABLET | ORAL | Status: AC
  Administered 2021-10-03 (×2): 40 meq via ORAL
  Filled 2021-10-03 (×2): qty 2

## 2021-10-03 NOTE — Progress Notes (Signed)
Physical Therapy Treatment Patient Details Name: Evan Chambers MRN: [id number] DOB: 31-Dec-1960 Today's Date: 10/03/2021   History of Present Illness Pt is a 60 y/o male admitted secondary to weakness and hypotension. Found to be in septic shock secondary to PNA. PMH includes L AKA, HTN, DM, ETOH use with recent admission for Wernickes encephalopathy.    PT Comments    Pt demos improved activity tolerance today, able to transfer to chair with CGA only.  Amb held at this time due to elevated HR with minimal activity and continued confusion making it unsafe to leave bedside/chair without 2nd assist to manage chair.  Pt would benefit of amb trial next tx session with chair follow.   Recommendations for follow up therapy are one component of a multi-disciplinary discharge planning process, led by the attending physician.  Recommendations may be updated based on patient status, additional functional criteria and insurance authorization.  Follow Up Recommendations  Skilled nursing-short term rehab (<3 hours/day) (Pt. is unreliable historian, according to medical record is from SNF but pt states he's from home.  If from SNF, will be safe to return.  If from home, will need to determine level of support available.)     Assistance Recommended at Discharge Set up Supervision/Assistance  Equipment Recommendations  Rolling walker (2 wheels) (Prosthetic not currently available to determine AD needs with use of prosthetic.)    Recommendations for Other Services       Precautions / Restrictions Precautions Precautions: Fall     Mobility  Bed Mobility                 Patient Response: Cooperative  Transfers Overall transfer level: Needs assistance Equipment used: Rolling walker (2 wheels) Transfers: Sit to/from Stand;Bed to chair/wheelchair/BSC Sit to Stand: Min guard     Step pivot transfers: Min guard     General transfer comment: Pt. performs sit > stand with RW and CGA, VCs  for safety with hand placement.  Demos good standing balance and is able to complete 3 pt gait to take small hops over to chair.  Pt's HR jumps into 120s with minimal activity and due to elevated HR/confusion decision is made to not amb further away from chair/bed without assist.    Ambulation/Gait                   Stairs             Wheelchair Mobility    Modified Rankin (Stroke Patients Only)       Balance Overall balance assessment: Modified Independent                                          Cognition Arousal/Alertness: Awake/alert Behavior During Therapy: WFL for tasks assessed/performed Overall Cognitive Status: Impaired/Different from baseline Area of Impairment: Orientation;Memory                 Orientation Level: Person   Memory: Decreased short-term memory         General Comments: Pt. is sitting up EOB when PT arrives.  Pleasantly confused.  Does not realize why he is in hospital or for how long.  Needs to be reoriented.  After telling the pt the day of week/date, pt asks the questions again every couple of minutes.  Poor recall but agreeable to try to get to chair.  Exercises      General Comments        Pertinent Vitals/Pain Pain Assessment: No/denies pain    Home Living                          Prior Function            PT Goals (current goals can now be found in the care plan section) Progress towards PT goals: Progressing toward goals    Frequency    Min 2X/week      PT Plan Current plan remains appropriate    Co-evaluation              AM-PAC PT "6 Clicks" Mobility   Outcome Measure  Help needed turning from your back to your side while in a flat bed without using bedrails?: None Help needed moving from lying on your back to sitting on the side of a flat bed without using bedrails?: None Help needed moving to and from a bed to a chair (including a wheelchair)?: A  Little Help needed standing up from a chair using your arms (e.g., wheelchair or bedside chair)?: A Little Help needed to walk in hospital room?: A Little Help needed climbing 3-5 steps with a railing? : A Lot 6 Click Score: 19    End of Session Equipment Utilized During Treatment: Gait belt Activity Tolerance: Patient tolerated treatment well Patient left: in chair;with call bell/phone within reach;with chair alarm set;with nursing/sitter in room Nurse Communication: Mobility status       Time: 1005-1026 PT Time Calculation (min) (ACUTE ONLY): 21 min  Charges:  $Therapeutic Activity: 8-22 mins                     Carys Malina A. Derak Schurman, PT, DPT Acute Rehabilitation Services Office: [place] 10/03/2021, 10:43 AM

## 2021-10-03 NOTE — Progress Notes (Signed)
[facility]  PROGRESS NOTE  Evan Chambers AST:[id number] DOB: [birth year] DOA: 09/29/2021 PCP: Lucianne Lei, MD   Brief HPI:   60 yr old male with past medical history of diabetes mellitus type 2, s/p left AKA, prior aspiration pneumonia, alcohol use disorder, Warnicke's encephalopathy, empyema s/p chest tube and VATS in September 2022 who presented to ED from skilled nursing facility on 12/26 with generalized weakness.  Patient was found to have low BP reading, transient lightheadedness.  He was found to be hypotensive in the ED with blood pressure 79/52, leukopenia.  He was admitted to [facility] for management of severe sepsis due to pneumonia, treated with vancomycin, zosyn, steroids, midodrine and IV fluids.  Despite all these measures patient remained hypotensive so PCCM was consulted.  Patient was transferred to [facility] service.  Sepsis physiology has resolved.  He was also found to be pancytopenic, hematology/oncology were consulted.  Pancytopenia has resolved. Patient was stable for transfer to floor [facility] resumed care on 10/03/2021    Subjective   This morning patient is confused.  Oriented to self only.   Assessment/Plan:    Metabolic encephalopathy -Patient still confused, he has history of Warnicke's encephalopathy; likely worsened in setting of sepsis/ICU delirium -Has history of ongoing alcohol abuse; no signs and symptoms of alcohol withdrawal -MRI brain from September 2022 showed Wernicke's encephalopathy -Continue thiamine, folate, B12 supplementation -Discussed with neurology, they recommend repeating an MRI brain -Neurology will see patient in consultation  Pancytopenia -Likely in the setting of sepsis/alcohol abuse -Resolved -Hematology/oncology has signed off  Anemia -Hemoglobin 7.5 this morning -Likely in setting of pancytopenia as above -He has iron-deficiency anemia, hematology does not recommend IV iron at this time due to acute illness -If continues to have  anemia in 2 to 3 weeks, may need GI evaluation as outpatient  Septic shock/lactic acidosis -Resolved -Likely from pneumonia -Improved with antibiotics -Blood pressure has normalized  Non-anion gap metabolic acidosis -Resolved -We will discontinue Plasma-Lyte  Hypokalemia -Replace potassium and follow BMP in am  Acute liver injury -Resolved -Likely in setting of hypotension/septic shock -Abdominal ultrasound unremarkable -Liver enzymes have improved  History of hypertension -Metoprolol has been resumed  Medications     sodium chloride   Intravenous Once   Chlorhexidine Gluconate Cloth  6 each Topical Daily   folic acid  1 mg Oral Daily   heparin injection (subcutaneous)  5,000 Units Subcutaneous Q8H   metoprolol tartrate  25 mg Oral BID   multivitamin with minerals  1 tablet Oral Daily   pantoprazole  40 mg Oral Daily   potassium chloride  40 mEq Oral Q4H   sodium chloride flush  10-40 mL Intracatheter Q12H   sodium chloride flush  3 mL Intravenous Q12H   thiamine  100 mg Oral Daily   Or   thiamine  100 mg Intravenous Daily   vitamin B-12  1,000 mcg Oral Daily     Data Reviewed:   CBG:  Recent Labs  Lab 10/02/21 1140 10/02/21 1533 10/02/21 2023 10/03/21 1023 10/03/21 1201  GLUCAP 127* 107* 104* 105* 91    SpO2: 99 % O2 Flow Rate (L/min): 2 L/min    Vitals:   10/02/21 1535 10/02/21 1658 10/03/21 0520 10/03/21 0724  BP:  115/79 125/80 113/81  Pulse:  (!) 107 (!) 102 99  Resp:  20 (!) 23 18  Temp: 98.4 F (36.9 C) 99.1 F (37.3 C)  98.9 F (37.2 C)  TempSrc: Oral Oral  Oral  SpO2:  100%  100% 99%  Weight:         Intake/Output Summary (Last 24 hours) at 10/03/2021 1405 Last data filed at 10/03/2021 0947 Gross per 24 hour  Intake 565.07 ml  Output 250 ml  Net 315.07 ml    12/28 1901 - 12/30 0700 In: 3032.1 [P.O.:120; I.V.:2498] Out: 1025 [Urine:1025]  Filed Weights   09/30/21 1500  Weight: 54.5 kg    Data Reviewed: Basic  Metabolic Panel: Recent Labs  Lab 09/29/21 1442 09/29/21 1703 09/29/21 2147 09/30/21 0521 10/01/21 0500 10/02/21 0437 10/03/21 0912  NA 136  --  137 138 135 136 139  K 4.7  --  4.4 4.3 4.4 3.3* 3.1*  CL 106  --   --  111 110 110 111  CO2 16*  --   --  12* 14* 21* 24  GLUCOSE 133*  --   --  64* 119* 106* 90  BUN <5*  --   --  <5* 11 8 <5*  CREATININE 1.01 1.05  --  1.10 0.97 0.71 0.58*  CALCIUM 8.9  --   --  7.8* 7.8* 7.7* 7.9*  MG 1.3*  --   --  1.5* 2.1 1.9 1.7   Liver Function Tests: Recent Labs  Lab 09/29/21 1442 09/30/21 0521 10/01/21 0500 10/02/21 0437 10/03/21 0912  AST 18 14* 14* 8* 10*  ALT 9 7 10 5 9   ALKPHOS 56 35* 40 67 114  BILITOT 1.3* 1.4* 1.3* 1.2 2.0*  PROT 6.5 4.4* 4.5* 4.4* 4.6*  ALBUMIN 3.1* 2.0* 1.9* 1.8* 1.8*   No results for input(s): LIPASE, AMYLASE in the last 168 hours. No results for input(s): AMMONIA in the last 168 hours. CBC: Recent Labs  Lab 09/29/21 1442 09/29/21 1703 09/29/21 2133 09/30/21 0521 09/30/21 1539 09/30/21 2124 10/01/21 0500 10/02/21 0437 10/03/21 0912  WBC 2.8* 1.7*  --  4.1  --   --  14.5* 11.8* 8.9  NEUTROABS 1.5*  --   --  3.3  --   --  12.2* 9.8* 6.3  HGB 9.4* 7.6*   < > 6.9*  --  9.4* 8.7* 7.0* 7.5*  HCT 30.2* 23.8*   < > 21.0*  --  29.0* 25.7* 19.9* 22.0*  MCV 87.8 87.5  --  86.4  --   --  83.2 81.9 82.1  PLT 118* 86*   < > 78* 89*  --  92* 85* 88*   < > = values in this interval not displayed.   Cardiac Enzymes: No results for input(s): CKTOTAL, CKMB, CKMBINDEX, TROPONINI in the last 168 hours. BNP (last 3 results) Recent Labs    10/01/21 0500 10/02/21 0437 10/03/21 0912  BNP 237.2* 77.8 550.1*    ProBNP (last 3 results) No results for input(s): PROBNP in the last 8760 hours.  CBG: Recent Labs  Lab 10/02/21 1140 10/02/21 1533 10/02/21 2023 10/03/21 1023 10/03/21 1201  GLUCAP 127* 107* 104* 105* 91       Radiology Reports  VAS [place] LOWER EXTREMITY VENOUS (DVT)  Result Date:  10/03/2021  Lower Venous DVT Study Patient Name:  Evan Chambers  Date of Exam:   10/03/2021 Medical Rec #: [id number]        Accession #:    [id number] Date of Birth: July 03, 1961        Patient Gender: M Patient Age:   60 years Exam Location:  [facility] Procedure:      VAS [place] LOWER EXTREMITY VENOUS (DVT) Referring Phys: Noemi Chapel --------------------------------------------------------------------------------  Indications: Edema.  Risk Factors: Left AKA. Comparison Study: No prior study Performing Technologist: Maudry Mayhew MHA, RDMS, RVT, RDCS  Examination Guidelines: A complete evaluation includes B-mode imaging, spectral Doppler, color Doppler, and power Doppler as needed of all accessible portions of each vessel. Bilateral testing is considered an integral part of a complete examination. Limited examinations for reoccurring indications may be performed as noted. The reflux portion of the exam is performed with the patient in reverse Trendelenburg.  +---------+---------------+---------+-----------+----------+--------------+  RIGHT     Compressibility Phasicity Spontaneity Properties Thrombus Aging  +---------+---------------+---------+-----------+----------+--------------+  CFV       Full            Yes       Yes                                    +---------+---------------+---------+-----------+----------+--------------+  FV Prox   Full                                                             +---------+---------------+---------+-----------+----------+--------------+  FV Mid    Full                                                             +---------+---------------+---------+-----------+----------+--------------+  FV Distal Full                                                             +---------+---------------+---------+-----------+----------+--------------+  PFV       Full                                                              +---------+---------------+---------+-----------+----------+--------------+  POP       Full            Yes       Yes                                    +---------+---------------+---------+-----------+----------+--------------+  PTV       Full                                                             +---------+---------------+---------+-----------+----------+--------------+  PERO      Full                                                             +---------+---------------+---------+-----------+----------+--------------+   +---------+---------------+---------+-----------+----------+--------------+  LEFT      Compressibility Phasicity Spontaneity Properties Thrombus Aging  +---------+---------------+---------+-----------+----------+--------------+  CFV       Full            Yes       Yes                                    +---------+---------------+---------+-----------+----------+--------------+  FV Prox   Full                                                             +---------+---------------+---------+-----------+----------+--------------+  FV Mid    Full                                                             +---------+---------------+---------+-----------+----------+--------------+  FV Distal Full                                                             +---------+---------------+---------+-----------+----------+--------------+  PFV       Full                                                             +---------+---------------+---------+-----------+----------+--------------+ Left AKA    Summary: RIGHT: - There is no evidence of deep vein thrombosis in the lower extremity.  - No cystic structure found in the popliteal fossa.  LEFT: - There is no evidence of deep vein thrombosis in the lower extremity. However, portions of this examination were limited- see technologist comments above.  *See table(s) above for measurements and observations.    Preliminary    US Abdomen Limited RUQ  (LIVER/GB)  Result Date: 10/01/2021 CLINICAL DATA:  Sepsis ETOH abuse EXAM: ULTRASOUND ABDOMEN LIMITED RIGHT UPPER QUADRANT COMPARISON:  CT 09/30/2021 FINDINGS: Gallbladder: No gallstones or wall thickening visualized. No sonographic Murphy sign noted by sonographer. Common bile duct: Diameter: 3.9 mm Liver: No focal lesion identified. Within normal limits in parenchymal echogenicity. Portal vein is patent on color Doppler imaging with normal direction of blood flow towards the liver. Other: Small right pleural effusion. Trace perinephric fluid on the right. Right kidney cortex may be slightly echogenic. IMPRESSION: 1. Negative for gallstones. 2. Ultrasound appearance of the liver is within normal limits 3. Small right pleural effusion 4. Trace right perinephric fluid. Right kidney cortex appears slightly echogenic suggesting medical renal disease Electronically Signed   By: Donavan Foil M.D.   On: 10/01/2021 17:00       Antibiotics: Anti-infectives (From admission, onward)    Start     Dose/Rate Route Frequency Ordered Stop   09/30/21 0215  piperacillin-tazobactam (ZOSYN) IVPB 3.375 g  See Hyperspace for full Linked Orders Report.   3.375 g 12.5 mL/hr over 240 Minutes Intravenous Every 8 hours 09/29/21 1803 10/05/21 2359   09/29/21 1930  vancomycin (VANCOREADY) IVPB 1500 mg/300 mL  Status:  Discontinued        1,500 mg 150 mL/hr over 120 Minutes Intravenous Every 24 hours 09/29/21 1930 10/01/21 1053   09/29/21 1815  piperacillin-tazobactam (ZOSYN) IVPB 3.375 g       See Hyperspace for full Linked Orders Report.   3.375 g 100 mL/hr over 30 Minutes Intravenous  Once 09/29/21 1803 09/29/21 2019   09/29/21 1815  vancomycin (VANCOREADY) IVPB 1500 mg/300 mL  Status:  Discontinued        1,500 mg 150 mL/hr over 120 Minutes Intravenous  Once 09/29/21 1803 09/29/21 1930   09/29/21 1600  cefTRIAXone (ROCEPHIN) 2 g in sodium chloride 0.9 % 100 mL IVPB  Status:  Discontinued        2 g 200  mL/hr over 30 Minutes Intravenous Every 24 hours 09/29/21 1557 09/29/21 1702   09/29/21 1600  azithromycin (ZITHROMAX) 500 mg in sodium chloride 0.9 % 250 mL IVPB  Status:  Discontinued        500 mg 250 mL/hr over 60 Minutes Intravenous Every 24 hours 09/29/21 1557 09/29/21 1702         DVT prophylaxis:   Code Status: Full code  Family Communication: No family at bedside   Consultants: PCCM Hematology/oncology  Procedures:     Objective    Physical Examination:   General-appears in no acute distress Heart-S1-S2, regular, no murmur auscultated Lungs-clear to auscultation bilaterally, no wheezing or crackles auscultated Abdomen-soft, nontender, no organomegaly Extremities-no edema in the lower extremities Neuro-alert, oriented to self only, no focal deficit noted  Status is: Inpatient  Dispo: The patient is from: Skilled nursing facility              Anticipated d/c is to: Skilled nursing facility              Anticipated d/c date is: 10-06-21              Patient currently not stable for discharge  Barrier to discharge-ongoing evaluation for encephalopathy  COVID-19 Labs  Recent Labs    09/30/21 1539 10/01/21 0500 10/02/21 0437 10/02/21 1426 10/03/21 0912  DDIMER 0.54*  --   --  1.01*  --   FERRITIN 156  --   --   --   --   CRP  --  18.4* 13.8*  --  8.3*    Lab Results  Component Value Date   SARSCOV2NAA NEGATIVE 09/30/2021   [place] NEGATIVE 09/18/2021   [place] NEGATIVE 07/15/2021   [place] NEGATIVE 06/23/2021            Recent Results (from the past 240 hour(s))  MRSA Next Gen by PCR, Nasal     Status: None   Collection Time: 09/29/21  5:04 PM   Specimen: Nasal Mucosa; Nasal Swab  Result Value Ref Range Status   MRSA by PCR Next Gen NOT DETECTED NOT DETECTED Final    Comment: (NOTE) The GeneXpert MRSA Assay (FDA approved for NASAL specimens only), is one component of a comprehensive MRSA colonization  surveillance program. It is not intended to diagnose MRSA infection nor to guide or monitor treatment for MRSA infections. Test performance is not FDA approved in patients less than 26 years old. Performed at [facility], [place] [address], [place], [place] [address]  Blood culture (routine x 2)     Status: None (Preliminary result)   Collection Time: 09/29/21  7:20 PM   Specimen: BLOOD  Result Value Ref Range Status   Specimen Description BLOOD LEFT ANTECUBITAL  Final   Special Requests   Final    BOTTLES DRAWN AEROBIC AND ANAEROBIC Blood Culture results may not be optimal due to an inadequate volume of blood received in culture bottles   Culture   Final    NO GROWTH 4 DAYS Performed at [facility], [place] [address] [place], [place] [address]    Report Status PENDING  Incomplete  Resp Panel by RT-PCR (Flu A&B, Covid) Nasopharyngeal Swab     Status: None   Collection Time: 09/30/21  4:01 PM   Specimen: Nasopharyngeal Swab; Nasopharyngeal(NP) swabs in vial transport medium  Result Value Ref Range Status   SARS Coronavirus 2 by RT PCR NEGATIVE NEGATIVE Final    Comment: (NOTE) SARS-CoV-2 target nucleic acids are NOT DETECTED.  The SARS-CoV-2 RNA is generally detectable in upper respiratory specimens during the acute phase of infection. The lowest concentration of SARS-CoV-2 viral copies this assay can detect is 138 copies/mL. A negative result does not preclude SARS-Cov-2 infection and should not be used as the sole basis for treatment or other patient management decisions. A negative result may occur with  improper specimen collection/handling, submission of specimen other than nasopharyngeal swab, presence of viral mutation(s) within the areas targeted by this assay, and inadequate number of viral copies(<138 copies/mL). A negative result must be combined with clinical observations, patient history, and epidemiological information. The expected result is  Negative.  Fact Sheet for Patients:  EntrepreneurPulse.com.au  Fact Sheet for Healthcare Providers:  IncredibleEmployment.be  This test is no t yet approved or cleared by the [place] FDA and  has been authorized for detection and/or diagnosis of SARS-CoV-2 by FDA under an Emergency Use Authorization (EUA). This EUA will remain  in effect (meaning this test can be used) for the duration of the COVID-19 declaration under Section 564(b)(1) of the Act, 21 U.S.C.section 360bbb-3(b)(1), unless the authorization is terminated  or revoked sooner.       Influenza A by PCR NEGATIVE NEGATIVE Final   Influenza B by PCR NEGATIVE NEGATIVE Final    Comment: (NOTE) The Xpert Xpress SARS-CoV-2/FLU/RSV plus assay is intended as an aid in the diagnosis of influenza from Nasopharyngeal swab specimens and should not be used as a sole basis for treatment. Nasal washings and aspirates are unacceptable for Xpert Xpress SARS-CoV-2/FLU/RSV testing.  Fact Sheet for Patients: EntrepreneurPulse.com.au  Fact Sheet for Healthcare Providers: IncredibleEmployment.be  This test is not yet approved or cleared by the [place] FDA and has been authorized for detection and/or diagnosis of SARS-CoV-2 by FDA under an Emergency Use Authorization (EUA). This EUA will remain in effect (meaning this test can be used) for the duration of the COVID-19 declaration under Section 564(b)(1) of the Act, 21 U.S.C. section 360bbb-3(b)(1), unless the authorization is terminated or revoked.  Performed at [facility], [place] [address], [place], [place] [address]   Respiratory (~20 pathogens) panel by PCR     Status: None   Collection Time: 09/30/21  4:01 PM   Specimen: Nasopharyngeal Swab; Respiratory  Result Value Ref Range Status   Adenovirus NOT DETECTED NOT DETECTED Final   Coronavirus 229E NOT DETECTED NOT DETECTED Final    Comment:  (NOTE) The Coronavirus on the Respiratory Panel, DOES NOT test for the  novel  Coronavirus (2019 nCoV)    Coronavirus HKU1 NOT DETECTED NOT DETECTED Final   Coronavirus NL63 NOT DETECTED NOT DETECTED Final   Coronavirus OC43 NOT DETECTED NOT DETECTED Final   Metapneumovirus NOT DETECTED NOT DETECTED Final   Rhinovirus / Enterovirus NOT DETECTED NOT DETECTED Final   Influenza A NOT DETECTED NOT DETECTED Final   Influenza B NOT DETECTED NOT DETECTED Final   Parainfluenza Virus 1 NOT DETECTED NOT DETECTED Final   Parainfluenza Virus 2 NOT DETECTED NOT DETECTED Final   Parainfluenza Virus 3 NOT DETECTED NOT DETECTED Final   Parainfluenza Virus 4 NOT DETECTED NOT DETECTED Final   Respiratory Syncytial Virus NOT DETECTED NOT DETECTED Final   Bordetella pertussis NOT DETECTED NOT DETECTED Final   Bordetella Parapertussis NOT DETECTED NOT DETECTED Final   Chlamydophila pneumoniae NOT DETECTED NOT DETECTED Final   Mycoplasma pneumoniae NOT DETECTED NOT DETECTED Final    Comment: Performed at [facility], [place] [address], [place],  [address]    [facility] If 7PM-7AM, please contact night-coverage at [email or web address], Office  [phone]   10/03/2021, 2:05 PM  LOS: 4 days

## 2021-10-03 NOTE — Care Management Important Message (Signed)
Important Message  Patient Details  Name: Evan Chambers MRN: [id number] Date of Birth: [birth year]   Medicare Important Message Given:  Yes     Hannah Beat 10/03/2021, 1:38 PM

## 2021-10-03 NOTE — Progress Notes (Signed)
Bilateral lower extremity venous duplex completed. Refer to "CV Proc" under chart review to view preliminary results.  10/03/2021 1:26 PM Kelby Aline., MHA, RVT, RDCS, RDMS

## 2021-10-03 NOTE — Progress Notes (Signed)
Attempted lower extremity venous duplex, however patient was in the chair. Will attempt again as schedule permits.  10/03/2021 11:41 AM Kelby Aline., MHA, RVT, RDCS, RDMS

## 2021-10-03 NOTE — TOC Initial Note (Addendum)
Transition of Care [facility]) - Initial/Assessment Note    Patient Details  Name: Evan Chambers MRN: [id number] Date of Birth: 11/26/1960  [facility]) CM/SW Contact:    [name], LCSW Phone Number: 10/03/2021, 3:52 PM  Clinical Narrative:   CSW met with pt regarding recommendation for SNF.  Pt reportedly confused, able to participate in conversation with CSW, states he lives alone, agreeable to SNF, choice document given.  Permission given to speak with [name] and mother Rolan Lipa.  Pt states his goal is to "go back to work."  CSW spoke with [name] by phone.  She reports that pt was admitted to [facility] in September of this year, transferred to [facility] and has been there ever since.  Pt was brought from [facility] to [facility] on Monday.  Pt has lived with daughter prior to this.  Daughter reports that pt does have medicaid coverage.  Discussed SNF recommendation but will need to determine if any SNF days remain since pt has been at [facility] multiple months.  Daughter willing to have pt return home with her but needs assistance during the time when she works.  CSW will investigate SNF days and medicaid.  CSW LM with [name] from [facility] regarding SNF days used.  Per epic, pt discharged to [facility] on 07/17/21.   CSW spoke with [facility] admissions who looked pt up in medicaid system and pt does not have active medicaid.  CSW spoke with [name] again, she believes she has letters from [place] regarding medicaid coverage and will find them to see if coverage has lapsed.                  Expected Discharge Plan: Skilled Nursing Facility Barriers to Discharge: Continued Medical Work up, SNF Pending bed offer   Patient Goals and CMS Choice Patient states their goals for this hospitalization and ongoing recovery are:: "get back to work" [employer] Compare Post Acute Care list provided to:: Patient Choice  offered to / list presented to : Patient  Expected Discharge Plan and Services Expected Discharge Plan: [place] In-house Referral: Clinical Social Work   Post Acute Care Choice: [place] Living arrangements for the past 2 months: [place]                                      Prior Living Arrangements/Services Living arrangements for the past 2 months: [place] Lives with:: Adult Children (daughter) Patient language and need for interpreter reviewed:: Yes Do you feel safe going back to the place where you live?: Yes      Need for Family Participation in Patient Care: Yes (Comment) Care giver support system in place?: Yes (comment) Current home services: Other (comment) (na) Criminal Activity/Legal Involvement Pertinent to Current Situation/Hospitalization: No - Comment as needed  Activities of Daily Living Home Assistive Devices/Equipment: Eyeglasses, Prosthesis, Cane (specify quad or straight) ADL Screening (condition at time of admission) Patient's cognitive ability adequate to safely complete daily activities?: Yes Is the patient deaf or have difficulty hearing?: No Does the patient have difficulty seeing, even when wearing glasses/contacts?: Yes Does the patient have difficulty concentrating, remembering, or making decisions?: No Patient able to express need for assistance with ADLs?: Yes Does the patient have difficulty dressing or bathing?: Yes Independently performs ADLs?: No Communication: Independent Does the patient have  difficulty walking or climbing stairs?: Yes Weakness of Legs: None Weakness of Arms/Hands: None  Permission Sought/Granted Permission sought to share information with : Family Supports Permission granted to share information with : Yes, Verbal Permission Granted  Share Information with NAME: [name], mother Rolan Lipa  Permission granted to share info w AGENCY: SNF         Emotional Assessment Appearance:: Appears stated age Attitude/Demeanor/Rapport: Engaged Affect (typically observed): Appropriate Orientation: : Oriented to Self Alcohol / Substance Use: Not Applicable Psych Involvement: No (comment)  Admission diagnosis:  Sepsis ([place]) [A41.9] Septic shock ([place]) [A41.9, R65.21] Patient Active Problem List   Diagnosis Date Noted   Other pancytopenia ([place])    Septic shock ([place]) 09/30/2021   Sepsis ([place]) 09/29/2021   Malnutrition of moderate degree 07/08/2021   Encephalopathy acute 06/24/2021   Type 2 diabetes mellitus ([place]) 06/23/2021   Hypertension associated with diabetes ([place]) 06/23/2021   Hyperkalemia 37/35/7897   Acute metabolic encephalopathy 84/78/4128   Acute renal failure (ARF) ([place]) 20/81/3887   Metabolic acidosis, increased anion gap 03/16/2019   Dehydration 07/17/2017   Dysphagia 07/17/2017   Hyponatremia 07/17/2017   Hiatal hernia    Lower esophageal ring    Chest pain 11/14/2016   Elevated ETOH level 11/14/2016   Hypokalemia 11/14/2016   Elevated blood sugar level 11/14/2016   Personal history of prostate cancer 11/14/2016   PCP:  Lucianne Lei, MD Pharmacy:   [facility] [place], [place] - [phone] [address] [place] [address] Phone: [phone] Fax: [phone]     Social Determinants of Health (SDOH) Interventions    Readmission Risk Interventions No flowsheet data found.

## 2021-10-04 ENCOUNTER — Inpatient Hospital Stay (HOSPITAL_COMMUNITY): Payer: Medicare HMO

## 2021-10-04 DIAGNOSIS — D61818 Other pancytopenia: Secondary | ICD-10-CM | POA: Diagnosis not present

## 2021-10-04 DIAGNOSIS — R4182 Altered mental status, unspecified: Secondary | ICD-10-CM

## 2021-10-04 DIAGNOSIS — R6521 Severe sepsis with septic shock: Secondary | ICD-10-CM | POA: Diagnosis not present

## 2021-10-04 DIAGNOSIS — A419 Sepsis, unspecified organism: Secondary | ICD-10-CM | POA: Diagnosis not present

## 2021-10-04 LAB — CBC
HCT: 21.6 % — ABNORMAL LOW (ref 39.0–52.0)
Hemoglobin: 7.6 g/dL — ABNORMAL LOW (ref 13.0–17.0)
MCH: 28.6 pg (ref 26.0–34.0)
MCHC: 35.2 g/dL (ref 30.0–36.0)
MCV: 81.2 fL (ref 80.0–100.0)
Platelets: 97 10*3/uL — ABNORMAL LOW (ref 150–400)
RBC: 2.66 MIL/uL — ABNORMAL LOW (ref 4.22–5.81)
RDW: 18.6 % — ABNORMAL HIGH (ref 11.5–15.5)
WBC: 5.9 10*3/uL (ref 4.0–10.5)
nRBC: 0.3 % — ABNORMAL HIGH (ref 0.0–0.2)

## 2021-10-04 LAB — BASIC METABOLIC PANEL
Anion gap: 7 (ref 5–15)
BUN: <5 mg/dL — ABNORMAL LOW (ref 6–20)
CO2: 24 mmol/L (ref 22–32)
Calcium: 8 mg/dL — ABNORMAL LOW (ref 8.9–10.3)
Chloride: 107 mmol/L (ref 98–111)
Creatinine, Ser: 0.59 mg/dL — ABNORMAL LOW (ref 0.61–1.24)
GFR, Estimated: >60 mL/min (ref >60)
Glucose, Bld: 88 mg/dL (ref 70–99)
Potassium: 3 mmol/L — ABNORMAL LOW (ref 3.5–5.1)
Sodium: 138 mmol/L (ref 135–145)

## 2021-10-04 LAB — CULTURE, BLOOD (ROUTINE X 2): Culture: NO GROWTH

## 2021-10-04 LAB — GLUCOSE, CAPILLARY
Glucose-Capillary: 110 mg/dL — ABNORMAL HIGH (ref 70–99)
Glucose-Capillary: 117 mg/dL — ABNORMAL HIGH (ref 70–99)
Glucose-Capillary: 118 mg/dL — ABNORMAL HIGH (ref 70–99)
Glucose-Capillary: 123 mg/dL — ABNORMAL HIGH (ref 70–99)
Glucose-Capillary: 77 mg/dL (ref 70–99)
Glucose-Capillary: 88 mg/dL (ref 70–99)
Glucose-Capillary: 88 mg/dL (ref 70–99)

## 2021-10-04 LAB — PROCALCITONIN: Procalcitonin: 1.62 ng/mL

## 2021-10-04 LAB — PROTIME-INR
INR: 1 (ref 0.8–1.2)
Prothrombin Time: 13.4 seconds (ref 11.4–15.2)

## 2021-10-04 MED ORDER — POTASSIUM CHLORIDE CRYS ER 20 MEQ PO TBCR
40.0000 meq | EXTENDED_RELEASE_TABLET | ORAL | Status: AC
  Administered 2021-10-04 (×2): 40 meq via ORAL
  Filled 2021-10-04 (×2): qty 2

## 2021-10-04 MED ORDER — MAGNESIUM SULFATE IN D5W 1-5 GM/100ML-% IV SOLN
1.0000 g | Freq: Once | INTRAVENOUS | Status: AC
  Administered 2021-10-04: 1 g via INTRAVENOUS
  Filled 2021-10-04: qty 100

## 2021-10-04 NOTE — Procedures (Signed)
Patient Name: Evan Chambers  MRN: [id number]  Epilepsy Attending: Lora Havens  Referring Physician/Provider: Dr Kerney Elbe Date: 10/04/2021 Duration: 20.54 mins  Patient history: 60yo m with ams. EEG to evaluate for seizure  Level of alertness: Awake  AEDs during EEG study: None  Technical aspects: This EEG study was done with scalp electrodes positioned according to the 10-20 International system of electrode placement. Electrical activity was acquired at a sampling rate of 500Hz  and reviewed with a high frequency filter of 70Hz  and a low frequency filter of 1Hz . EEG data were recorded continuously and digitally stored.   Description: The posterior dominant rhythm consists of 8 Hz activity of moderate voltage (25-35 uV) seen predominantly in posterior head regions, symmetric and reactive to eye opening and eye closing. Hyperventilation and photic stimulation were not performed.     IMPRESSION: This study is within normal limits. No seizures or epileptiform discharges were seen throughout the recording.  [name]

## 2021-10-04 NOTE — Plan of Care (Addendum)
Neurology plan of care  EEG was negative MRI was negative  Continue thiamine, folate, B12 supplementation  Please call for questions.   Electronically signed by:  Lynnae Sandhoff, MD Page: [phone] 10/04/2021, 8:38 PM

## 2021-10-04 NOTE — Plan of Care (Signed)
°  Problem: Education: Goal: Knowledge of General Education information will improve Description: Including pain rating scale, medication(s)/side effects and non-pharmacologic comfort measures Outcome: Progressing   Problem: Health Behavior/Discharge Planning: Goal: Ability to manage health-related needs will improve Outcome: Progressing   Problem: Clinical Measurements: Goal: Ability to maintain clinical measurements within normal limits will improve Outcome: Progressing   Problem: Activity: Goal: Risk for activity intolerance will decrease Outcome: Progressing   Problem: Coping: Goal: Level of anxiety will decrease Outcome: Progressing   Problem: Elimination: Goal: Will not experience complications related to bowel motility Outcome: Progressing Goal: Will not experience complications related to urinary retention Outcome: Progressing

## 2021-10-04 NOTE — Progress Notes (Addendum)
[facility]  PROGRESS NOTE  Evan Chambers DXI:[id number] DOB: [birth year] DOA: 09/29/2021 PCP: Lucianne Lei, MD   Brief HPI:    60 yr old male with past medical history of diabetes mellitus type 2, s/p left AKA, prior aspiration pneumonia, alcohol use disorder, Warnicke's encephalopathy, empyema s/p chest tube and VATS in September 2022 who presented to ED from skilled nursing facility on 12/26 with generalized weakness.  Patient was found to have low BP reading, transient lightheadedness.  He was found to be hypotensive in the ED with blood pressure 79/52, leukopenia.  He was admitted to [facility] for management of severe sepsis due to pneumonia, treated with vancomycin, zosyn, steroids, midodrine and IV fluids.  Despite all these measures patient remained hypotensive so PCCM was consulted.  Patient was transferred to [facility] service.  Sepsis physiology has resolved.  He was also found to be pancytopenic, hematology/oncology were consulted.  Pancytopenia has resolved. Patient was stable for transfer to floor [facility] resumed care on 10/03/2021    Subjective   Patient seen and examined, continues to be pleasantly confused.  Appreciate neurology input.   Assessment/Plan:    Metabolic encephalopathy -Patient still confused, he has history of Warnicke's encephalopathy; likely worsened in setting of sepsis/ICU delirium -Has history of ongoing alcohol abuse; no signs and symptoms of alcohol withdrawal -MRI brain from September 2022 showed Wernicke's encephalopathy -Continue thiamine, folate, B12 supplementation -Neurology consultation obtained, MRI brain, EEG ordered -Appreciate neurology input  Pancytopenia -Likely in the setting of sepsis/alcohol abuse -Resolved -Hematology/oncology has signed off  Anemia -Hemoglobin 7.6 this morning -Likely in setting of pancytopenia as above -He has iron-deficiency anemia, hematology does not recommend IV iron at this time due to acute illness -Check  FOBT -If continues to have anemia in 2 to 3 weeks, may need GI evaluation as outpatient  Septic shock/lactic acidosis -Resolved -Likely from pneumonia -Improved with antibiotics -Blood pressure has normalized  Non-anion gap metabolic acidosis -Resolved -We will discontinue Plasma-Lyte  Hypokalemia -Potassium is still 3.0 -We will replace both magnesium(1.7) and potassium  Acute liver injury -Resolved -Likely in setting of hypotension/septic shock -Abdominal ultrasound unremarkable -Liver enzymes have improved  History of hypertension -Metoprolol has been resumed  Medications     sodium chloride   Intravenous Once   Chlorhexidine Gluconate Cloth  6 each Topical Daily   folic acid  1 mg Oral Daily   heparin injection (subcutaneous)  5,000 Units Subcutaneous Q8H   metoprolol tartrate  25 mg Oral BID   multivitamin with minerals  1 tablet Oral Daily   pantoprazole  40 mg Oral Daily   sodium chloride flush  10-40 mL Intracatheter Q12H   sodium chloride flush  3 mL Intravenous Q12H   thiamine  100 mg Oral Daily   Or   thiamine  100 mg Intravenous Daily   vitamin B-12  1,000 mcg Oral Daily     Data Reviewed:   CBG:  Recent Labs  Lab 10/03/21 2259 10/04/21 0334 10/04/21 0404 10/04/21 0818 10/04/21 1205  GLUCAP 95 77 88 88 117*    SpO2: 98 % O2 Flow Rate (L/min): 2 L/min    Vitals:   10/03/21 1654 10/03/21 2000 10/04/21 0409 10/04/21 0819  BP: (!) 141/99 132/86  138/87  Pulse: (!) 110  82 89  Resp:  (!) 24 16 14   Temp: 99.1 F (37.3 C) 99.3 F (37.4 C) 98.4 F (36.9 C) 98.2 F (36.8 C)  TempSrc: Oral Oral Oral Oral  SpO2: 98%  97%  98%  Weight:         Intake/Output Summary (Last 24 hours) at 10/04/2021 1306 Last data filed at 10/04/2021 1200 Gross per 24 hour  Intake 528.01 ml  Output 1300 ml  Net -771.99 ml    12/29 1901 - 12/31 0700 In: 198 [I.V.:30] Out: 1150 [Urine:1150]  Filed Weights   09/30/21 1500  Weight: 54.5 kg    Data  Reviewed: Basic Metabolic Panel: Recent Labs  Lab 09/29/21 1442 09/29/21 1703 09/30/21 0521 10/01/21 0500 10/02/21 0437 10/03/21 0912 10/04/21 0156  NA 136   < > 138 135 136 139 138  K 4.7   < > 4.3 4.4 3.3* 3.1* 3.0*  CL 106  --  111 110 110 111 107  CO2 16*  --  12* 14* 21* 24 24  GLUCOSE 133*  --  64* 119* 106* 90 88  BUN <5*  --  <5* 11 8 <5* <5*  CREATININE 1.01   < > 1.10 0.97 0.71 0.58* 0.59*  CALCIUM 8.9  --  7.8* 7.8* 7.7* 7.9* 8.0*  MG 1.3*  --  1.5* 2.1 1.9 1.7  --    < > = values in this interval not displayed.   Liver Function Tests: Recent Labs  Lab 09/29/21 1442 09/30/21 0521 10/01/21 0500 10/02/21 0437 10/03/21 0912  AST 18 14* 14* 8* 10*  ALT 9 7 10 5 9   ALKPHOS 56 35* 40 67 114  BILITOT 1.3* 1.4* 1.3* 1.2 2.0*  PROT 6.5 4.4* 4.5* 4.4* 4.6*  ALBUMIN 3.1* 2.0* 1.9* 1.8* 1.8*   No results for input(s): LIPASE, AMYLASE in the last 168 hours. No results for input(s): AMMONIA in the last 168 hours. CBC: Recent Labs  Lab 09/29/21 1442 09/29/21 1703 09/30/21 0521 09/30/21 1539 09/30/21 2124 10/01/21 0500 10/02/21 0437 10/03/21 0912 10/04/21 0156  WBC 2.8*   < > 4.1  --   --  14.5* 11.8* 8.9 5.9  NEUTROABS 1.5*  --  3.3  --   --  12.2* 9.8* 6.3  --   HGB 9.4*   < > 6.9*  --  9.4* 8.7* 7.0* 7.5* 7.6*  HCT 30.2*   < > 21.0*  --  29.0* 25.7* 19.9* 22.0* 21.6*  MCV 87.8   < > 86.4  --   --  83.2 81.9 82.1 81.2  PLT 118*   < > 78* 89*  --  92* 85* 88* 97*   < > = values in this interval not displayed.   Cardiac Enzymes: No results for input(s): CKTOTAL, CKMB, CKMBINDEX, TROPONINI in the last 168 hours. BNP (last 3 results) Recent Labs    10/01/21 0500 10/02/21 0437 10/03/21 0912  BNP 237.2* 77.8 550.1*    ProBNP (last 3 results) No results for input(s): PROBNP in the last 8760 hours.  CBG: Recent Labs  Lab 10/03/21 2259 10/04/21 0334 10/04/21 0404 10/04/21 0818 10/04/21 1205  GLUCAP 95 77 88 88 117*       Radiology  Reports  MR BRAIN WO CONTRAST  Result Date: 10/04/2021 CLINICAL DATA:  Persistent or worsening mental status. History of Wernicke's EXAM: MRI HEAD WITHOUT CONTRAST TECHNIQUE: Multiplanar, multiecho pulse sequences of the brain and surrounding structures were obtained without intravenous contrast. COMPARISON:  Head CT from 16 days ago FINDINGS: Brain: No acute infarction, hemorrhage, hydrocephalus, extra-axial collection or mass lesion. Chronic small vessel ischemia in the hemispheric white matter to a moderate degree. Resolved signal abnormality in the thalami and brainstem. No acute infarct,  hemorrhage, hydrocephalus, or collection. Cerebral volume loss. Vascular: Preserved flow voids Skull and upper cervical spine: Normal marrow signal Sinuses/Orbits: Left mastoid opacification with negative nasopharynx. IMPRESSION: No acute or reversible finding. No recurrence of Wernicke's encephalopathy findings. Electronically Signed   By: Jorje Guild M.D.   On: 10/04/2021 12:02   VAS [place] LOWER EXTREMITY VENOUS (DVT)  Result Date: 10/03/2021  Lower Venous DVT Study Patient Name:  Evan Chambers  Date of Exam:   10/03/2021 Medical Rec #: [id number]        Accession #:    [id number] Date of Birth: 1961-01-24        Patient Gender: M Patient Age:   35 years Exam Location:  [facility] Procedure:      VAS [place] LOWER EXTREMITY VENOUS (DVT) Referring Phys: Noemi Chapel --------------------------------------------------------------------------------  Indications: Edema.  Risk Factors: Left AKA. Comparison Study: No prior study Performing Technologist: Maudry Mayhew MHA, RDMS, RVT, RDCS  Examination Guidelines: A complete evaluation includes B-mode imaging, spectral Doppler, color Doppler, and power Doppler as needed of all accessible portions of each vessel. Bilateral testing is considered an integral part of a complete examination. Limited examinations for reoccurring indications may be performed as noted. The  reflux portion of the exam is performed with the patient in reverse Trendelenburg.  +---------+---------------+---------+-----------+----------+--------------+  RIGHT     Compressibility Phasicity Spontaneity Properties Thrombus Aging  +---------+---------------+---------+-----------+----------+--------------+  CFV       Full            Yes       Yes                                    +---------+---------------+---------+-----------+----------+--------------+  FV Prox   Full                                                             +---------+---------------+---------+-----------+----------+--------------+  FV Mid    Full                                                             +---------+---------------+---------+-----------+----------+--------------+  FV Distal Full                                                             +---------+---------------+---------+-----------+----------+--------------+  PFV       Full                                                             +---------+---------------+---------+-----------+----------+--------------+  POP       Full            Yes  Yes                                    +---------+---------------+---------+-----------+----------+--------------+  PTV       Full                                                             +---------+---------------+---------+-----------+----------+--------------+  PERO      Full                                                             +---------+---------------+---------+-----------+----------+--------------+   +---------+---------------+---------+-----------+----------+--------------+  LEFT      Compressibility Phasicity Spontaneity Properties Thrombus Aging  +---------+---------------+---------+-----------+----------+--------------+  CFV       Full            Yes       Yes                                    +---------+---------------+---------+-----------+----------+--------------+  FV Prox   Full                                                              +---------+---------------+---------+-----------+----------+--------------+  FV Mid    Full                                                             +---------+---------------+---------+-----------+----------+--------------+  FV Distal Full                                                             +---------+---------------+---------+-----------+----------+--------------+  PFV       Full                                                             +---------+---------------+---------+-----------+----------+--------------+ Left AKA    Summary: RIGHT: - There is no evidence of deep vein thrombosis in the lower extremity.  - No cystic structure found in the popliteal fossa.  LEFT: - There is no evidence of deep vein thrombosis in the lower extremity. However, portions of this examination were limited- see technologist comments above.  *See table(s) above for measurements and observations. Electronically signed by Monica Martinez MD on  10/03/2021 at 4:23:39 PM.    Final        Antibiotics: Anti-infectives (From admission, onward)    Start     Dose/Rate Route Frequency Ordered Stop   09/30/21 0215  piperacillin-tazobactam (ZOSYN) IVPB 3.375 g       See Hyperspace for full Linked Orders Report.   3.375 g 12.5 mL/hr over 240 Minutes Intravenous Every 8 hours 09/29/21 1803 10/05/21 2359   09/29/21 1930  vancomycin (VANCOREADY) IVPB 1500 mg/300 mL  Status:  Discontinued        1,500 mg 150 mL/hr over 120 Minutes Intravenous Every 24 hours 09/29/21 1930 10/01/21 1053   09/29/21 1815  piperacillin-tazobactam (ZOSYN) IVPB 3.375 g       See Hyperspace for full Linked Orders Report.   3.375 g 100 mL/hr over 30 Minutes Intravenous  Once 09/29/21 1803 09/29/21 2019   09/29/21 1815  vancomycin (VANCOREADY) IVPB 1500 mg/300 mL  Status:  Discontinued        1,500 mg 150 mL/hr over 120 Minutes Intravenous  Once 09/29/21 1803 09/29/21 1930   09/29/21 1600  cefTRIAXone  (ROCEPHIN) 2 g in sodium chloride 0.9 % 100 mL IVPB  Status:  Discontinued        2 g 200 mL/hr over 30 Minutes Intravenous Every 24 hours 09/29/21 1557 09/29/21 1702   09/29/21 1600  azithromycin (ZITHROMAX) 500 mg in sodium chloride 0.9 % 250 mL IVPB  Status:  Discontinued        500 mg 250 mL/hr over 60 Minutes Intravenous Every 24 hours 09/29/21 1557 09/29/21 1702         DVT prophylaxis:   Code Status: Full code  Family Communication: No family at bedside   Consultants: PCCM Hematology/oncology  Procedures:     Objective    Physical Examination:   General-appears in no acute distress Heart-S1-S2, regular, no murmur auscultated Lungs-clear to auscultation bilaterally, no wheezing or crackles auscultated Abdomen-soft, nontender, no organomegaly Extremities-no edema in the lower extremities Neuro-alert, oriented x3, no focal deficit noted  Status is: Inpatient  Dispo: The patient is from: Skilled nursing facility              Anticipated d/c is to: Skilled nursing facility              Anticipated d/c date is: 10-06-21              Patient currently not stable for discharge  Barrier to discharge-ongoing evaluation for encephalopathy  COVID-19 Labs  Recent Labs    10/02/21 0437 10/02/21 1426 10/03/21 0912  DDIMER  --  1.01*  --   CRP 13.8*  --  8.3*    Lab Results  Component Value Date   SARSCOV2NAA NEGATIVE 09/30/2021   [place] NEGATIVE 09/18/2021   [place] NEGATIVE 07/15/2021   [place] NEGATIVE 06/23/2021            Recent Results (from the past 240 hour(s))  MRSA Next Gen by PCR, Nasal     Status: None   Collection Time: 09/29/21  5:04 PM   Specimen: Nasal Mucosa; Nasal Swab  Result Value Ref Range Status   MRSA by PCR Next Gen NOT DETECTED NOT DETECTED Final    Comment: (NOTE) The GeneXpert MRSA Assay (FDA approved for NASAL specimens only), is one component of a comprehensive MRSA colonization surveillance program.  It is not intended to diagnose MRSA infection nor to guide or monitor treatment for MRSA infections. Test performance is not FDA approved  in patients less than 51 years old. Performed at [facility], [place] [address], [place], [place] [address]   Blood culture (routine x 2)     Status: None   Collection Time: 09/29/21  7:20 PM   Specimen: BLOOD  Result Value Ref Range Status   Specimen Description BLOOD LEFT ANTECUBITAL  Final   Special Requests   Final    BOTTLES DRAWN AEROBIC AND ANAEROBIC Blood Culture results may not be optimal due to an inadequate volume of blood received in culture bottles   Culture   Final    NO GROWTH 5 DAYS Performed at [facility], [place] [address] [place], [place] [address]    Report Status 10/04/2021 FINAL  Final  Resp Panel by RT-PCR (Flu A&B, Covid) Nasopharyngeal Swab     Status: None   Collection Time: 09/30/21  4:01 PM   Specimen: Nasopharyngeal Swab; Nasopharyngeal(NP) swabs in vial transport medium  Result Value Ref Range Status   SARS Coronavirus 2 by RT PCR NEGATIVE NEGATIVE Final    Comment: (NOTE) SARS-CoV-2 target nucleic acids are NOT DETECTED.  The SARS-CoV-2 RNA is generally detectable in upper respiratory specimens during the acute phase of infection. The lowest concentration of SARS-CoV-2 viral copies this assay can detect is 138 copies/mL. A negative result does not preclude SARS-Cov-2 infection and should not be used as the sole basis for treatment or other patient management decisions. A negative result may occur with  improper specimen collection/handling, submission of specimen other than nasopharyngeal swab, presence of viral mutation(s) within the areas targeted by this assay, and inadequate number of viral copies(<138 copies/mL). A negative result must be combined with clinical observations, patient history, and epidemiological information. The expected result is Negative.  Fact Sheet for Patients:   EntrepreneurPulse.com.au  Fact Sheet for Healthcare Providers:  IncredibleEmployment.be  This test is no t yet approved or cleared by the [place] FDA and  has been authorized for detection and/or diagnosis of SARS-CoV-2 by FDA under an Emergency Use Authorization (EUA). This EUA will remain  in effect (meaning this test can be used) for the duration of the COVID-19 declaration under Section 564(b)(1) of the Act, 21 U.S.C.section 360bbb-3(b)(1), unless the authorization is terminated  or revoked sooner.       Influenza A by PCR NEGATIVE NEGATIVE Final   Influenza B by PCR NEGATIVE NEGATIVE Final    Comment: (NOTE) The Xpert Xpress SARS-CoV-2/FLU/RSV plus assay is intended as an aid in the diagnosis of influenza from Nasopharyngeal swab specimens and should not be used as a sole basis for treatment. Nasal washings and aspirates are unacceptable for Xpert Xpress SARS-CoV-2/FLU/RSV testing.  Fact Sheet for Patients: EntrepreneurPulse.com.au  Fact Sheet for Healthcare Providers: IncredibleEmployment.be  This test is not yet approved or cleared by the [place] FDA and has been authorized for detection and/or diagnosis of SARS-CoV-2 by FDA under an Emergency Use Authorization (EUA). This EUA will remain in effect (meaning this test can be used) for the duration of the COVID-19 declaration under Section 564(b)(1) of the Act, 21 U.S.C. section 360bbb-3(b)(1), unless the authorization is terminated or revoked.  Performed at [facility], [place] [address] [place], [place] [address]   Respiratory (~20 pathogens) panel by PCR     Status: None   Collection Time: 09/30/21  4:01 PM   Specimen: Nasopharyngeal Swab; Respiratory  Result Value Ref Range Status   Adenovirus NOT DETECTED NOT DETECTED Final   Coronavirus 229E NOT  DETECTED NOT DETECTED Final    Comment: (NOTE) The Coronavirus on the  Respiratory Panel, DOES NOT test for the novel  Coronavirus (2019 nCoV)    Coronavirus HKU1 NOT DETECTED NOT DETECTED Final   Coronavirus NL63 NOT DETECTED NOT DETECTED Final   Coronavirus OC43 NOT DETECTED NOT DETECTED Final   Metapneumovirus NOT DETECTED NOT DETECTED Final   Rhinovirus / Enterovirus NOT DETECTED NOT DETECTED Final   Influenza A NOT DETECTED NOT DETECTED Final   Influenza B NOT DETECTED NOT DETECTED Final   Parainfluenza Virus 1 NOT DETECTED NOT DETECTED Final   Parainfluenza Virus 2 NOT DETECTED NOT DETECTED Final   Parainfluenza Virus 3 NOT DETECTED NOT DETECTED Final   Parainfluenza Virus 4 NOT DETECTED NOT DETECTED Final   Respiratory Syncytial Virus NOT DETECTED NOT DETECTED Final   Bordetella pertussis NOT DETECTED NOT DETECTED Final   Bordetella Parapertussis NOT DETECTED NOT DETECTED Final   Chlamydophila pneumoniae NOT DETECTED NOT DETECTED Final   Mycoplasma pneumoniae NOT DETECTED NOT DETECTED Final    Comment: Performed at [facility], [place] [address], [place], [place] [address]    [facility] If 7PM-7AM, please contact night-coverage at [email or web address], Office  [phone]   10/04/2021, 1:06 PM  LOS: 5 days

## 2021-10-04 NOTE — Progress Notes (Signed)
EEG done at bedside. No skin breakdown noted. Results pending. 
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
## 2021-10-04 NOTE — Progress Notes (Signed)
Patient having some mild confusion and incontinence of bowel and bladder tonight. Patient was covered in stool, but had not notified staff. When asked why he had not called for assistance he stated, that he thought we knew.

## 2021-10-05 DIAGNOSIS — R6520 Severe sepsis without septic shock: Secondary | ICD-10-CM | POA: Diagnosis not present

## 2021-10-05 DIAGNOSIS — G6281 Critical illness polyneuropathy: Secondary | ICD-10-CM | POA: Diagnosis not present

## 2021-10-05 DIAGNOSIS — A021 Salmonella sepsis: Secondary | ICD-10-CM | POA: Diagnosis not present

## 2021-10-05 LAB — COMPREHENSIVE METABOLIC PANEL
ALT: 12 U/L (ref 0–44)
AST: 16 U/L (ref 15–41)
Albumin: 2 g/dL — ABNORMAL LOW (ref 3.5–5.0)
Alkaline Phosphatase: 109 U/L (ref 38–126)
Anion gap: 6 (ref 5–15)
BUN: <5 mg/dL — ABNORMAL LOW (ref 6–20)
CO2: 24 mmol/L (ref 22–32)
Calcium: 8.3 mg/dL — ABNORMAL LOW (ref 8.9–10.3)
Chloride: 108 mmol/L (ref 98–111)
Creatinine, Ser: 0.65 mg/dL (ref 0.61–1.24)
GFR, Estimated: >60 mL/min (ref >60)
Glucose, Bld: 92 mg/dL (ref 70–99)
Potassium: 4 mmol/L (ref 3.5–5.1)
Sodium: 138 mmol/L (ref 135–145)
Total Bilirubin: 1.1 mg/dL (ref 0.3–1.2)
Total Protein: 4.9 g/dL — ABNORMAL LOW (ref 6.5–8.1)

## 2021-10-05 LAB — CBC
HCT: 23.9 % — ABNORMAL LOW (ref 39.0–52.0)
Hemoglobin: 8.3 g/dL — ABNORMAL LOW (ref 13.0–17.0)
MCH: 28.2 pg (ref 26.0–34.0)
MCHC: 34.7 g/dL (ref 30.0–36.0)
MCV: 81.3 fL (ref 80.0–100.0)
Platelets: 122 10*3/uL — ABNORMAL LOW (ref 150–400)
RBC: 2.94 MIL/uL — ABNORMAL LOW (ref 4.22–5.81)
RDW: 18.5 % — ABNORMAL HIGH (ref 11.5–15.5)
WBC: 6.1 10*3/uL (ref 4.0–10.5)
nRBC: 0.5 % — ABNORMAL HIGH (ref 0.0–0.2)

## 2021-10-05 LAB — GLUCOSE, CAPILLARY
Glucose-Capillary: 101 mg/dL — ABNORMAL HIGH (ref 70–99)
Glucose-Capillary: 112 mg/dL — ABNORMAL HIGH (ref 70–99)
Glucose-Capillary: 118 mg/dL — ABNORMAL HIGH (ref 70–99)
Glucose-Capillary: 89 mg/dL (ref 70–99)
Glucose-Capillary: 93 mg/dL (ref 70–99)
Glucose-Capillary: 99 mg/dL (ref 70–99)

## 2021-10-05 MED ORDER — AMOXICILLIN-POT CLAVULANATE 875-125 MG PO TABS
1.0000 | ORAL_TABLET | Freq: Two times a day (BID) | ORAL | Status: AC
  Administered 2021-10-05 – 2021-10-06 (×4): 1 via ORAL
  Filled 2021-10-05 (×4): qty 1

## 2021-10-05 MED ORDER — TAMSULOSIN HCL 0.4 MG PO CAPS
0.4000 mg | ORAL_CAPSULE | Freq: Every day | ORAL | Status: DC
  Administered 2021-10-05 – 2021-10-07 (×3): 0.4 mg via ORAL
  Filled 2021-10-05 (×3): qty 1

## 2021-10-05 NOTE — Progress Notes (Signed)
Mobility Specialist Progress Note:   10/05/21 1500  Mobility  Activity Ambulated in room;Transferred:  Bed to chair  Level of Assistance Minimal assist, patient does 75% or more  Assistive Device Front wheel walker  Distance Ambulated (ft) 6 ft  Mobility Sit up in bed/chair position for meals  Mobility Response Tolerated well  Mobility performed by Mobility specialist  Bed Position Chair  $Mobility charge 1 Mobility   Pt with minimal confusion this pm, agreeable to transfer to chair. Required no assist to stand, minG-minA to ambulate. Able to make good "hops" from bed to chair. Majority of time spent in pericare d/t stool incontinence. Pt left in chair eating meal with chair alarm on.  Nelta Numbers Mobility Specialist  Phone [phone]

## 2021-10-05 NOTE — Plan of Care (Signed)
°  Problem: Education: Goal: Knowledge of General Education information will improve Description: Including pain rating scale, medication(s)/side effects and non-pharmacologic comfort measures Outcome: Progressing   Problem: Clinical Measurements: Goal: Ability to maintain clinical measurements within normal limits will improve Outcome: Progressing Goal: Will remain free from infection Outcome: Progressing Goal: Respiratory complications will improve Outcome: Progressing   Problem: Nutrition: Goal: Adequate nutrition will be maintained Outcome: Progressing   Problem: Coping: Goal: Level of anxiety will decrease Outcome: Progressing
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
## 2021-10-06 DIAGNOSIS — G6281 Critical illness polyneuropathy: Secondary | ICD-10-CM | POA: Diagnosis not present

## 2021-10-06 DIAGNOSIS — R6520 Severe sepsis without septic shock: Secondary | ICD-10-CM | POA: Diagnosis not present

## 2021-10-06 DIAGNOSIS — A021 Salmonella sepsis: Secondary | ICD-10-CM | POA: Diagnosis not present

## 2021-10-06 LAB — CBC WITH DIFFERENTIAL/PLATELET
Abs Immature Granulocytes: 0.29 10*3/uL — ABNORMAL HIGH (ref 0.00–0.07)
Basophils Absolute: 0 10*3/uL (ref 0.0–0.1)
Basophils Relative: 1 %
Eosinophils Absolute: 0.2 10*3/uL (ref 0.0–0.5)
Eosinophils Relative: 4 %
HCT: 25.8 % — ABNORMAL LOW (ref 39.0–52.0)
Hemoglobin: 8.8 g/dL — ABNORMAL LOW (ref 13.0–17.0)
Immature Granulocytes: 5 %
Lymphocytes Relative: 24 %
Lymphs Abs: 1.6 10*3/uL (ref 0.7–4.0)
MCH: 28.1 pg (ref 26.0–34.0)
MCHC: 34.1 g/dL (ref 30.0–36.0)
MCV: 82.4 fL (ref 80.0–100.0)
Monocytes Absolute: 1.1 10*3/uL — ABNORMAL HIGH (ref 0.1–1.0)
Monocytes Relative: 17 %
Neutro Abs: 3.2 10*3/uL (ref 1.7–7.7)
Neutrophils Relative %: 49 %
Platelets: 179 10*3/uL (ref 150–400)
RBC: 3.13 MIL/uL — ABNORMAL LOW (ref 4.22–5.81)
RDW: 18.6 % — ABNORMAL HIGH (ref 11.5–15.5)
WBC: 6.5 10*3/uL (ref 4.0–10.5)
nRBC: 0.3 % — ABNORMAL HIGH (ref 0.0–0.2)

## 2021-10-06 LAB — BASIC METABOLIC PANEL
Anion gap: 11 (ref 5–15)
BUN: <5 mg/dL — ABNORMAL LOW (ref 6–20)
CO2: 23 mmol/L (ref 22–32)
Calcium: 9.3 mg/dL (ref 8.9–10.3)
Chloride: 104 mmol/L (ref 98–111)
Creatinine, Ser: 0.83 mg/dL (ref 0.61–1.24)
GFR, Estimated: >60 mL/min (ref >60)
Glucose, Bld: 86 mg/dL (ref 70–99)
Potassium: 4.1 mmol/L (ref 3.5–5.1)
Sodium: 138 mmol/L (ref 135–145)

## 2021-10-06 LAB — GLUCOSE, CAPILLARY
Glucose-Capillary: 82 mg/dL (ref 70–99)
Glucose-Capillary: 127 mg/dL — ABNORMAL HIGH (ref 70–99)
Glucose-Capillary: 102 mg/dL — ABNORMAL HIGH (ref 70–99)
Glucose-Capillary: 122 mg/dL — ABNORMAL HIGH (ref 70–99)
Glucose-Capillary: 96 mg/dL (ref 70–99)
Glucose-Capillary: 104 mg/dL — ABNORMAL HIGH (ref 70–99)

## 2021-10-06 MED ORDER — ENSURE ENLIVE PO LIQD
237.0000 mL | Freq: Three times a day (TID) | ORAL | Status: DC
  Administered 2021-10-06 (×2): 237 mL via ORAL

## 2021-10-06 NOTE — [place] FL2 (Signed)
[place] MEDICAID FL2 LEVEL OF CARE SCREENING TOOL     IDENTIFICATION  Patient Name: Evan Chambers Birthdate: May 31, 1961 Sex: male Admission Date (Current Location): 09/29/2021  [facility] and [place] Number:  Herbalist and Address:  [facility] [place], [place] [address] [place], [place] [address]      Provider Number: [id number]  Attending Physician Name and Address:  Shelly Coss, MD  Relative Name and Phone Number:  Herring,Christina Daughter   [phone]    Current Level of Care: Hospital Recommended Level of Care: [place] Prior Approval Number:    Date Approved/Denied:   PASRR Number: [id number]  Discharge Plan: SNF    Current Diagnoses: Patient Active Problem List   Diagnosis Date Noted   Other pancytopenia ([place])    Septic shock ([place]) 09/30/2021   Sepsis ([place]) 09/29/2021   Malnutrition of moderate degree 07/08/2021   Encephalopathy acute 06/24/2021   Type 2 diabetes mellitus ([place]) 06/23/2021   Hypertension associated with diabetes ([place]) 06/23/2021   Hyperkalemia 92/08/9416   Acute metabolic encephalopathy 40/81/4481   Acute renal failure (ARF) ([place]) 85/63/1497   Metabolic acidosis, increased anion gap 03/16/2019   Dehydration 07/17/2017   Dysphagia 07/17/2017   Hyponatremia 07/17/2017   Hiatal hernia    Lower esophageal ring    Chest pain 11/14/2016   Elevated ETOH level 11/14/2016   Hypokalemia 11/14/2016   Elevated blood sugar level 11/14/2016   Personal history of prostate cancer 11/14/2016    Orientation RESPIRATION BLADDER Height & Weight     Self, Time, Place  Normal Incontinent, External catheter Weight: 120 lb 2.4 oz (54.5 kg) Height:     BEHAVIORAL SYMPTOMS/MOOD NEUROLOGICAL BOWEL NUTRITION STATUS      Incontinent Diet (see discharge summary)  AMBULATORY STATUS COMMUNICATION OF NEEDS Skin   Total Care Verbally Normal                       Personal Care Assistance Level of  Assistance  Bathing, Feeding, Dressing Bathing Assistance: Maximum assistance Feeding assistance: Limited assistance Dressing Assistance: Maximum assistance     Functional Limitations Info  Sight, Hearing, Speech Sight Info: Adequate Hearing Info: Adequate Speech Info: Adequate    SPECIAL CARE FACTORS FREQUENCY  PT (By licensed PT), OT (By licensed OT)     PT Frequency: 5x week OT Frequency: 5x week            Contractures Contractures Info: Not present    Additional Factors Info  Code Status, Allergies Code Status Info: full Allergies Info: NKA           Current Medications (10/06/2021):  This is the current hospital active medication list Current Facility-Administered Medications  Medication Dose Route Frequency Provider Last Rate Last Admin   0.9 %  sodium chloride infusion (Manually program via Guardrails IV Fluids)   Intravenous Once Lala Lund K, MD       0.9 %  sodium chloride infusion  250 mL Intravenous PRN Cristal Generous, NP   Stopped at 09/30/21 1648   acetaminophen (TYLENOL) tablet 650 mg  650 mg Oral Q6H PRN Thurnell Lose, MD   650 mg at 10/05/21 2205   Or   acetaminophen (TYLENOL) suppository 650 mg  650 mg Rectal Q6H PRN Thurnell Lose, MD       amoxicillin-clavulanate (AUGMENTIN) 875-125 MG per tablet 1 tablet  1 tablet Oral Q12H Shelly Coss, MD   1 tablet at 10/06/21 [phone]  bisacodyl (DULCOLAX) EC tablet 5 mg  5 mg Oral Daily PRN Thurnell Lose, MD       Chlorhexidine Gluconate Cloth 2 % PADS 6 each  6 each Topical Daily Olalere, Adewale A, MD   6 each at 10/06/21 0952   feeding supplement (ENSURE ENLIVE / ENSURE PLUS) liquid 237 mL  237 mL Oral TID BM Shelly Coss, MD       folic acid (FOLVITE) tablet 1 mg  1 mg Oral Daily Thurnell Lose, MD   1 mg at 10/06/21 0951   heparin injection 5,000 Units  5,000 Units Subcutaneous Q8H Thurnell Lose, MD   5,000 Units at 10/06/21 4235   metoprolol tartrate (LOPRESSOR) tablet 25 mg  25  mg Oral BID Noemi Chapel P, DO   25 mg at 10/06/21 3614   multivitamin with minerals tablet 1 tablet  1 tablet Oral Daily Thurnell Lose, MD   1 tablet at 10/06/21 0951   ondansetron (ZOFRAN) tablet 4 mg  4 mg Oral Q6H PRN Thurnell Lose, MD       Or   ondansetron [facility]) injection 4 mg  4 mg Intravenous Q6H PRN Thurnell Lose, MD       pantoprazole (PROTONIX) EC tablet 40 mg  40 mg Oral Daily Thurnell Lose, MD   40 mg at 10/06/21 4315   sodium chloride flush (NS) 0.9 % injection 10-40 mL  10-40 mL Intracatheter Q12H Olalere, Adewale A, MD   10 mL at 10/06/21 0952   sodium chloride flush (NS) 0.9 % injection 10-40 mL  10-40 mL Intracatheter PRN Olalere, Adewale A, MD       sodium chloride flush (NS) 0.9 % injection 3 mL  3 mL Intravenous Q12H Bowser, Grace E, NP   3 mL at 10/05/21 2214   sodium chloride flush (NS) 0.9 % injection 3 mL  3 mL Intravenous PRN Cristal Generous, NP   3 mL at 10/05/21 2213   tamsulosin (FLOMAX) capsule 0.4 mg  0.4 mg Oral Daily Shelly Coss, MD   0.4 mg at 10/06/21 4008   thiamine tablet 100 mg  100 mg Oral Daily Thurnell Lose, MD   100 mg at 10/06/21 6761   Or   thiamine (B-1) injection 100 mg  100 mg Intravenous Daily Thurnell Lose, MD   100 mg at 10/01/21 0805   vitamin B-12 (CYANOCOBALAMIN) tablet 1,000 mcg  1,000 mcg Oral Daily Thurnell Lose, MD   1,000 mcg at 10/06/21 9509     Discharge Medications: Please see discharge summary for a list of discharge medications.  Relevant Imaging Results:  Relevant Lab Results:   Additional Information SSn: [id number]. Pf[employer] 11/16/19,12/11/19, 08/22/20, 05/28/21  [name], LCSW

## 2021-10-06 NOTE — Plan of Care (Signed)
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
## 2021-10-06 NOTE — Plan of Care (Signed)

## 2021-10-06 NOTE — TOC Progression Note (Addendum)
Transition of Care [facility]) - Progression Note    Patient Details  Name: Evan Chambers MRN: [id number] Date of Birth: 09-29-1961  Transition of Care [facility] CM/SW Contact  [name], LCSW Phone Number: 10/06/2021, 9:45 AM  Clinical Narrative:   CSW spoke with [name] at [facility], pt was there from October 2022 until his return to the hospital, was still a rehab pt and was using copay days.  She will check with her business office for how many days have been used.  They would be willing to take him back again.  [name] said she knows applying for medicaid was discussed, but she is not sure if it was done.  Pt was scheduled for DC back home with daughter before he returned to the hospital.  [employer] closed today for holiday, will be tomorrow before she can get information.    CSW emailed [name]/Financial counseling and asked her to check for pending medicaid application and if none, to please screen for eligibility.    CSW spoke with [name].  She was not being billed copay days for pt stay at [facility], insurance was still covering everything.  Pt is on disability, but his disability income was not going to [employer].  She cannot afford copays. She would be OK with pt returning to [place], as long as the financial situation was the same, with no copays.      Expected Discharge Plan: [place] Barriers to Discharge: Continued Medical Work up, SNF Pending bed offer  Expected Discharge Plan and Services Expected Discharge Plan: [place] In-house Referral: Clinical Social Work   Post Acute Care Choice: [place] Living arrangements for the past 2 months: [facility]                                       Social Determinants of Health (SDOH) Interventions    Readmission Risk Interventions No flowsheet data found.

## 2021-10-06 NOTE — Progress Notes (Signed)
Critical Result  HGB 10.8 (increase)   Lab will redraw  Reported by [facility]

## 2021-10-06 NOTE — Progress Notes (Signed)
Initial Nutrition Assessment  DOCUMENTATION CODES:   Evan applicable  INTERVENTION:  -Ensure Enlive po TID, each supplement provides 350 kcal and 20 grams of protein -MVI with minerals daily  NUTRITION DIAGNOSIS:   Increased nutrient needs related to acute illness (sepsis) as evidenced by estimated needs.  GOAL:   Patient will meet greater than or equal to 90% of their needs  MONITOR:   Labs, PO intake, Supplement acceptance, Skin, Weight trends, I & O's  REASON FOR ASSESSMENT:   Consult Assessment of nutrition requirement/status  ASSESSMENT:   Pt with PMH significant for type 2 DM, L AKA, chronic alcoholism, Wernicke's encephalopathy, empyema s/p chest placement and VATS in Sept 2022 admitted from SNF with sepsis 2/2 PNA.  Pt was transferred to [facility] service temporarily d/t persistent hypotension, but is now hemodynamically stable and pending discharge to SNF.   Pt unavailable at time of Evan Chambers visit. Per RN, pt has had good PO intake. Weight history reviewed. Pt weighed 65.3 kg during last admission 07/29/21 and 54.5 kg upon this admission 09/30/21. This indicates a clinically significant 16.5% weight loss x2 months. Based on Evan Chambers assessment from previous admission 2 months ago and from continued weight loss, pt likely continues to meet criteria for malnutrition in context of chronic illness. Unable to diagnose at this time without detailed diet history and/or nutrition-focused physical exam. Evan Chambers will follow-up as able to obtain both. Noted that pt tolerated Ensure supplements well during previous admission, so Evan Chambers will order these again given pt with increased nutrient needs.   PO Intake: 25-100% x 4 recorded meals (75% avg meal intake)   UOP: 3028ml x24 hours I/O: +7416ml since admit  Medications:  sodium chloride   Intravenous Once   amoxicillin-clavulanate  1 tablet Oral Q12H   Chlorhexidine Gluconate Cloth  6 each Topical Daily   folic acid  1 mg Oral Daily   heparin  injection (subcutaneous)  5,000 Units Subcutaneous Q8H   metoprolol tartrate  25 mg Oral BID   multivitamin with minerals  1 tablet Oral Daily   pantoprazole  40 mg Oral Daily   sodium chloride flush  10-40 mL Intracatheter Q12H   sodium chloride flush  3 mL Intravenous Q12H   tamsulosin  0.4 mg Oral Daily   thiamine  100 mg Oral Daily   Or   thiamine  100 mg Intravenous Daily   vitamin B-12  1,000 mcg Oral Daily  Labs: Recent Labs  Lab 10/01/21 0500 10/02/21 0437 10/03/21 0912 10/04/21 0156 10/05/21 0150 10/06/21 0404  NA 135 136 139 138 138 138  K 4.4 3.3* 3.1* 3.0* 4.0 4.1  CL 110 110 111 107 108 104  CO2 14* 21* 24 24 24 23   BUN 11 8 <5* <5* <5* <5*  CREATININE 0.97 0.71 0.58* 0.59* 0.65 0.83  CALCIUM 7.8* 7.7* 7.9* 8.0* 8.3* 9.3  MG 2.1 1.9 1.7  --   --   --   GLUCOSE 119* 106* 90 88 92 86  CBGs: 82-127 x24 hours  Diet Order:   Diet Order             DIET SOFT Room service appropriate? Yes; Fluid consistency: Thin  Diet effective now                   EDUCATION NEEDS:   No education needs have been identified at this time  Skin:  Skin Assessment: Skin Integrity Issues: Skin Integrity Issues:: Other (Comment) Other: MASD groin; open wound bilateral buttocks  Last BM:  1/1  Height:   Ht Readings from Last 1 Encounters:  07/29/21 5\' 7"  (1.702 m)    Weight:   Wt Readings from Last 1 Encounters:  09/30/21 54.5 kg    BMI:  Body mass index is 18.82 kg/m.  Estimated Nutritional Needs:   Kcal:  1900-2100  Protein:  95-105 grams  Fluid:  >1.9L/d     Theone Stanley., Evan Chambers, Evan Chambers, Evan (she/her/hers) Evan Chambers pager number and weekend/on-call pager number located in [place].

## 2021-10-07 DIAGNOSIS — R6520 Severe sepsis without septic shock: Secondary | ICD-10-CM | POA: Diagnosis not present

## 2021-10-07 DIAGNOSIS — R4182 Altered mental status, unspecified: Secondary | ICD-10-CM | POA: Diagnosis not present

## 2021-10-07 DIAGNOSIS — Z743 Need for continuous supervision: Secondary | ICD-10-CM | POA: Diagnosis not present

## 2021-10-07 DIAGNOSIS — A021 Salmonella sepsis: Secondary | ICD-10-CM | POA: Diagnosis not present

## 2021-10-07 DIAGNOSIS — G6281 Critical illness polyneuropathy: Secondary | ICD-10-CM | POA: Diagnosis not present

## 2021-10-07 LAB — GLUCOSE, CAPILLARY: Glucose-Capillary: 91 mg/dL (ref 70–99)

## 2021-10-07 LAB — RESP PANEL BY RT-PCR (FLU A&B, COVID) ARPGX2
Influenza A by PCR: NEGATIVE
Influenza B by PCR: NEGATIVE
SARS Coronavirus 2 by RT PCR: NEGATIVE

## 2021-10-07 MED ORDER — SODIUM CHLORIDE 0.9 % IV SOLN
510.0000 mg | Freq: Once | INTRAVENOUS | Status: AC
  Administered 2021-10-07: 510 mg via INTRAVENOUS
  Filled 2021-10-07: qty 17

## 2021-10-07 MED ORDER — CYANOCOBALAMIN 1000 MCG PO TABS: 1000.0000 ug | ORAL_TABLET | Freq: Every day | ORAL | Status: AC

## 2021-10-07 MED ORDER — FERROUS SULFATE 325 (65 FE) MG PO TABS: 325.0000 mg | ORAL_TABLET | Freq: Every day | ORAL | Status: DC

## 2021-10-07 MED ORDER — TAMSULOSIN HCL 0.4 MG PO CAPS: 0.4000 mg | ORAL_CAPSULE | Freq: Every day | ORAL | Status: AC

## 2021-10-07 MED ORDER — METOPROLOL TARTRATE 25 MG PO TABS: 25.0000 mg | ORAL_TABLET | Freq: Two times a day (BID) | ORAL | Status: AC

## 2021-10-07 MED ORDER — ENSURE ENLIVE PO LIQD: 237.0000 mL | Freq: Three times a day (TID) | ORAL | 12 refills | Status: AC

## 2021-10-07 MED ORDER — THIAMINE HCL 100 MG PO TABS: 100.0000 mg | ORAL_TABLET | Freq: Every day | ORAL | Status: AC

## 2021-10-07 MED ORDER — FERROUS SULFATE 325 (65 FE) MG PO TABS: 325.0000 mg | ORAL_TABLET | Freq: Every day | ORAL | 3 refills | Status: AC

## 2021-10-07 MED ORDER — PANTOPRAZOLE SODIUM 40 MG PO TBEC: 40.0000 mg | DELAYED_RELEASE_TABLET | Freq: Every day | ORAL | Status: AC

## 2021-10-07 MED ORDER — FOLIC ACID 1 MG PO TABS: 1.0000 mg | ORAL_TABLET | Freq: Every day | ORAL | Status: AC

## 2021-10-07 NOTE — Plan of Care (Addendum)
Report given to Amy RN at [facility]. No prosthetic leg at bedside nor documentation of it. Patient stable and ready for DC. Problem: Education: Goal: Knowledge of General Education information will improve Description: Including pain rating scale, medication(s)/side effects and non-pharmacologic comfort measures Outcome: Progressing   Problem: Activity: Goal: Risk for activity intolerance will decrease Outcome: Progressing   Problem: Pain Managment: Goal: General experience of comfort will improve Outcome: Progressing   Problem: Safety: Goal: Ability to remain free from injury will improve Outcome: Progressing   Problem: Skin Integrity: Goal: Risk for impaired skin integrity will decrease Outcome: Progressing

## 2021-10-07 NOTE — Care Management Important Message (Signed)
Important Message  Patient Details  Name: Evan Chambers MRN: [id number] Date of Birth: [birth year]   Medicare Important Message Given:  Yes     Hannah Beat 10/07/2021, 2:04 PM

## 2021-10-07 NOTE — Progress Notes (Signed)
Physical Therapy Treatment Patient Details Name: Evan Chambers MRN: [id number] DOB: [birth year] Today's Date: 10/07/2021   History of Present Illness Pt is a 61 y/o male admitted secondary to weakness and hypotension. Found to be in septic shock secondary to PNA. PMH includes L AKA, HTN, DM, ETOH use with recent admission for Wernickes encephalopathy.    PT Comments    Patient progressing well towards PT goals. Noted to have cognitive deficits relating to orientation, memory and awareness. Moves well with use of RW and supervision for safety; does not have prosthesis here. VSS on RA. Tolerated chair sit ups for overall strengthening. Wanting to finish breakfast. Will follow.   Recommendations for follow up therapy are one component of a multi-disciplinary discharge planning process, led by the attending physician.  Recommendations may be updated based on patient status, additional functional criteria and insurance authorization.  Follow Up Recommendations  Skilled nursing-short term rehab (<3 hours/day)     Assistance Recommended at Discharge Set up Supervision/Assistance  Patient can return home with the following A little help with walking and/or transfers;Assistance with cooking/housework;Direct supervision/assist for medications management;Assist for transportation;Direct supervision/assist for financial management;A little help with bathing/dressing/bathroom   Equipment Recommendations  Rolling walker (2 wheels)    Recommendations for Other Services       Precautions / Restrictions Precautions Precautions: Fall Precaution Comments: L AKA, uses prosthetic normally but not present Restrictions Weight Bearing Restrictions: No     Mobility  Bed Mobility Overal bed mobility: Needs Assistance Bed Mobility: Supine to Sit     Supine to sit: Supervision;HOB elevated     General bed mobility comments: No assist needed, use of rail to elevate trunk. + dizziness.     Transfers Overall transfer level: Needs assistance Equipment used: Rolling walker (2 wheels) Transfers: Sit to/from Stand Sit to Stand: Min guard           General transfer comment: Min guard for safety. Stood from [employer], cues for hand placement as pt wanting to pull up on RW. Transferred to chair post ambulation.    Ambulation/Gait Ambulation/Gait assistance: Min guard Gait Distance (Feet): 15 Feet Assistive device: Rolling walker (2 wheels)         General Gait Details: "hop to" gait with RW, steady, no evidence of imbalance or knee instability.   Stairs             Wheelchair Mobility    Modified Rankin (Stroke Patients Only)       Balance Overall balance assessment: Needs assistance Sitting-balance support: Feet supported;No upper extremity supported Sitting balance-Leahy Scale: Good     Standing balance support: During functional activity;Reliant on assistive device for balance Standing balance-Leahy Scale: Poor                              Cognition Arousal/Alertness: Awake/alert Behavior During Therapy: WFL for tasks assessed/performed Overall Cognitive Status: Impaired/Different from baseline Area of Impairment: Orientation;Memory;Awareness                 Orientation Level: Disoriented to;Place;Time;Situation   Memory: Decreased short-term memory     Awareness: Intellectual   General Comments: Confused. Thinks it is November, "how long have I been here?"  STM deficits, not recalling info shared within session, "I had my leg on earlier," but it is not in the hospital.        Exercises Other Exercises Other Exercises: chair sit ups x10  General Comments General comments (skin integrity, edema, etc.): VSS stable on RA.      Pertinent Vitals/Pain Pain Assessment: No/denies pain    Home Living                          Prior Function            PT Goals (current goals can now be found in the  care plan section) Progress towards PT goals: Progressing toward goals    Frequency    Min 2X/week      PT Plan Current plan remains appropriate    Co-evaluation              AM-PAC PT "6 Clicks" Mobility   Outcome Measure  Help needed turning from your back to your side while in a flat bed without using bedrails?: None Help needed moving from lying on your back to sitting on the side of a flat bed without using bedrails?: None Help needed moving to and from a bed to a chair (including a wheelchair)?: A Little Help needed standing up from a chair using your arms (e.g., wheelchair or bedside chair)?: A Little Help needed to walk in hospital room?: A Little Help needed climbing 3-5 steps with a railing? : A Lot 6 Click Score: 19    End of Session Equipment Utilized During Treatment: Gait belt Activity Tolerance: Patient tolerated treatment well Patient left: in chair;with call bell/phone within reach;with chair alarm set Nurse Communication: Mobility status PT Visit Diagnosis: Muscle weakness (generalized) (M62.81);Other abnormalities of gait and mobility (R26.89);Difficulty in walking, not elsewhere classified (R26.2)     Time: 2426-8341 PT Time Calculation (min) (ACUTE ONLY): 22 min  Charges:  $Therapeutic Activity: 8-22 mins                     Marisa Severin, PT, DPT Acute Rehabilitation Services Pager [phone] Office [place] 10/07/2021, 11:12 AM

## 2021-10-07 NOTE — Progress Notes (Signed)
Occupational Therapy Treatment Patient Details Name: Evan Chambers MRN: [id number] DOB: Feb 03, 1961 Today's Date: 10/07/2021   History of present illness Pt is a 61 y/o male admitted secondary to weakness and hypotension. Found to be in septic shock secondary to PNA. PMH includes L AKA, HTN, DM, ETOH use with recent admission for Wernickes encephalopathy.   OT comments  Pt set up for seated ADLs, min guard for transfers as pt is up in chair upon arrival. Pt able to perform sit to stand transfer x3 during session, requiring increased cuing for hand placement to push from chair vs. Pull up from RW. Pt presenting with impairments listed below, will continue to follow acutely. Recommend d/c to SNF.   Recommendations for follow up therapy are one component of a multi-disciplinary discharge planning process, led by the attending physician.  Recommendations may be updated based on patient status, additional functional criteria and insurance authorization.    Follow Up Recommendations  Skilled nursing-short term rehab (<3 hours/day)    Assistance Recommended at Discharge Frequent or constant Supervision/Assistance  Patient can return home with the following  A lot of help with walking and/or transfers;A little help with bathing/dressing/bathroom   Equipment Recommendations  Other (comment);None recommended by OT (defer to next venue of care)    Recommendations for Other Services      Precautions / Restrictions Precautions Precautions: Fall Precaution Comments: L AKA, uses prosthetic normally but not present Restrictions Weight Bearing Restrictions: No       Mobility Bed Mobility Overal bed mobility: Needs Assistance Bed Mobility: Supine to Sit     Supine to sit: Supervision;HOB elevated     General bed mobility comments: up in chair upon arrival    Transfers Overall transfer level: Needs assistance Equipment used: Rolling walker (2 wheels) Transfers: Sit to/from Stand Sit to  Stand: Min guard           General transfer comment: able to perform sit to stand transfer x3 from chair level     Balance Overall balance assessment: Needs assistance Sitting-balance support: Feet supported;No upper extremity supported Sitting balance-Leahy Scale: Good     Standing balance support: During functional activity;Reliant on assistive device for balance Standing balance-Leahy Scale: Poor                             ADL either performed or assessed with clinical judgement   ADL   Eating/Feeding: Set up;Sitting                                          Extremity/Trunk Assessment Upper Extremity Assessment Upper Extremity Assessment: Generalized weakness   Lower Extremity Assessment Lower Extremity Assessment: Defer to PT evaluation        Vision   Vision Assessment?: No apparent visual deficits   Perception Perception Perception: Not tested   Praxis Praxis Praxis: Not tested    Cognition Arousal/Alertness: Awake/alert Behavior During Therapy: WFL for tasks assessed/performed Overall Cognitive Status: Impaired/Different from baseline Area of Impairment: Orientation;Memory;Awareness                 Orientation Level: Time;Disoriented to   Memory: Decreased short-term memory     Awareness: Intellectual   General Comments: pt asking how long has been in the hospital, unable to recall despite having this conversation with PT earlier  Exercises Other Exercises Other Exercises: chair sit ups x10   Shoulder Instructions       General Comments VSS on RA, reports mild dizziness during session    Pertinent Vitals/ Pain       Pain Assessment: No/denies pain  Home Living                                          Prior Functioning/Environment              Frequency  Min 2X/week        Progress Toward Goals  OT Goals(current goals can now be found in the care plan  section)  Progress towards OT goals: Progressing toward goals  Acute Rehab OT Goals Patient Stated Goal: none stated OT Goal Formulation: With patient Time For Goal Achievement: 10/14/21 Potential to Achieve Goals: Good ADL Goals Pt Will Perform Upper Body Bathing: with set-up;sitting Pt Will Perform Lower Body Bathing: sit to/from stand;with min assist Pt Will Perform Upper Body Dressing: with set-up;sitting Pt Will Perform Lower Body Dressing: with min assist;sit to/from stand Pt Will Transfer to Toilet: with min assist;stand pivot transfer;bedside commode Pt Will Perform Toileting - Clothing Manipulation and hygiene: with min assist;sit to/from stand  Plan Discharge plan remains appropriate    Co-evaluation                 AM-PAC OT "6 Clicks" Daily Activity     Outcome Measure   Help from another person eating meals?: None Help from another person taking care of personal grooming?: A Little Help from another person toileting, which includes using toliet, bedpan, or urinal?: A Lot Help from another person bathing (including washing, rinsing, drying)?: A Lot Help from another person to put on and taking off regular upper body clothing?: A Little Help from another person to put on and taking off regular lower body clothing?: A Little 6 Click Score: 17    End of Session Equipment Utilized During Treatment: Gait belt;Rolling walker (2 wheels)  OT Visit Diagnosis: Unsteadiness on feet (R26.81);Cognitive communication deficit (R41.841);Muscle weakness (generalized) (M62.81)   Activity Tolerance Patient tolerated treatment well   Patient Left in chair;with call bell/phone within reach;with chair alarm set;with nursing/sitter in room   Nurse Communication Mobility status        Time: 2952-8413 OT Time Calculation (min): 14 min  Charges: OT General Charges $OT Visit: 1 Visit OT Treatments $Therapeutic Activity: 8-22 mins  Lynnda Child, OTD, OTR/L Acute  Rehab [phone] - [place] 10/07/2021, 11:30 AM

## 2021-10-07 NOTE — Discharge Summary (Signed)
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 surveillance program. It is not intended to diagnose MRSA infection nor to guide or monitor treatment for MRSA infections. Test performance is not FDA approved in patients less than 52 years old. Performed at [facility], [place] [address], [place], [place] [address]   Blood culture (routine x 2)     Status: None   Collection Time: 09/29/21  7:20 PM   Specimen: BLOOD  Result Value Ref Range Status   Specimen Description BLOOD LEFT ANTECUBITAL  Final   Special Requests   Final    BOTTLES DRAWN AEROBIC AND ANAEROBIC Blood Culture results may not be optimal due to an inadequate volume of blood received in culture bottles   Culture   Final    NO GROWTH 5 DAYS Performed at [facility], [place] [address] [place], [place] [address]    Report Status 10/04/2021 FINAL  Final  Resp Panel by RT-PCR (Flu A&B, Covid) Nasopharyngeal Swab     Status: None   Collection Time: 09/30/21  4:01 PM   Specimen: Nasopharyngeal Swab; Nasopharyngeal(NP) swabs in vial transport  medium  Result Value Ref Range Status   SARS Coronavirus 2 by RT PCR NEGATIVE NEGATIVE Final    Comment: (NOTE) SARS-CoV-2 target nucleic acids are NOT DETECTED.  The SARS-CoV-2 RNA is generally detectable in upper respiratory specimens during the acute phase of infection. The lowest concentration of SARS-CoV-2 viral copies this assay can detect is 138 copies/mL. A negative result  does not preclude SARS-Cov-2 infection and should not be used as the sole basis for treatment or other patient management decisions. A negative result may occur with  improper specimen collection/handling, submission of specimen other than nasopharyngeal swab, presence of viral mutation(s) within the areas targeted by this assay, and inadequate number of viral copies(<138 copies/mL). A negative result must be combined with clinical observations, patient history, and epidemiological information. The expected result is Negative.  Fact Sheet for Patients:  EntrepreneurPulse.com.au  Fact Sheet for Healthcare Providers:  IncredibleEmployment.be  This test is no t yet approved or cleared by the [place] FDA and  has been authorized for detection and/or diagnosis of SARS-CoV-2 by FDA under an Emergency Use Authorization (EUA). This EUA will remain  in effect (meaning this test can be used) for the duration of the COVID-19 declaration under Section 564(b)(1) of the Act, 21 U.S.C.section 360bbb-3(b)(1), unless the authorization is terminated  or revoked sooner.       Influenza A by PCR NEGATIVE NEGATIVE Final   Influenza B by PCR NEGATIVE NEGATIVE Final    Comment: (NOTE) The Xpert Xpress SARS-CoV-2/FLU/RSV plus assay is intended as an aid in the diagnosis of influenza from Nasopharyngeal swab specimens and should not be used as a sole basis for treatment. Nasal washings and aspirates are unacceptable for Xpert Xpress SARS-CoV-2/FLU/RSV testing.  Fact Sheet for  Patients: EntrepreneurPulse.com.au  Fact Sheet for Healthcare Providers: IncredibleEmployment.be  This test is not yet approved or cleared by the [place] FDA and has been authorized for detection and/or diagnosis of SARS-CoV-2 by FDA under an Emergency Use Authorization (EUA). This EUA will remain in effect (meaning this test can be used) for the duration of the COVID-19 declaration under Section 564(b)(1) of the Act, 21 U.S.C. section 360bbb-3(b)(1), unless the authorization is terminated or revoked.  Performed at [facility], [place] [address], [place], [place] [address]   Respiratory (~20 pathogens) panel by PCR     Status: None   Collection Time: 09/30/21  4:01 PM   Specimen: Nasopharyngeal Swab; Respiratory  Result Value Ref Range Status   Adenovirus NOT DETECTED NOT DETECTED Final   Coronavirus 229E NOT DETECTED NOT DETECTED Final    Comment: (NOTE) The Coronavirus on the Respiratory Panel, DOES NOT test for the novel  Coronavirus (2019 nCoV)    Coronavirus HKU1 NOT DETECTED NOT DETECTED Final   Coronavirus NL63 NOT DETECTED NOT DETECTED Final   Coronavirus OC43 NOT DETECTED NOT DETECTED Final   Metapneumovirus NOT DETECTED NOT DETECTED Final   Rhinovirus / Enterovirus NOT DETECTED NOT DETECTED Final   Influenza A NOT DETECTED NOT DETECTED Final   Influenza B NOT DETECTED NOT DETECTED Final   Parainfluenza Virus 1 NOT DETECTED NOT DETECTED Final   Parainfluenza Virus 2 NOT DETECTED NOT DETECTED Final   Parainfluenza Virus 3 NOT DETECTED NOT DETECTED Final   Parainfluenza Virus 4 NOT DETECTED NOT DETECTED Final   Respiratory Syncytial Virus NOT DETECTED NOT DETECTED Final   Bordetella pertussis NOT DETECTED NOT DETECTED Final   Bordetella Parapertussis NOT DETECTED NOT DETECTED Final   Chlamydophila pneumoniae NOT DETECTED NOT DETECTED Final   Mycoplasma pneumoniae NOT DETECTED NOT DETECTED Final    Comment: Performed at  [facility], [place]. [address], [place], [place] [address]     Labs: BNP (last 3 results) Recent Labs    10/01/21 0500 10/02/21 0437 10/03/21 0912  BNP 237.2* 77.8 366.2*   Basic Metabolic Panel: Recent Labs  Lab 10/01/21 0500 10/02/21 0437 10/03/21 0912 10/04/21 0156 10/05/21 0150 10/06/21 0404  NA 135 136 139 138 138 138  K 4.4 3.3* 3.1* 3.0* 4.0 4.1  CL 110 110 111 107 108 104  CO2 14* 21* 24 24 24 23   GLUCOSE 119* 106* 90 88 92 86  BUN 11 8 <5* <5* <5* <5*  CREATININE 0.97 0.71 0.58* 0.59* 0.65 0.83  CALCIUM 7.8* 7.7* 7.9* 8.0* 8.3* 9.3  MG 2.1 1.9 1.7  --   --   --    Liver Function Tests: Recent Labs  Lab 10/01/21 0500 10/02/21 0437 10/03/21 0912 10/05/21 0150  AST 14* 8* 10* 16  ALT 10 5 9 12   ALKPHOS 40 67 114 109  BILITOT 1.3* 1.2 2.0* 1.1  PROT 4.5* 4.4* 4.6* 4.9*  ALBUMIN 1.9* 1.8* 1.8* 2.0*   No results for input(s): LIPASE, AMYLASE in the last 168 hours. No results for input(s): AMMONIA in the last 168 hours. CBC: Recent Labs  Lab 10/01/21 0500 10/02/21 0437 10/03/21 0912 10/04/21 0156 10/05/21 0150 10/06/21 0724  WBC 14.5* 11.8* 8.9 5.9 6.1 6.5  NEUTROABS 12.2* 9.8* 6.3  --   --  3.2  HGB 8.7* 7.0* 7.5* 7.6* 8.3* 8.8*  HCT 25.7* 19.9* 22.0* 21.6* 23.9* 25.8*  MCV 83.2 81.9 82.1 81.2 81.3 82.4  PLT 92* 85* 88* 97* 122* 179   Cardiac Enzymes: No results for input(s): CKTOTAL, CKMB, CKMBINDEX, TROPONINI in the last 168 hours. BNP: Invalid input(s): POCBNP CBG: Recent Labs  Lab 10/06/21 1631 10/06/21 2059 10/06/21 2109 10/06/21 2317 10/07/21 0345  GLUCAP 102* 96 122* 104* 91   D-Dimer No results for input(s): DDIMER in the last 72 hours. Hgb A1c No results for input(s): HGBA1C in the last 72 hours. Lipid Profile No results for input(s): CHOL, HDL, LDLCALC, TRIG, CHOLHDL, LDLDIRECT in the last 72 hours. Thyroid function studies No results for input(s): TSH, T4TOTAL, T3FREE, THYROIDAB in the last 72 hours.  Invalid  input(s): FREET3 Anemia work up No results for input(s): VITAMINB12, FOLATE, FERRITIN, TIBC, IRON, RETICCTPCT in the last 72 hours. Urinalysis    Component Value Date/Time   COLORURINE YELLOW 09/18/2021 1805   APPEARANCEUR CLEAR 09/18/2021 1805   LABSPEC 1.025 09/18/2021 1805   PHURINE 6.0 09/18/2021 1805   GLUCOSEU NEGATIVE 09/18/2021 1805   HGBUR NEGATIVE 09/18/2021 1805   BILIRUBINUR NEGATIVE 09/18/2021 1805   KETONESUR NEGATIVE 09/18/2021 1805   PROTEINUR NEGATIVE 09/18/2021 1805   NITRITE NEGATIVE 09/18/2021 1805   LEUKOCYTESUR NEGATIVE 09/18/2021 1805   Sepsis Labs Invalid input(s): PROCALCITONIN,  WBC,  LACTICIDVEN Microbiology Recent Results (from the past 240 hour(s))  MRSA Next Gen by PCR, Nasal     Status: None   Collection Time: 09/29/21  5:04 PM   Specimen: Nasal Mucosa; Nasal Swab  Result Value Ref Range Status   MRSA by PCR Next Gen NOT DETECTED NOT DETECTED Final    Comment: (NOTE) The GeneXpert MRSA Assay (FDA approved for NASAL specimens only), is one component of a comprehensive MRSA colonization surveillance program. It is not intended to diagnose MRSA infection nor to guide or monitor treatment for MRSA infections. Test performance is not FDA approved in patients less than 41 years old. Performed at [facility], [place] [address], [place], [place] [address]   Blood culture (routine x 2)     Status: None   Collection Time: 09/29/21  7:20 PM   Specimen: BLOOD  Result Value Ref Range Status   Specimen  Description BLOOD LEFT ANTECUBITAL  Final   Special Requests   Final    BOTTLES DRAWN AEROBIC AND ANAEROBIC Blood Culture results may not be optimal due to an inadequate volume of blood received in culture bottles   Culture   Final    NO GROWTH 5 DAYS Performed at [facility], [place] [address], [place], [place] [address]    Report Status 10/04/2021 FINAL  Final  Resp Panel by RT-PCR (Flu A&B, Covid) Nasopharyngeal Swab     Status: None    Collection Time: 09/30/21  4:01 PM   Specimen: Nasopharyngeal Swab; Nasopharyngeal(NP) swabs in vial transport medium  Result Value Ref Range Status   SARS Coronavirus 2 by RT PCR NEGATIVE NEGATIVE Final    Comment: (NOTE) SARS-CoV-2 target nucleic acids are NOT DETECTED.  The SARS-CoV-2 RNA is generally detectable in upper respiratory specimens during the acute phase of infection. The lowest concentration of SARS-CoV-2 viral copies this assay can detect is 138 copies/mL. A negative result does not preclude SARS-Cov-2 infection and should not be used as the sole basis for treatment or other patient management decisions. A negative result may occur with  improper specimen collection/handling, submission of specimen other than nasopharyngeal swab, presence of viral mutation(s) within the areas targeted by this assay, and inadequate number of viral copies(<138 copies/mL). A negative result must be combined with clinical observations, patient history, and epidemiological information. The expected result is Negative.  Fact Sheet for Patients:  EntrepreneurPulse.com.au  Fact Sheet for Healthcare Providers:  IncredibleEmployment.be  This test is no t yet approved or cleared by the [place] FDA and  has been authorized for detection and/or diagnosis of SARS-CoV-2 by FDA under an Emergency Use Authorization (EUA). This EUA will remain  in effect (meaning this test can be used) for the duration of the COVID-19 declaration under Section 564(b)(1) of the Act, 21 U.S.C.section 360bbb-3(b)(1), unless the authorization is terminated  or revoked sooner.       Influenza A by PCR NEGATIVE NEGATIVE Final   Influenza B by PCR NEGATIVE NEGATIVE Final    Comment: (NOTE) The Xpert Xpress SARS-CoV-2/FLU/RSV plus assay is intended as an aid in the diagnosis of influenza from Nasopharyngeal swab specimens and should not be used as a sole basis for treatment.  Nasal washings and aspirates are unacceptable for Xpert Xpress SARS-CoV-2/FLU/RSV testing.  Fact Sheet for Patients: EntrepreneurPulse.com.au  Fact Sheet for Healthcare Providers: IncredibleEmployment.be  This test is not yet approved or cleared by the [place] FDA and has been authorized for detection and/or diagnosis of SARS-CoV-2 by FDA under an Emergency Use Authorization (EUA). This EUA will remain in effect (meaning this test can be used) for the duration of the COVID-19 declaration under Section 564(b)(1) of the Act, 21 U.S.C. section 360bbb-3(b)(1), unless the authorization is terminated or revoked.  Performed at [facility], [place] [address], [place], [place] [address]   Respiratory (~20 pathogens) panel by PCR     Status: None   Collection Time: 09/30/21  4:01 PM   Specimen: Nasopharyngeal Swab; Respiratory  Result Value Ref Range Status   Adenovirus NOT DETECTED NOT DETECTED Final   Coronavirus 229E NOT DETECTED NOT DETECTED Final    Comment: (NOTE) The Coronavirus on the Respiratory Panel, DOES NOT test for the novel  Coronavirus (2019 nCoV)    Coronavirus HKU1 NOT DETECTED NOT DETECTED Final   Coronavirus NL63 NOT DETECTED NOT DETECTED Final   Coronavirus OC43 NOT DETECTED NOT DETECTED Final  Metapneumovirus NOT DETECTED NOT DETECTED Final   Rhinovirus / Enterovirus NOT DETECTED NOT DETECTED Final   Influenza A NOT DETECTED NOT DETECTED Final   Influenza B NOT DETECTED NOT DETECTED Final   Parainfluenza Virus 1 NOT DETECTED NOT DETECTED Final   Parainfluenza Virus 2 NOT DETECTED NOT DETECTED Final   Parainfluenza Virus 3 NOT DETECTED NOT DETECTED Final   Parainfluenza Virus 4 NOT DETECTED NOT DETECTED Final   Respiratory Syncytial Virus NOT DETECTED NOT DETECTED Final   Bordetella pertussis NOT DETECTED NOT DETECTED Final   Bordetella Parapertussis NOT DETECTED NOT DETECTED Final   Chlamydophila pneumoniae NOT  DETECTED NOT DETECTED Final   Mycoplasma pneumoniae NOT DETECTED NOT DETECTED Final    Comment: Performed at [facility], [place] [address] [place], [place] [address]    Please note: You were cared for by a hospitalist during your hospital stay. Once you are discharged, your primary care physician will handle any further medical issues. Please note that NO REFILLS for any discharge medications will be authorized once you are discharged, as it is imperative that you return to your primary care physician (or establish a relationship with a primary care physician if you do not have one) for your post hospital discharge needs so that they can reassess your need for medications and monitor your lab values.    Time coordinating discharge: 40 minutes  SIGNED:   Shelly Coss, MD  [facility] 10/07/2021, 10:09 AM Pager [phone]  If 7PM-7AM, please contact night-coverage [email or web address] Password [email or web address]

## 2021-10-07 NOTE — TOC Transition Note (Signed)
Transition of Care [facility]) - CM/SW Discharge Note   Patient Details  Name: Evan Chambers MRN: [id number] Date of Birth: 07/01/1961  [facility] CM/SW Contact:  [name], LCSW Phone Number: 10/07/2021, 1:19 PM   Clinical Narrative:  Pt discharging to [place].  RN call [phone] for report.       Final next level of care: [place] Barriers to Discharge: Barriers Resolved   Patient Goals and CMS Choice Patient states their goals for this hospitalization and ongoing recovery are:: "get back to work" [employer].gov Compare Post Acute Care list provided to:: Patient Choice offered to / list presented to : Patient  Discharge Placement              Patient chooses bed at:  ([facility]) Patient to be transferred to facility by: [place] Name of family member notified: [name] Patient and family notified of of transfer: 10/07/21  Discharge Plan and Services In-house Referral: Clinical Social Work   Post Acute Care Choice: [place]                               Social Determinants of Health ([place]) Interventions     Readmission Risk Interventions No flowsheet data found.

## 2021-10-07 NOTE — Care Management Important Message (Deleted)
Important Message  Patient Details  Name: Evan Chambers MRN: [id number] Date of Birth: 1961/09/21   Medicare Important Message Given:  Yes     Hannah Beat 10/07/2021, 2:07 PM

## 2021-10-08 DIAGNOSIS — R262 Difficulty in walking, not elsewhere classified: Secondary | ICD-10-CM | POA: Diagnosis not present

## 2021-10-08 DIAGNOSIS — R2689 Other abnormalities of gait and mobility: Secondary | ICD-10-CM | POA: Diagnosis not present

## 2021-10-08 DIAGNOSIS — A419 Sepsis, unspecified organism: Secondary | ICD-10-CM | POA: Diagnosis not present

## 2021-10-08 DIAGNOSIS — D61818 Other pancytopenia: Secondary | ICD-10-CM | POA: Diagnosis not present

## 2021-10-08 DIAGNOSIS — E1159 Type 2 diabetes mellitus with other circulatory complications: Secondary | ICD-10-CM | POA: Diagnosis not present

## 2021-10-08 DIAGNOSIS — I10 Essential (primary) hypertension: Secondary | ICD-10-CM | POA: Diagnosis not present

## 2021-10-08 DIAGNOSIS — J1081 Influenza due to other identified influenza virus with encephalopathy: Secondary | ICD-10-CM | POA: Diagnosis not present

## 2021-10-08 DIAGNOSIS — R41841 Cognitive communication deficit: Secondary | ICD-10-CM | POA: Diagnosis not present

## 2021-10-08 DIAGNOSIS — R339 Retention of urine, unspecified: Secondary | ICD-10-CM | POA: Diagnosis not present

## 2021-10-08 DIAGNOSIS — J189 Pneumonia, unspecified organism: Secondary | ICD-10-CM | POA: Diagnosis not present

## 2021-10-08 DIAGNOSIS — G9341 Metabolic encephalopathy: Secondary | ICD-10-CM | POA: Diagnosis not present

## 2021-10-08 DIAGNOSIS — M6281 Muscle weakness (generalized): Secondary | ICD-10-CM | POA: Diagnosis not present

## 2021-10-08 DIAGNOSIS — R1312 Dysphagia, oropharyngeal phase: Secondary | ICD-10-CM | POA: Diagnosis not present

## 2021-10-09 DIAGNOSIS — E1159 Type 2 diabetes mellitus with other circulatory complications: Secondary | ICD-10-CM | POA: Diagnosis not present

## 2021-10-09 DIAGNOSIS — R1312 Dysphagia, oropharyngeal phase: Secondary | ICD-10-CM | POA: Diagnosis not present

## 2021-10-09 DIAGNOSIS — R41841 Cognitive communication deficit: Secondary | ICD-10-CM | POA: Diagnosis not present

## 2021-10-09 DIAGNOSIS — R2689 Other abnormalities of gait and mobility: Secondary | ICD-10-CM | POA: Diagnosis not present

## 2021-10-09 DIAGNOSIS — M6281 Muscle weakness (generalized): Secondary | ICD-10-CM | POA: Diagnosis not present

## 2021-10-09 DIAGNOSIS — R262 Difficulty in walking, not elsewhere classified: Secondary | ICD-10-CM | POA: Diagnosis not present

## 2021-10-09 DIAGNOSIS — J1081 Influenza due to other identified influenza virus with encephalopathy: Secondary | ICD-10-CM | POA: Diagnosis not present

## 2021-10-09 DIAGNOSIS — A419 Sepsis, unspecified organism: Secondary | ICD-10-CM | POA: Diagnosis not present

## 2021-10-10 DIAGNOSIS — A419 Sepsis, unspecified organism: Secondary | ICD-10-CM | POA: Diagnosis not present

## 2021-10-10 DIAGNOSIS — R262 Difficulty in walking, not elsewhere classified: Secondary | ICD-10-CM | POA: Diagnosis not present

## 2021-10-10 DIAGNOSIS — R1312 Dysphagia, oropharyngeal phase: Secondary | ICD-10-CM | POA: Diagnosis not present

## 2021-10-10 DIAGNOSIS — R2689 Other abnormalities of gait and mobility: Secondary | ICD-10-CM | POA: Diagnosis not present

## 2021-10-10 DIAGNOSIS — R41841 Cognitive communication deficit: Secondary | ICD-10-CM | POA: Diagnosis not present

## 2021-10-10 DIAGNOSIS — E1159 Type 2 diabetes mellitus with other circulatory complications: Secondary | ICD-10-CM | POA: Diagnosis not present

## 2021-10-10 DIAGNOSIS — J1081 Influenza due to other identified influenza virus with encephalopathy: Secondary | ICD-10-CM | POA: Diagnosis not present

## 2021-10-10 DIAGNOSIS — M6281 Muscle weakness (generalized): Secondary | ICD-10-CM | POA: Diagnosis not present

## 2021-10-12 DIAGNOSIS — R41841 Cognitive communication deficit: Secondary | ICD-10-CM | POA: Diagnosis not present

## 2021-10-12 DIAGNOSIS — R262 Difficulty in walking, not elsewhere classified: Secondary | ICD-10-CM | POA: Diagnosis not present

## 2021-10-12 DIAGNOSIS — M6281 Muscle weakness (generalized): Secondary | ICD-10-CM | POA: Diagnosis not present

## 2021-10-12 DIAGNOSIS — J1081 Influenza due to other identified influenza virus with encephalopathy: Secondary | ICD-10-CM | POA: Diagnosis not present

## 2021-10-12 DIAGNOSIS — E1159 Type 2 diabetes mellitus with other circulatory complications: Secondary | ICD-10-CM | POA: Diagnosis not present

## 2021-10-12 DIAGNOSIS — R1312 Dysphagia, oropharyngeal phase: Secondary | ICD-10-CM | POA: Diagnosis not present

## 2021-10-12 DIAGNOSIS — R2689 Other abnormalities of gait and mobility: Secondary | ICD-10-CM | POA: Diagnosis not present

## 2021-10-12 DIAGNOSIS — A419 Sepsis, unspecified organism: Secondary | ICD-10-CM | POA: Diagnosis not present

## 2021-10-13 DIAGNOSIS — R2689 Other abnormalities of gait and mobility: Secondary | ICD-10-CM | POA: Diagnosis not present

## 2021-10-13 DIAGNOSIS — A419 Sepsis, unspecified organism: Secondary | ICD-10-CM | POA: Diagnosis not present

## 2021-10-13 DIAGNOSIS — E1159 Type 2 diabetes mellitus with other circulatory complications: Secondary | ICD-10-CM | POA: Diagnosis not present

## 2021-10-13 DIAGNOSIS — R41841 Cognitive communication deficit: Secondary | ICD-10-CM | POA: Diagnosis not present

## 2021-10-13 DIAGNOSIS — J1081 Influenza due to other identified influenza virus with encephalopathy: Secondary | ICD-10-CM | POA: Diagnosis not present

## 2021-10-13 DIAGNOSIS — R262 Difficulty in walking, not elsewhere classified: Secondary | ICD-10-CM | POA: Diagnosis not present

## 2021-10-13 DIAGNOSIS — R1312 Dysphagia, oropharyngeal phase: Secondary | ICD-10-CM | POA: Diagnosis not present

## 2021-10-13 DIAGNOSIS — M6281 Muscle weakness (generalized): Secondary | ICD-10-CM | POA: Diagnosis not present

## 2021-10-14 DIAGNOSIS — M6281 Muscle weakness (generalized): Secondary | ICD-10-CM | POA: Diagnosis not present

## 2021-10-14 DIAGNOSIS — R262 Difficulty in walking, not elsewhere classified: Secondary | ICD-10-CM | POA: Diagnosis not present

## 2021-10-14 DIAGNOSIS — R41841 Cognitive communication deficit: Secondary | ICD-10-CM | POA: Diagnosis not present

## 2021-10-14 DIAGNOSIS — R2689 Other abnormalities of gait and mobility: Secondary | ICD-10-CM | POA: Diagnosis not present

## 2021-10-14 DIAGNOSIS — E1159 Type 2 diabetes mellitus with other circulatory complications: Secondary | ICD-10-CM | POA: Diagnosis not present

## 2021-10-14 DIAGNOSIS — A419 Sepsis, unspecified organism: Secondary | ICD-10-CM | POA: Diagnosis not present

## 2021-10-14 DIAGNOSIS — R1312 Dysphagia, oropharyngeal phase: Secondary | ICD-10-CM | POA: Diagnosis not present

## 2021-10-14 DIAGNOSIS — J1081 Influenza due to other identified influenza virus with encephalopathy: Secondary | ICD-10-CM | POA: Diagnosis not present

## 2021-10-15 DIAGNOSIS — J1081 Influenza due to other identified influenza virus with encephalopathy: Secondary | ICD-10-CM | POA: Diagnosis not present

## 2021-10-15 DIAGNOSIS — R41841 Cognitive communication deficit: Secondary | ICD-10-CM | POA: Diagnosis not present

## 2021-10-15 DIAGNOSIS — R1312 Dysphagia, oropharyngeal phase: Secondary | ICD-10-CM | POA: Diagnosis not present

## 2021-10-15 DIAGNOSIS — M6281 Muscle weakness (generalized): Secondary | ICD-10-CM | POA: Diagnosis not present

## 2021-10-15 DIAGNOSIS — A419 Sepsis, unspecified organism: Secondary | ICD-10-CM | POA: Diagnosis not present

## 2021-10-15 DIAGNOSIS — R2689 Other abnormalities of gait and mobility: Secondary | ICD-10-CM | POA: Diagnosis not present

## 2021-10-15 DIAGNOSIS — E1159 Type 2 diabetes mellitus with other circulatory complications: Secondary | ICD-10-CM | POA: Diagnosis not present

## 2021-10-15 DIAGNOSIS — R262 Difficulty in walking, not elsewhere classified: Secondary | ICD-10-CM | POA: Diagnosis not present

## 2021-10-16 DIAGNOSIS — M6281 Muscle weakness (generalized): Secondary | ICD-10-CM | POA: Diagnosis not present

## 2021-10-16 DIAGNOSIS — R262 Difficulty in walking, not elsewhere classified: Secondary | ICD-10-CM | POA: Diagnosis not present

## 2021-10-16 DIAGNOSIS — J189 Pneumonia, unspecified organism: Secondary | ICD-10-CM | POA: Diagnosis not present

## 2021-10-16 DIAGNOSIS — R1312 Dysphagia, oropharyngeal phase: Secondary | ICD-10-CM | POA: Diagnosis not present

## 2021-10-16 DIAGNOSIS — E876 Hypokalemia: Secondary | ICD-10-CM | POA: Diagnosis not present

## 2021-10-16 DIAGNOSIS — R3981 Functional urinary incontinence: Secondary | ICD-10-CM | POA: Diagnosis not present

## 2021-10-16 DIAGNOSIS — A419 Sepsis, unspecified organism: Secondary | ICD-10-CM | POA: Diagnosis not present

## 2021-10-16 DIAGNOSIS — R2689 Other abnormalities of gait and mobility: Secondary | ICD-10-CM | POA: Diagnosis not present

## 2021-10-16 DIAGNOSIS — H1132 Conjunctival hemorrhage, left eye: Secondary | ICD-10-CM | POA: Diagnosis not present

## 2021-10-16 DIAGNOSIS — E119 Type 2 diabetes mellitus without complications: Secondary | ICD-10-CM | POA: Diagnosis not present

## 2021-10-16 DIAGNOSIS — J1081 Influenza due to other identified influenza virus with encephalopathy: Secondary | ICD-10-CM | POA: Diagnosis not present

## 2021-10-16 DIAGNOSIS — R41841 Cognitive communication deficit: Secondary | ICD-10-CM | POA: Diagnosis not present

## 2021-10-16 DIAGNOSIS — E1159 Type 2 diabetes mellitus with other circulatory complications: Secondary | ICD-10-CM | POA: Diagnosis not present

## 2021-10-16 DIAGNOSIS — R339 Retention of urine, unspecified: Secondary | ICD-10-CM | POA: Diagnosis not present

## 2021-10-16 DIAGNOSIS — Z7409 Other reduced mobility: Secondary | ICD-10-CM | POA: Diagnosis not present

## 2021-10-16 DIAGNOSIS — I10 Essential (primary) hypertension: Secondary | ICD-10-CM | POA: Diagnosis not present

## 2021-10-17 DIAGNOSIS — R262 Difficulty in walking, not elsewhere classified: Secondary | ICD-10-CM | POA: Diagnosis not present

## 2021-10-17 DIAGNOSIS — R2689 Other abnormalities of gait and mobility: Secondary | ICD-10-CM | POA: Diagnosis not present

## 2021-10-17 DIAGNOSIS — R41841 Cognitive communication deficit: Secondary | ICD-10-CM | POA: Diagnosis not present

## 2021-10-17 DIAGNOSIS — R1312 Dysphagia, oropharyngeal phase: Secondary | ICD-10-CM | POA: Diagnosis not present

## 2021-10-17 DIAGNOSIS — E1159 Type 2 diabetes mellitus with other circulatory complications: Secondary | ICD-10-CM | POA: Diagnosis not present

## 2021-10-17 DIAGNOSIS — J1081 Influenza due to other identified influenza virus with encephalopathy: Secondary | ICD-10-CM | POA: Diagnosis not present

## 2021-10-17 DIAGNOSIS — M6281 Muscle weakness (generalized): Secondary | ICD-10-CM | POA: Diagnosis not present

## 2021-10-17 DIAGNOSIS — A419 Sepsis, unspecified organism: Secondary | ICD-10-CM | POA: Diagnosis not present

## 2021-10-18 DIAGNOSIS — A419 Sepsis, unspecified organism: Secondary | ICD-10-CM | POA: Diagnosis not present

## 2021-10-18 DIAGNOSIS — J1081 Influenza due to other identified influenza virus with encephalopathy: Secondary | ICD-10-CM | POA: Diagnosis not present

## 2021-10-18 DIAGNOSIS — R262 Difficulty in walking, not elsewhere classified: Secondary | ICD-10-CM | POA: Diagnosis not present

## 2021-10-18 DIAGNOSIS — M6281 Muscle weakness (generalized): Secondary | ICD-10-CM | POA: Diagnosis not present

## 2021-10-18 DIAGNOSIS — R2689 Other abnormalities of gait and mobility: Secondary | ICD-10-CM | POA: Diagnosis not present

## 2021-10-18 DIAGNOSIS — E1159 Type 2 diabetes mellitus with other circulatory complications: Secondary | ICD-10-CM | POA: Diagnosis not present

## 2021-10-18 DIAGNOSIS — R41841 Cognitive communication deficit: Secondary | ICD-10-CM | POA: Diagnosis not present

## 2021-10-18 DIAGNOSIS — R1312 Dysphagia, oropharyngeal phase: Secondary | ICD-10-CM | POA: Diagnosis not present

## 2021-10-19 DIAGNOSIS — R1312 Dysphagia, oropharyngeal phase: Secondary | ICD-10-CM | POA: Diagnosis not present

## 2021-10-19 DIAGNOSIS — R41841 Cognitive communication deficit: Secondary | ICD-10-CM | POA: Diagnosis not present

## 2021-10-19 DIAGNOSIS — R262 Difficulty in walking, not elsewhere classified: Secondary | ICD-10-CM | POA: Diagnosis not present

## 2021-10-19 DIAGNOSIS — R2689 Other abnormalities of gait and mobility: Secondary | ICD-10-CM | POA: Diagnosis not present

## 2021-10-19 DIAGNOSIS — J1081 Influenza due to other identified influenza virus with encephalopathy: Secondary | ICD-10-CM | POA: Diagnosis not present

## 2021-10-19 DIAGNOSIS — E1159 Type 2 diabetes mellitus with other circulatory complications: Secondary | ICD-10-CM | POA: Diagnosis not present

## 2021-10-19 DIAGNOSIS — A419 Sepsis, unspecified organism: Secondary | ICD-10-CM | POA: Diagnosis not present

## 2021-10-19 DIAGNOSIS — M6281 Muscle weakness (generalized): Secondary | ICD-10-CM | POA: Diagnosis not present

## 2021-10-20 DIAGNOSIS — M6281 Muscle weakness (generalized): Secondary | ICD-10-CM | POA: Diagnosis not present

## 2021-10-20 DIAGNOSIS — R1312 Dysphagia, oropharyngeal phase: Secondary | ICD-10-CM | POA: Diagnosis not present

## 2021-10-20 DIAGNOSIS — E1159 Type 2 diabetes mellitus with other circulatory complications: Secondary | ICD-10-CM | POA: Diagnosis not present

## 2021-10-20 DIAGNOSIS — A419 Sepsis, unspecified organism: Secondary | ICD-10-CM | POA: Diagnosis not present

## 2021-10-20 DIAGNOSIS — R2689 Other abnormalities of gait and mobility: Secondary | ICD-10-CM | POA: Diagnosis not present

## 2021-10-20 DIAGNOSIS — R262 Difficulty in walking, not elsewhere classified: Secondary | ICD-10-CM | POA: Diagnosis not present

## 2021-10-20 DIAGNOSIS — J1081 Influenza due to other identified influenza virus with encephalopathy: Secondary | ICD-10-CM | POA: Diagnosis not present

## 2021-10-20 DIAGNOSIS — R41841 Cognitive communication deficit: Secondary | ICD-10-CM | POA: Diagnosis not present

## 2021-10-21 DIAGNOSIS — R2689 Other abnormalities of gait and mobility: Secondary | ICD-10-CM | POA: Diagnosis not present

## 2021-10-21 DIAGNOSIS — A419 Sepsis, unspecified organism: Secondary | ICD-10-CM | POA: Diagnosis not present

## 2021-10-21 DIAGNOSIS — R262 Difficulty in walking, not elsewhere classified: Secondary | ICD-10-CM | POA: Diagnosis not present

## 2021-10-21 DIAGNOSIS — R1312 Dysphagia, oropharyngeal phase: Secondary | ICD-10-CM | POA: Diagnosis not present

## 2021-10-21 DIAGNOSIS — J1081 Influenza due to other identified influenza virus with encephalopathy: Secondary | ICD-10-CM | POA: Diagnosis not present

## 2021-10-21 DIAGNOSIS — M6281 Muscle weakness (generalized): Secondary | ICD-10-CM | POA: Diagnosis not present

## 2021-10-21 DIAGNOSIS — R41841 Cognitive communication deficit: Secondary | ICD-10-CM | POA: Diagnosis not present

## 2021-10-21 DIAGNOSIS — E1159 Type 2 diabetes mellitus with other circulatory complications: Secondary | ICD-10-CM | POA: Diagnosis not present

## 2021-10-22 DIAGNOSIS — E1159 Type 2 diabetes mellitus with other circulatory complications: Secondary | ICD-10-CM | POA: Diagnosis not present

## 2021-10-22 DIAGNOSIS — J1081 Influenza due to other identified influenza virus with encephalopathy: Secondary | ICD-10-CM | POA: Diagnosis not present

## 2021-10-22 DIAGNOSIS — R262 Difficulty in walking, not elsewhere classified: Secondary | ICD-10-CM | POA: Diagnosis not present

## 2021-10-22 DIAGNOSIS — R41841 Cognitive communication deficit: Secondary | ICD-10-CM | POA: Diagnosis not present

## 2021-10-22 DIAGNOSIS — R1312 Dysphagia, oropharyngeal phase: Secondary | ICD-10-CM | POA: Diagnosis not present

## 2021-10-22 DIAGNOSIS — A419 Sepsis, unspecified organism: Secondary | ICD-10-CM | POA: Diagnosis not present

## 2021-10-22 DIAGNOSIS — M6281 Muscle weakness (generalized): Secondary | ICD-10-CM | POA: Diagnosis not present

## 2021-10-22 DIAGNOSIS — R2689 Other abnormalities of gait and mobility: Secondary | ICD-10-CM | POA: Diagnosis not present

## 2021-10-23 DIAGNOSIS — R41841 Cognitive communication deficit: Secondary | ICD-10-CM | POA: Diagnosis not present

## 2021-10-23 DIAGNOSIS — R262 Difficulty in walking, not elsewhere classified: Secondary | ICD-10-CM | POA: Diagnosis not present

## 2021-10-23 DIAGNOSIS — A419 Sepsis, unspecified organism: Secondary | ICD-10-CM | POA: Diagnosis not present

## 2021-10-23 DIAGNOSIS — R1312 Dysphagia, oropharyngeal phase: Secondary | ICD-10-CM | POA: Diagnosis not present

## 2021-10-23 DIAGNOSIS — E1159 Type 2 diabetes mellitus with other circulatory complications: Secondary | ICD-10-CM | POA: Diagnosis not present

## 2021-10-23 DIAGNOSIS — M6281 Muscle weakness (generalized): Secondary | ICD-10-CM | POA: Diagnosis not present

## 2021-10-23 DIAGNOSIS — R2689 Other abnormalities of gait and mobility: Secondary | ICD-10-CM | POA: Diagnosis not present

## 2021-10-23 DIAGNOSIS — J1081 Influenza due to other identified influenza virus with encephalopathy: Secondary | ICD-10-CM | POA: Diagnosis not present

## 2021-10-24 DIAGNOSIS — E1159 Type 2 diabetes mellitus with other circulatory complications: Secondary | ICD-10-CM | POA: Diagnosis not present

## 2021-10-24 DIAGNOSIS — J1081 Influenza due to other identified influenza virus with encephalopathy: Secondary | ICD-10-CM | POA: Diagnosis not present

## 2021-10-24 DIAGNOSIS — M6281 Muscle weakness (generalized): Secondary | ICD-10-CM | POA: Diagnosis not present

## 2021-10-24 DIAGNOSIS — R41841 Cognitive communication deficit: Secondary | ICD-10-CM | POA: Diagnosis not present

## 2021-10-24 DIAGNOSIS — R2689 Other abnormalities of gait and mobility: Secondary | ICD-10-CM | POA: Diagnosis not present

## 2021-10-24 DIAGNOSIS — R262 Difficulty in walking, not elsewhere classified: Secondary | ICD-10-CM | POA: Diagnosis not present

## 2021-10-24 DIAGNOSIS — A419 Sepsis, unspecified organism: Secondary | ICD-10-CM | POA: Diagnosis not present

## 2021-10-24 DIAGNOSIS — R1312 Dysphagia, oropharyngeal phase: Secondary | ICD-10-CM | POA: Diagnosis not present

## 2021-10-25 DIAGNOSIS — R262 Difficulty in walking, not elsewhere classified: Secondary | ICD-10-CM | POA: Diagnosis not present

## 2021-10-25 DIAGNOSIS — R41841 Cognitive communication deficit: Secondary | ICD-10-CM | POA: Diagnosis not present

## 2021-10-25 DIAGNOSIS — A419 Sepsis, unspecified organism: Secondary | ICD-10-CM | POA: Diagnosis not present

## 2021-10-25 DIAGNOSIS — J1081 Influenza due to other identified influenza virus with encephalopathy: Secondary | ICD-10-CM | POA: Diagnosis not present

## 2021-10-25 DIAGNOSIS — E1159 Type 2 diabetes mellitus with other circulatory complications: Secondary | ICD-10-CM | POA: Diagnosis not present

## 2021-10-25 DIAGNOSIS — R1312 Dysphagia, oropharyngeal phase: Secondary | ICD-10-CM | POA: Diagnosis not present

## 2021-10-25 DIAGNOSIS — R2689 Other abnormalities of gait and mobility: Secondary | ICD-10-CM | POA: Diagnosis not present

## 2021-10-25 DIAGNOSIS — M6281 Muscle weakness (generalized): Secondary | ICD-10-CM | POA: Diagnosis not present

## 2021-10-26 DIAGNOSIS — R2689 Other abnormalities of gait and mobility: Secondary | ICD-10-CM | POA: Diagnosis not present

## 2021-10-26 DIAGNOSIS — E1159 Type 2 diabetes mellitus with other circulatory complications: Secondary | ICD-10-CM | POA: Diagnosis not present

## 2021-10-26 DIAGNOSIS — R41841 Cognitive communication deficit: Secondary | ICD-10-CM | POA: Diagnosis not present

## 2021-10-26 DIAGNOSIS — R262 Difficulty in walking, not elsewhere classified: Secondary | ICD-10-CM | POA: Diagnosis not present

## 2021-10-26 DIAGNOSIS — A419 Sepsis, unspecified organism: Secondary | ICD-10-CM | POA: Diagnosis not present

## 2021-10-26 DIAGNOSIS — M6281 Muscle weakness (generalized): Secondary | ICD-10-CM | POA: Diagnosis not present

## 2021-10-26 DIAGNOSIS — J1081 Influenza due to other identified influenza virus with encephalopathy: Secondary | ICD-10-CM | POA: Diagnosis not present

## 2021-10-26 DIAGNOSIS — R1312 Dysphagia, oropharyngeal phase: Secondary | ICD-10-CM | POA: Diagnosis not present

## 2021-10-27 DIAGNOSIS — A419 Sepsis, unspecified organism: Secondary | ICD-10-CM | POA: Diagnosis not present

## 2021-10-27 DIAGNOSIS — R1312 Dysphagia, oropharyngeal phase: Secondary | ICD-10-CM | POA: Diagnosis not present

## 2021-10-27 DIAGNOSIS — M6281 Muscle weakness (generalized): Secondary | ICD-10-CM | POA: Diagnosis not present

## 2021-10-27 DIAGNOSIS — R2689 Other abnormalities of gait and mobility: Secondary | ICD-10-CM | POA: Diagnosis not present

## 2021-10-27 DIAGNOSIS — R262 Difficulty in walking, not elsewhere classified: Secondary | ICD-10-CM | POA: Diagnosis not present

## 2021-10-27 DIAGNOSIS — J1081 Influenza due to other identified influenza virus with encephalopathy: Secondary | ICD-10-CM | POA: Diagnosis not present

## 2021-10-27 DIAGNOSIS — R41841 Cognitive communication deficit: Secondary | ICD-10-CM | POA: Diagnosis not present

## 2021-10-27 DIAGNOSIS — E1159 Type 2 diabetes mellitus with other circulatory complications: Secondary | ICD-10-CM | POA: Diagnosis not present

## 2021-10-31 DIAGNOSIS — R262 Difficulty in walking, not elsewhere classified: Secondary | ICD-10-CM | POA: Diagnosis not present

## 2021-10-31 DIAGNOSIS — E1159 Type 2 diabetes mellitus with other circulatory complications: Secondary | ICD-10-CM | POA: Diagnosis not present

## 2021-10-31 DIAGNOSIS — R2689 Other abnormalities of gait and mobility: Secondary | ICD-10-CM | POA: Diagnosis not present

## 2021-10-31 DIAGNOSIS — M6281 Muscle weakness (generalized): Secondary | ICD-10-CM | POA: Diagnosis not present

## 2021-10-31 DIAGNOSIS — R41841 Cognitive communication deficit: Secondary | ICD-10-CM | POA: Diagnosis not present

## 2021-10-31 DIAGNOSIS — R1312 Dysphagia, oropharyngeal phase: Secondary | ICD-10-CM | POA: Diagnosis not present

## 2021-10-31 DIAGNOSIS — A419 Sepsis, unspecified organism: Secondary | ICD-10-CM | POA: Diagnosis not present

## 2021-10-31 DIAGNOSIS — J1081 Influenza due to other identified influenza virus with encephalopathy: Secondary | ICD-10-CM | POA: Diagnosis not present

## 2021-11-04 DIAGNOSIS — J1081 Influenza due to other identified influenza virus with encephalopathy: Secondary | ICD-10-CM | POA: Diagnosis not present

## 2021-11-04 DIAGNOSIS — R41841 Cognitive communication deficit: Secondary | ICD-10-CM | POA: Diagnosis not present

## 2021-11-04 DIAGNOSIS — R262 Difficulty in walking, not elsewhere classified: Secondary | ICD-10-CM | POA: Diagnosis not present

## 2021-11-04 DIAGNOSIS — M6281 Muscle weakness (generalized): Secondary | ICD-10-CM | POA: Diagnosis not present

## 2021-11-04 DIAGNOSIS — R2689 Other abnormalities of gait and mobility: Secondary | ICD-10-CM | POA: Diagnosis not present

## 2021-11-04 DIAGNOSIS — E1159 Type 2 diabetes mellitus with other circulatory complications: Secondary | ICD-10-CM | POA: Diagnosis not present

## 2021-11-04 DIAGNOSIS — A419 Sepsis, unspecified organism: Secondary | ICD-10-CM | POA: Diagnosis not present

## 2021-11-04 DIAGNOSIS — R1312 Dysphagia, oropharyngeal phase: Secondary | ICD-10-CM | POA: Diagnosis not present

## 2021-11-05 DIAGNOSIS — R5381 Other malaise: Secondary | ICD-10-CM | POA: Diagnosis not present

## 2021-11-05 DIAGNOSIS — E119 Type 2 diabetes mellitus without complications: Secondary | ICD-10-CM | POA: Diagnosis not present

## 2021-11-05 DIAGNOSIS — I10 Essential (primary) hypertension: Secondary | ICD-10-CM | POA: Diagnosis not present

## 2021-11-05 DIAGNOSIS — E512 Wernicke's encephalopathy: Secondary | ICD-10-CM | POA: Diagnosis not present

## 2021-11-12 DIAGNOSIS — Z7409 Other reduced mobility: Secondary | ICD-10-CM | POA: Diagnosis not present

## 2021-11-12 DIAGNOSIS — I10 Essential (primary) hypertension: Secondary | ICD-10-CM | POA: Diagnosis not present

## 2021-12-10 DIAGNOSIS — H524 Presbyopia: Secondary | ICD-10-CM | POA: Diagnosis not present

## 2021-12-10 DIAGNOSIS — H2513 Age-related nuclear cataract, bilateral: Secondary | ICD-10-CM | POA: Diagnosis not present

## 2021-12-10 DIAGNOSIS — E119 Type 2 diabetes mellitus without complications: Secondary | ICD-10-CM | POA: Diagnosis not present

## 2021-12-17 DIAGNOSIS — E512 Wernicke's encephalopathy: Secondary | ICD-10-CM | POA: Diagnosis not present

## 2021-12-17 DIAGNOSIS — E119 Type 2 diabetes mellitus without complications: Secondary | ICD-10-CM | POA: Diagnosis not present

## 2021-12-17 DIAGNOSIS — R634 Abnormal weight loss: Secondary | ICD-10-CM | POA: Diagnosis not present

## 2021-12-17 DIAGNOSIS — I10 Essential (primary) hypertension: Secondary | ICD-10-CM | POA: Diagnosis not present

## 2022-01-19 DIAGNOSIS — E1159 Type 2 diabetes mellitus with other circulatory complications: Secondary | ICD-10-CM | POA: Diagnosis not present

## 2022-01-19 DIAGNOSIS — I152 Hypertension secondary to endocrine disorders: Secondary | ICD-10-CM | POA: Diagnosis not present

## 2022-01-23 DIAGNOSIS — E1159 Type 2 diabetes mellitus with other circulatory complications: Secondary | ICD-10-CM | POA: Diagnosis not present

## 2022-01-23 DIAGNOSIS — I152 Hypertension secondary to endocrine disorders: Secondary | ICD-10-CM | POA: Diagnosis not present

## 2022-01-23 IMAGING — CT CT HEAD W/O CM
3 series · 16 of 47 positions shown, 19 images · non-contrast
Comparison: MRI head dated June 24, 2021

CLINICAL DATA: Mental status change

EXAM:
CT HEAD WITHOUT CONTRAST
TECHNIQUE: Contiguous axial images were obtained from the base of the skull
through the vertex without intravenous contrast.

[Series 3: head 5.0 h30s · axial · 0.43mm/px · z∈[-148,-18]mm · 10 of 32 slices shown, 13 images]
[im 3/32  brain]
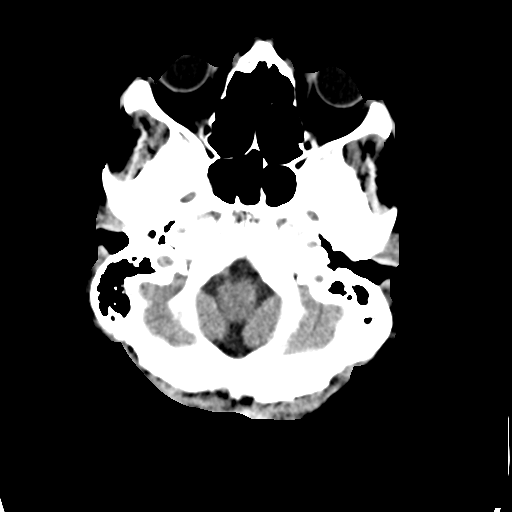
[im 3/32  bone]
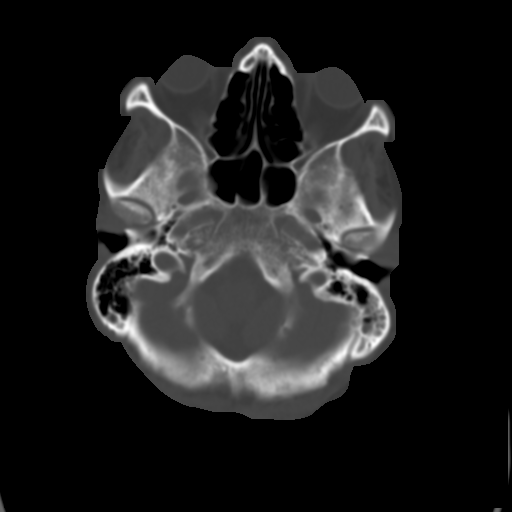
[im 6/32  brain]
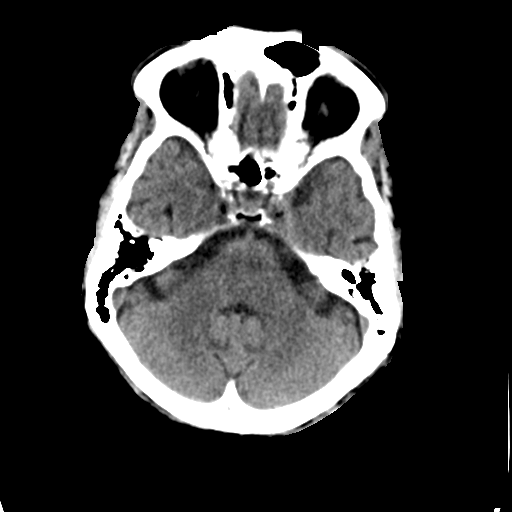
[im 9/32  brain]
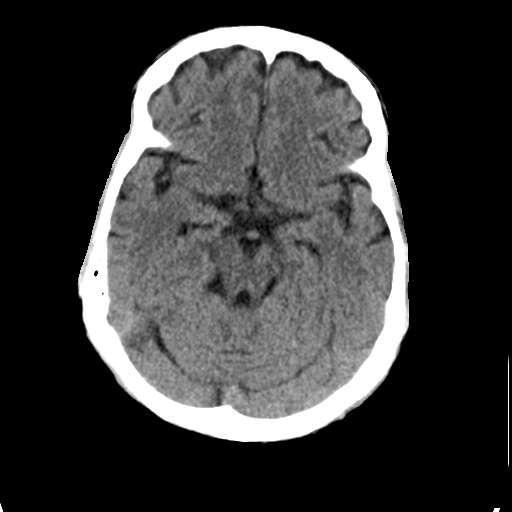
[im 11/32  brain]
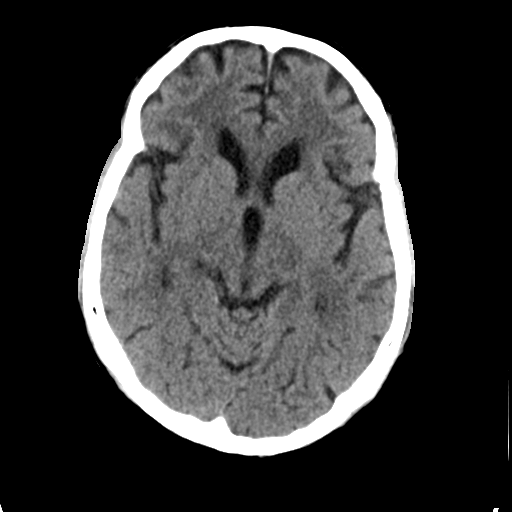
[im 14/32  brain]
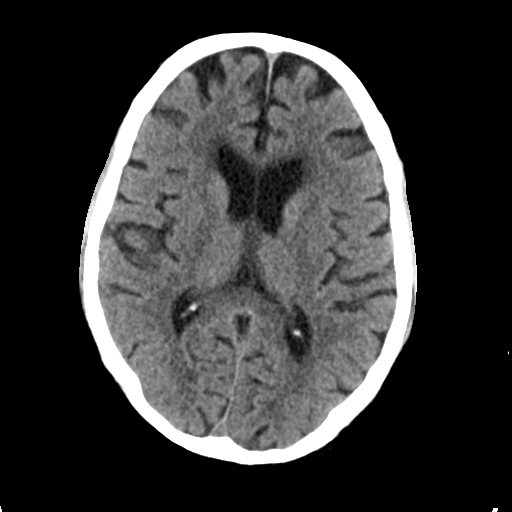
[im 14/32  bone]
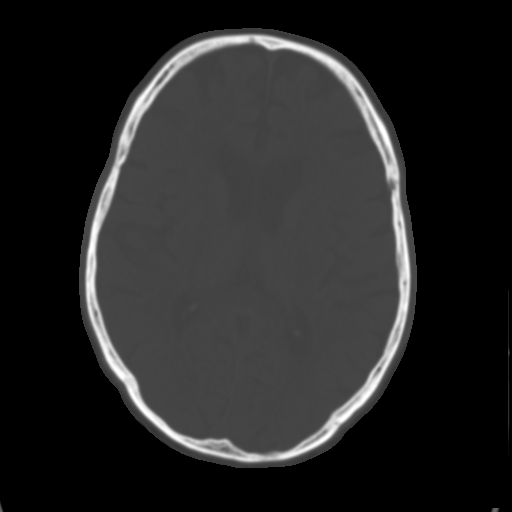
[im 18/32  brain]
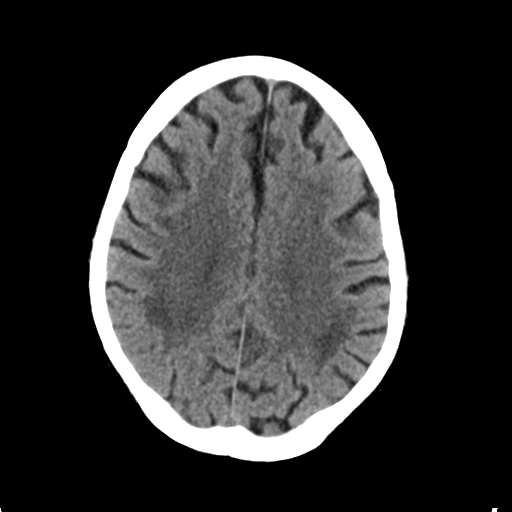
[im 21/32  brain]
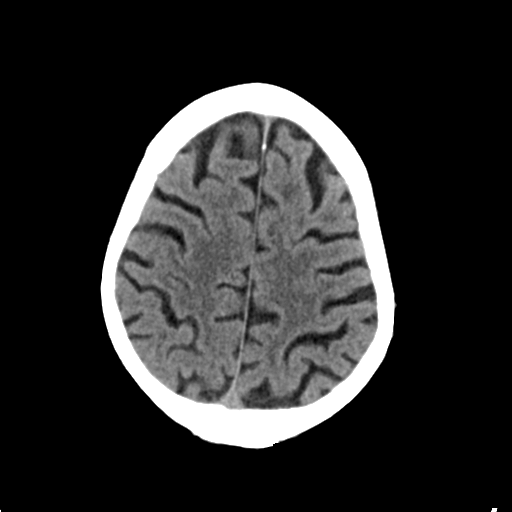
[im 24/32  brain]
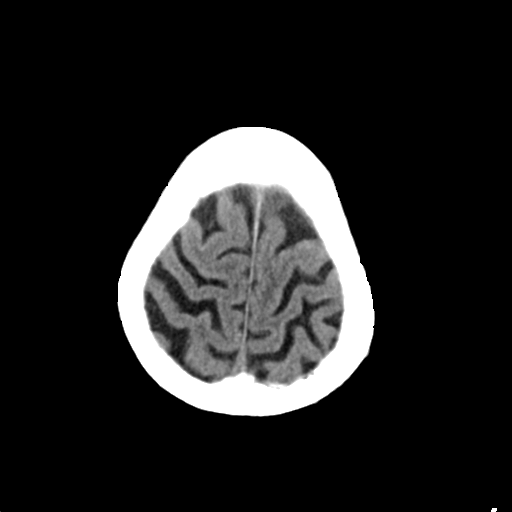
[im 26/32  brain]
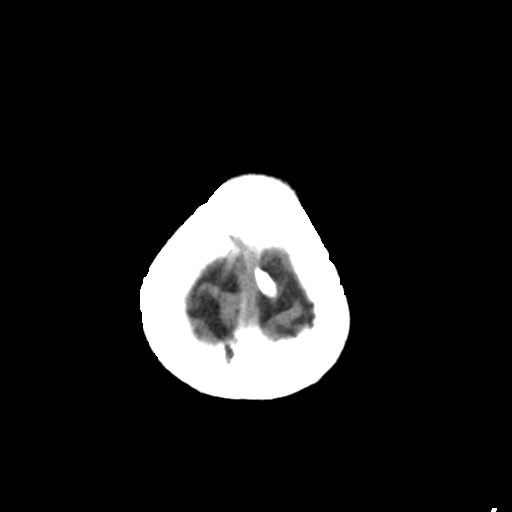
[im 26/32  bone]
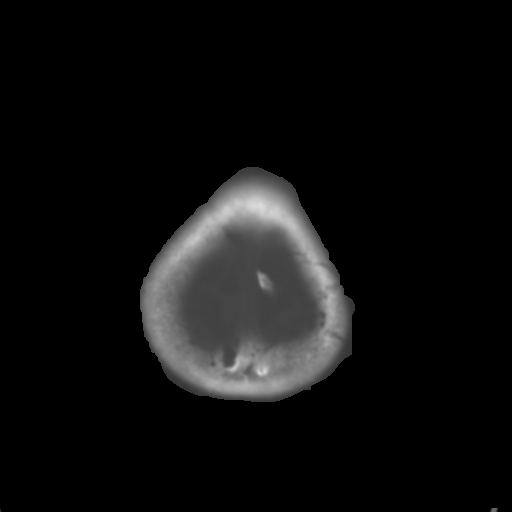
[im 29/32  brain]
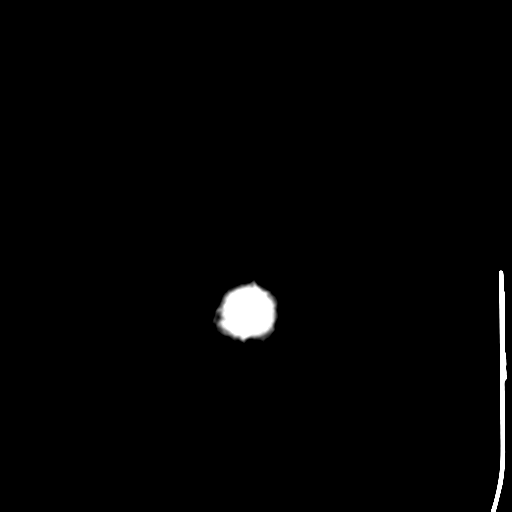

[Series 5: head 3.0 mpr cor · coronal · 0.31mm/px · 3 of 67 slices shown]
[im 23/67  brain]
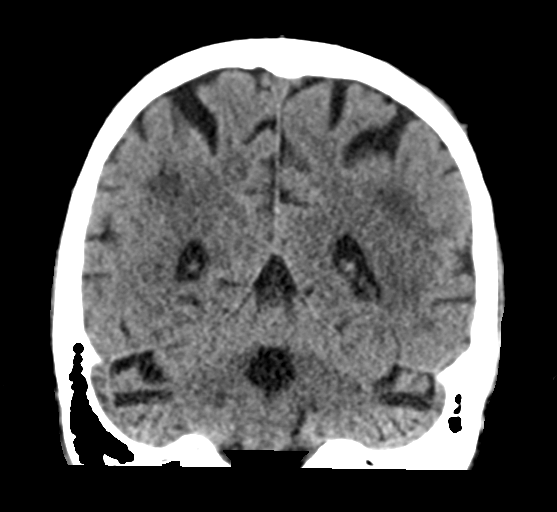
[im 30/67  brain]
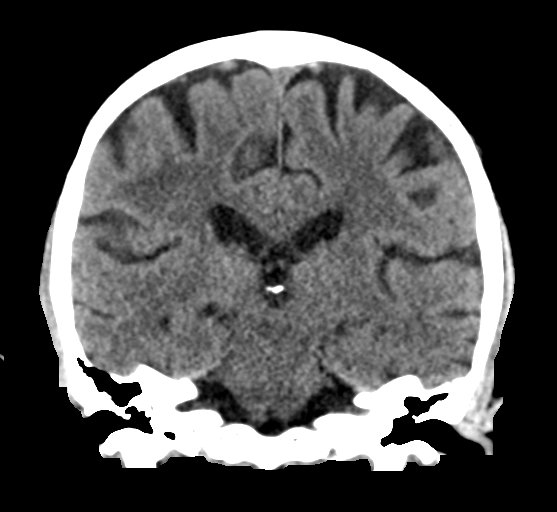
[im 37/67  brain]
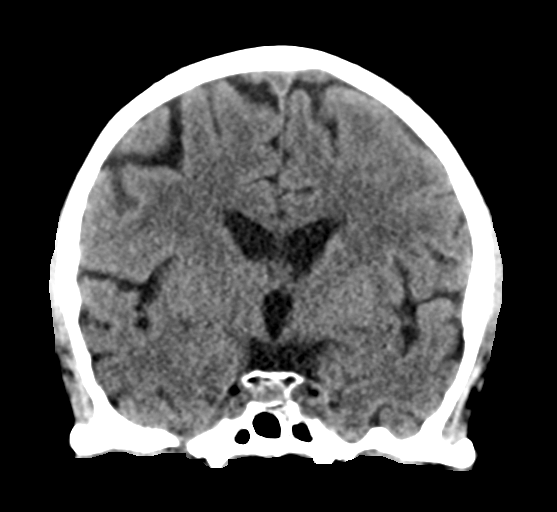

[Series 6: head 3.0 mpr sag · sagittal · 0.31mm/px · 3 of 61 slices shown]
[im 21/61  brain]
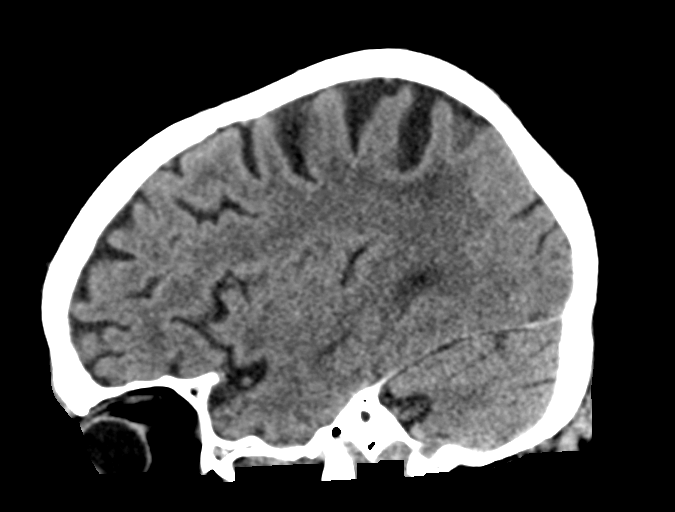
[im 31/61  brain]
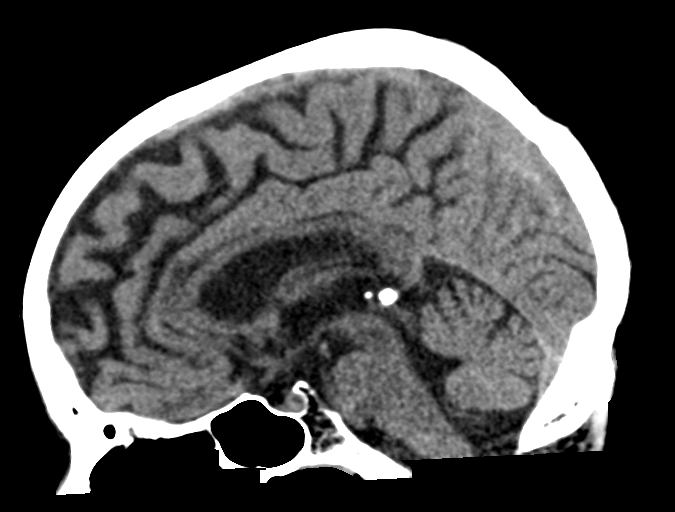
[im 41/61  brain]
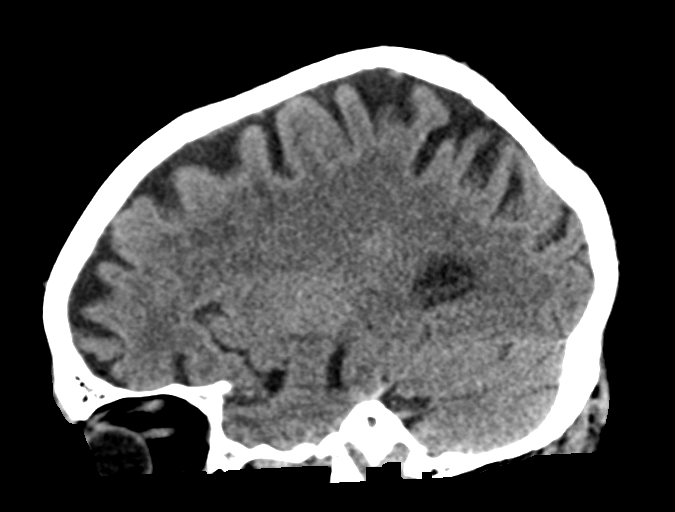

[16 of 47 positions shown; findings below may reference images not displayed]

FINDINGS: Brain: Chronic white matter ischemic change. No evidence of acute
infarction, hemorrhage, hydrocephalus, extra-axial collection or
mass lesion/mass effect.

Vascular: No hyperdense vessel or unexpected calcification.

Skull: Normal. Negative for fracture or focal lesion.

Sinuses/Orbits: No acute finding.

Other: None.
IMPRESSION: No acute intracranial abnormality.

## 2022-01-23 IMAGING — DX DG CHEST 2V
2 series · 2 of 2 positions shown · non-contrast
Comparison: 07/29/2021

CLINICAL DATA: Weakness. Lightheadedness/near syncope. Recent
pneumonia.

EXAM:
CHEST - 2 VIEW

[chest lat]
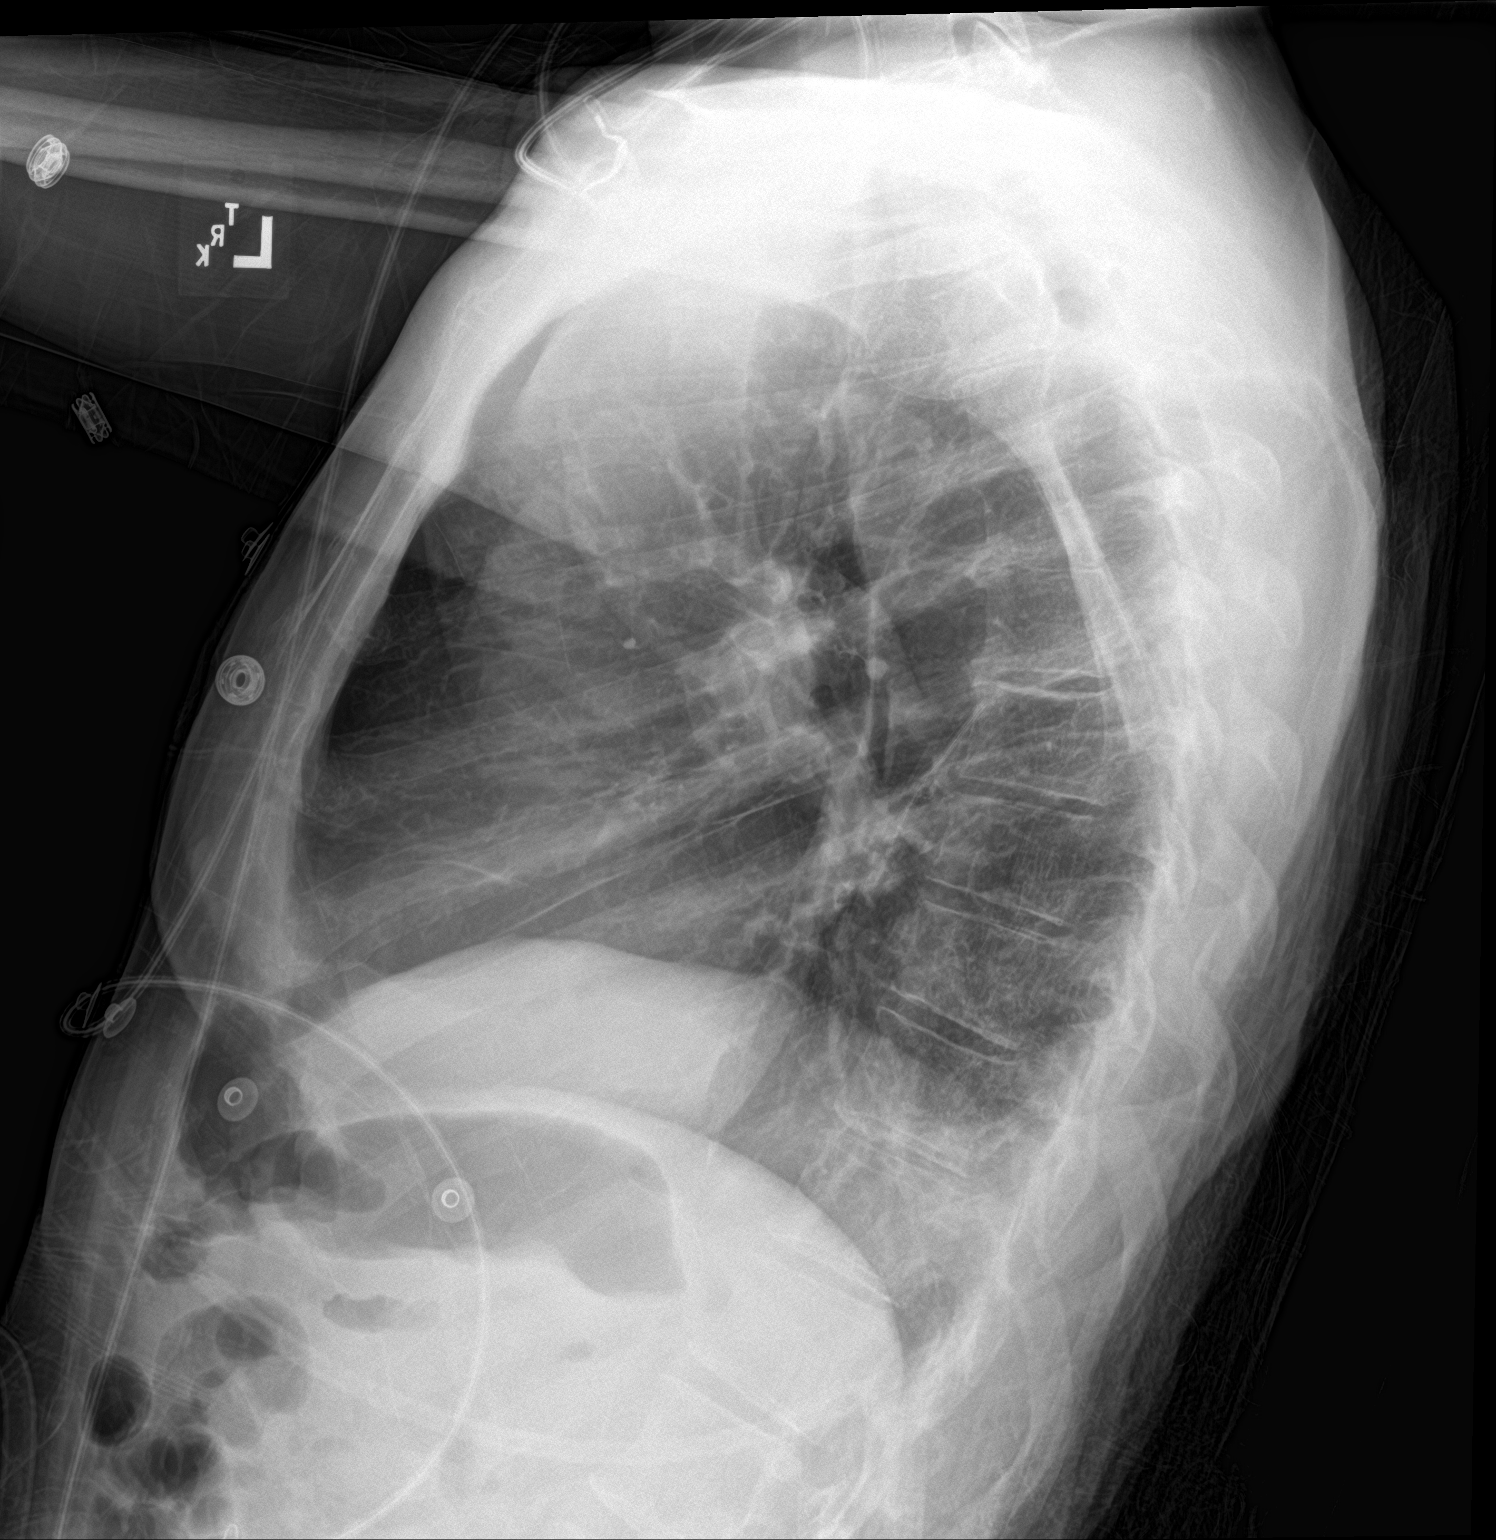

[chest ap]
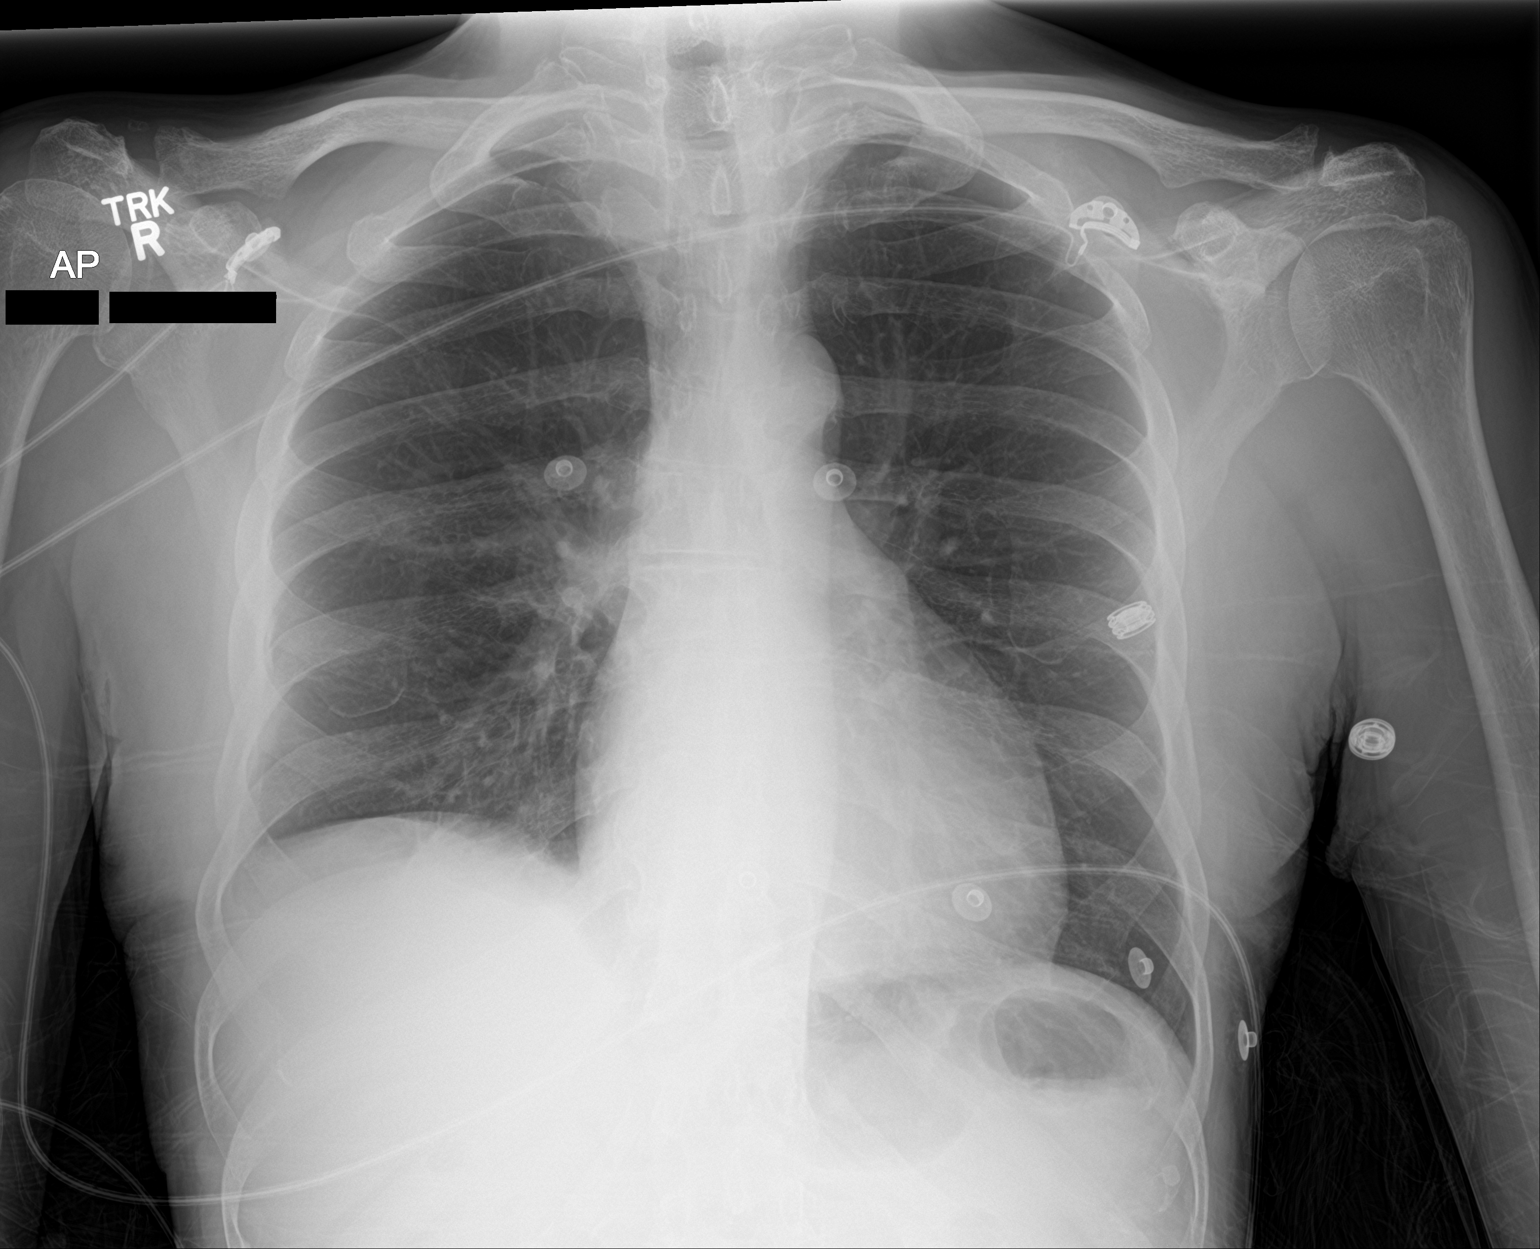

[2 of 2 positions shown; findings below may reference images not displayed]

FINDINGS: The cardiomediastinal silhouette is unchanged with normal heart
size. The lungs are better inflated than on the prior study. There
is unchanged mild elevation of the right hemidiaphragm. Mild right
basilar opacity has improved from the prior study and likely
reflects atelectasis. There is unchanged blunting of the right
costophrenic angle suggestive of either a small residual pleural
effusion and/or pleural thickening. The left lung is clear. No
pneumothorax is identified. No acute osseous abnormality is seen.
IMPRESSION: Improved aeration of the right lung base with mild atelectasis.
Small residual right-sided pleural effusion or pleural thickening.

## 2022-01-29 DIAGNOSIS — I10 Essential (primary) hypertension: Secondary | ICD-10-CM | POA: Diagnosis not present

## 2022-01-29 DIAGNOSIS — S78112A Complete traumatic amputation at level between left hip and knee, initial encounter: Secondary | ICD-10-CM | POA: Diagnosis not present

## 2022-01-29 DIAGNOSIS — E119 Type 2 diabetes mellitus without complications: Secondary | ICD-10-CM | POA: Diagnosis not present

## 2022-01-29 DIAGNOSIS — R3981 Functional urinary incontinence: Secondary | ICD-10-CM | POA: Diagnosis not present
